# Patient Record
Sex: Male | Born: 1950 | Race: Black or African American | Hispanic: No | Smoking: Former smoker
Health system: Southern US, Community
[De-identification: ages and names within clinical notes are randomized; demographics above are authoritative.]

## PROBLEM LIST (undated history)

## (undated) DIAGNOSIS — M1990 Unspecified osteoarthritis, unspecified site: Secondary | ICD-10-CM

## (undated) DIAGNOSIS — K219 Gastro-esophageal reflux disease without esophagitis: Secondary | ICD-10-CM

## (undated) DIAGNOSIS — J3489 Other specified disorders of nose and nasal sinuses: Secondary | ICD-10-CM

## (undated) DIAGNOSIS — I10 Essential (primary) hypertension: Secondary | ICD-10-CM

## (undated) DIAGNOSIS — H269 Unspecified cataract: Secondary | ICD-10-CM

## (undated) DIAGNOSIS — C801 Malignant (primary) neoplasm, unspecified: Secondary | ICD-10-CM

## (undated) DIAGNOSIS — E785 Hyperlipidemia, unspecified: Secondary | ICD-10-CM

## (undated) HISTORY — DX: Essential (primary) hypertension: I10

## (undated) HISTORY — DX: Hyperlipidemia, unspecified: E78.5

## (undated) HISTORY — PX: WISDOM TOOTH EXTRACTION: SHX21

## (undated) HISTORY — PX: TONSILLECTOMY AND ADENOIDECTOMY: SUR1326

---

## 2015-01-15 DIAGNOSIS — R7301 Impaired fasting glucose: Secondary | ICD-10-CM | POA: Diagnosis not present

## 2015-01-15 DIAGNOSIS — Z0000 Encounter for general adult medical examination without abnormal findings: Secondary | ICD-10-CM | POA: Diagnosis not present

## 2015-01-15 DIAGNOSIS — I10 Essential (primary) hypertension: Secondary | ICD-10-CM | POA: Diagnosis not present

## 2015-01-15 DIAGNOSIS — N400 Enlarged prostate without lower urinary tract symptoms: Secondary | ICD-10-CM | POA: Diagnosis not present

## 2015-01-15 LAB — PSA

## 2015-09-15 ENCOUNTER — Other Ambulatory Visit: Payer: Self-pay | Admitting: Unknown Physician Specialty

## 2015-09-15 MED ORDER — LISINOPRIL-HYDROCHLOROTHIAZIDE 20-12.5 MG PO TABS: 1.0000 | ORAL_TABLET | Freq: Every day | ORAL | Status: DC

## 2015-09-15 NOTE — Telephone Encounter (Signed)
Needs f/u.

## 2015-09-15 NOTE — Telephone Encounter (Signed)
Pt needs refill for lisinopril sent to [facility] court

## 2015-09-15 NOTE — Telephone Encounter (Signed)
Called and scheduled patient an appointment for 10/10/15.

## 2015-09-15 NOTE — Telephone Encounter (Signed)
Patient was last seen 01/15/15, practice partner number is [id number], and pharmacy is [facility].

## 2015-09-25 DIAGNOSIS — I10 Essential (primary) hypertension: Secondary | ICD-10-CM | POA: Insufficient documentation

## 2015-09-25 DIAGNOSIS — R338 Other retention of urine: Secondary | ICD-10-CM

## 2015-09-25 DIAGNOSIS — R7301 Impaired fasting glucose: Secondary | ICD-10-CM | POA: Insufficient documentation

## 2015-09-25 DIAGNOSIS — N401 Enlarged prostate with lower urinary tract symptoms: Secondary | ICD-10-CM | POA: Insufficient documentation

## 2015-09-26 ENCOUNTER — Other Ambulatory Visit: Payer: Self-pay | Admitting: Unknown Physician Specialty

## 2015-09-27 NOTE — Telephone Encounter (Signed)
Jared Herrera just approved this Rx earlier this month Please resolve her prescription with pharmacy Thank you

## 2015-09-29 NOTE — Telephone Encounter (Signed)
Called pharmacy and they stated that they did get the prescription on 09/15/15 for #30 with 1 refill.

## 2015-10-10 ENCOUNTER — Ambulatory Visit (INDEPENDENT_AMBULATORY_CARE_PROVIDER_SITE_OTHER): Payer: Medicare Other | Admitting: Unknown Physician Specialty

## 2015-10-10 ENCOUNTER — Encounter: Payer: Self-pay | Admitting: Unknown Physician Specialty

## 2015-10-10 VITALS — BP 136/87 | HR 96 | Temp 98.3°F | Ht 68.0 in | Wt 211.4 lb

## 2015-10-10 DIAGNOSIS — N401 Enlarged prostate with lower urinary tract symptoms: Secondary | ICD-10-CM

## 2015-10-10 DIAGNOSIS — I10 Essential (primary) hypertension: Secondary | ICD-10-CM | POA: Diagnosis not present

## 2015-10-10 DIAGNOSIS — Z23 Encounter for immunization: Secondary | ICD-10-CM | POA: Diagnosis not present

## 2015-10-10 DIAGNOSIS — N400 Enlarged prostate without lower urinary tract symptoms: Secondary | ICD-10-CM

## 2015-10-10 DIAGNOSIS — R972 Elevated prostate specific antigen [PSA]: Secondary | ICD-10-CM

## 2015-10-10 DIAGNOSIS — R338 Other retention of urine: Secondary | ICD-10-CM

## 2015-10-10 MED ORDER — LISINOPRIL-HYDROCHLOROTHIAZIDE 20-12.5 MG PO TABS: 1.0000 | ORAL_TABLET | Freq: Every day | ORAL | Status: DC

## 2015-10-10 NOTE — Progress Notes (Addendum)
   BP 136/87 mmHg  Pulse 96  Temp(Src) 98.3 F (36.8 C)  Ht 5\' 8"  (1.727 m)  Wt 211 lb 6.4 oz (95.89 kg)  BMI 32.15 kg/m2  SpO2 97%   Subjective:    Patient ID: Jared Herrera, male    DOB: Jan 16, 1951, 64 y.o.   MRN: [id number]  HPI: Jared Herrera is a 64 y.o. male  Chief Complaint  Patient presents with  . Hypertension   Hypertension Using medications without difficulty Average home BP: Does not check  No problems or lightheadedness No chest pain with exertion or shortness of breath No Edema  Elevated PSA Noted last visit.  Lost to 6 month f/u   Relevant past medical, surgical, family and social history reviewed and updated as indicated. Interim medical history since our last visit reviewed. Allergies and medications reviewed and updated.  Review of Systems  Per HPI unless specifically indicated above     Objective:    BP 136/87 mmHg  Pulse 96  Temp(Src) 98.3 F (36.8 C)  Ht 5\' 8"  (1.727 m)  Wt 211 lb 6.4 oz (95.89 kg)  BMI 32.15 kg/m2  SpO2 97%  Wt Readings from Last 3 Encounters:  10/10/15 211 lb 6.4 oz (95.89 kg)  01/15/15 219 lb (99.338 kg)    Physical Exam  Constitutional: He is oriented to person, place, and time. He appears well-developed and well-nourished. No distress.  HENT:  Head: Normocephalic and atraumatic.  Eyes: Conjunctivae and lids are normal. Right eye exhibits no discharge. Left eye exhibits no discharge. No scleral icterus.  Neck: Normal range of motion. Neck supple. No JVD present. Carotid bruit is not present.  Cardiovascular: Normal rate, regular rhythm and normal heart sounds.   Pulmonary/Chest: Effort normal and breath sounds normal. No respiratory distress.  Abdominal: Normal appearance. There is no splenomegaly or hepatomegaly.  Musculoskeletal: Normal range of motion.  Neurological: He is alert and oriented to person, place, and time.  Skin: Skin is warm, dry and intact. No rash noted. No pallor.  Psychiatric: He has  a normal mood and affect. His behavior is normal. Judgment and thought content normal.      Assessment & Plan:   Problem List Items Addressed This Visit      Unprioritized   Hypertension    Stable, continue present medications.        Relevant Medications   lisinopril-hydrochlorothiazide (PRINZIDE,ZESTORETIC) 20-12.5 MG tablet   BPH (benign prostatic hypertrophy) with urinary retention    Recheck PSA       Other Visit Diagnoses    Immunization due    -  Primary    Relevant Orders    Flu Vaccine QUAD 36+ mos IM (Completed)    Elevated PSA        Relevant Orders    PSA        Follow up plan: Return in about 4 months (around 02/08/2016) for physical.

## 2015-10-10 NOTE — Assessment & Plan Note (Signed)
Stable, continue present medications.   

## 2015-10-10 NOTE — Addendum Note (Signed)
Addended by: Kathrine Haddock on: 10/10/2015 10:48 AM   Modules accepted: Orders, SmartSet

## 2015-10-10 NOTE — Assessment & Plan Note (Signed)
Recheck PSA 

## 2015-10-11 LAB — PSA: PROSTATE SPECIFIC AG, SERUM: 6.5 ng/mL — AB (ref 0.0–4.0)

## 2015-10-14 ENCOUNTER — Other Ambulatory Visit: Payer: Self-pay | Admitting: Unknown Physician Specialty

## 2015-10-14 DIAGNOSIS — R972 Elevated prostate specific antigen [PSA]: Secondary | ICD-10-CM

## 2015-10-14 NOTE — Progress Notes (Signed)
Refer to urology fr elevated PSA

## 2015-10-14 NOTE — Addendum Note (Signed)
Addended by: Kathrine Haddock on: 10/14/2015 10:33 AM   Modules accepted: Miquel Dunn

## 2015-10-15 ENCOUNTER — Encounter: Payer: Self-pay | Admitting: Urology

## 2015-10-15 ENCOUNTER — Ambulatory Visit (INDEPENDENT_AMBULATORY_CARE_PROVIDER_SITE_OTHER): Payer: Medicare Other | Admitting: Urology

## 2015-10-15 VITALS — BP 126/88 | HR 80 | Temp 97.8°F | Resp 16 | Ht 68.0 in | Wt 213.0 lb

## 2015-10-15 DIAGNOSIS — R972 Elevated prostate specific antigen [PSA]: Secondary | ICD-10-CM | POA: Diagnosis not present

## 2015-10-15 LAB — URINALYSIS, COMPLETE
Bilirubin, UA: NEGATIVE
Glucose, UA: NEGATIVE
Ketones, UA: NEGATIVE
LEUKOCYTES UA: NEGATIVE
Nitrite, UA: NEGATIVE
PH UA: 5.5 (ref 5.0–7.5)
PROTEIN UA: NEGATIVE
Specific Gravity, UA: 1.025 (ref 1.005–1.030)
Urobilinogen, Ur: 0.2 mg/dL (ref 0.2–1.0)

## 2015-10-15 LAB — MICROSCOPIC EXAMINATION

## 2015-10-15 NOTE — Progress Notes (Signed)
10/15/2015 11:44 AM   Jared Herrera [birth year] [id number]  Referring provider: Kathrine Haddock, NP [place], [place] [address]  Chief Complaint  Patient presents with  . Elevated PSA    new Patient    HPI: [name] with elevated PSA to 6.5 from 4.6 only 9 months ago.  He denies a history of previous prostate biopsies. He denies a personal history of known elevated PSA or workup of this.  No personal history of abnormal rectal exams per patient.  No family history of prostate cancer.    Denies any urinary complaints including no hematuria, dysuria, frequency, urgency, sensation of complete bladder emtpying or nocturia.  Good urinary flow.     No weight loss or bone pain.  He does take baby aspirin for prophylaxis.   PMH: Past Medical History  Diagnosis Date  . Hypertension     Surgical History: Past Surgical History  Procedure Laterality Date  . Tonsillectomy and adenoidectomy      Home Medications:    Medication List       This list is accurate as of: 10/15/15 11:44 AM.  Always use your most recent med list.               aspirin 81 MG tablet  Take 81 mg by mouth daily.     lisinopril-hydrochlorothiazide 20-12.5 MG tablet  Commonly known as:  PRINZIDE,ZESTORETIC  Take 1 tablet by mouth daily.     loratadine 10 MG tablet  Commonly known as:  CLARITIN  Take 10 mg by mouth daily.     multivitamin tablet  Take 1 tablet by mouth daily.     ranitidine 150 MG tablet  Commonly known as:  ZANTAC  Take 150 mg by mouth daily.        Allergies: No Known Allergies  Family History: Family History  Problem Relation Age of Onset  . Heart disease Brother   . Hypertension Brother   . Hypertension Mother   . Kidney disease Mother   . Cancer Sister     thyroid  . Lupus Daughter   . Kidney disease Daughter     Social History:  reports that he has quit smoking. He has never used smokeless tobacco. He reports that he drinks alcohol. He reports that  he does not use illicit drugs.  ROS: UROLOGY Frequent Urination?: No Hard to postpone urination?: No Burning/pain with urination?: No Get up at night to urinate?: No Leakage of urine?: No Urine stream starts and stops?: No Trouble starting stream?: No Do you have to strain to urinate?: No Blood in urine?: No Urinary tract infection?: No Sexually transmitted disease?: No Injury to kidneys or bladder?: No Painful intercourse?: No Weak stream?: No Erection problems?: No Penile pain?: No  Gastrointestinal Nausea?: No Vomiting?: No Indigestion/heartburn?: No Diarrhea?: No Constipation?: No  Constitutional Fever: No Night sweats?: No Weight loss?: No Fatigue?: No  Skin Skin rash/lesions?: No Itching?: No  Eyes Blurred vision?: No Double vision?: No  Ears/Nose/Throat Sore throat?: No Sinus problems?: Yes  Hematologic/Lymphatic Swollen glands?: No Easy bruising?: No  Cardiovascular Leg swelling?: No Chest pain?: No  Respiratory Cough?: No Shortness of breath?: No  Endocrine Excessive thirst?: No  Musculoskeletal Back pain?: No Joint pain?: Yes  Neurological Headaches?: No Dizziness?: No  Psychologic Depression?: No Anxiety?: No  Physical Exam: BP 126/88 mmHg  Pulse 80  Temp(Src) 97.8 F (36.6 C)  Resp 16  Ht 5\' 8"  (1.727 m)  Wt 213 lb (96.616 kg)  BMI 32.39 kg/m2  Constitutional:  Alert and oriented, No acute distress. HEENT: [place] AT, moist mucus membranes.  Trachea midline, no masses. Cardiovascular: No clubbing, cyanosis, or edema. Respiratory: Normal respiratory effort, no increased work of breathing. GI: Abdomen is soft, nontender, nondistended, no abdominal masses GU: No CVA tenderness.  Recta: Normal sphincter tone, no hemorrhoids.  Enlarged 40+ cc gland, rubbery, no nodules.    Skin: No rashes, bruises or suspicious lesions. Lymph: No cervical or inguinal adenopathy. Neurologic: Grossly intact, no focal deficits, moving all 4  extremities. Psychiatric: Normal mood and affect.  Laboratory Data:  Lab Results  Component Value Date   PSA 6.5* 10/10/2015   PS[facility] from [facility] 01/15/2015    Urinalysis UA negative  Pertinent Imaging: n/a  Assessment & Plan:  A 73-year-old African-American male with elevated PSA to 6.5 up from previous 4.6 9 months ago.  DRE consistent with BPH, no nodules.  1. Elevated PSA  We reviewed the implications of an elevated PSA and the uncertainty surrounding it. In general, a man's PSA increases with age and is produced by both normal and cancerous prostate tissue. Differential for elevated PSA is BPH, prostate cancer, infection, recent intercourse/ejaculation, prostate infarction, recent urethroscopic manipulation (foley placement/cystoscopy) and prostatitis. Management of an elevated PSA can include observation or prostate biopsy and wediscussed this in detail. We discussed that indications for prostate biopsy are defined by age and race specific PSA cutoffs as well as a PSA velocity of 0.75/year.  We discussed prostate biopsy in detail including the procedure itself, the risks of blood in the urine, stool, and ejaculate, serious infection, and discomfort. He is willing to proceed with this as discussed.  Plan for repeat PSA today, will call with results.  If remains elevated, recommend f/u prostate biopsy.    - Urinalysis, Complete - PSA   Return in about 2 weeks (around 10/29/2015) for  prostate biopsy.  Hollice Espy, MD  [place] [address] [place], [place] [address] [phone]  I spent 30 min with this patient of which greater than 50% was spent in counseling and coordination of care with the patient.   All of his questions were answered in detail.

## 2015-10-15 NOTE — Patient Instructions (Signed)

## 2015-10-16 ENCOUNTER — Telehealth: Payer: Self-pay

## 2015-10-16 LAB — PSA: Prostate Specific Ag, Serum: 6.2 ng/mL — ABNORMAL HIGH (ref 0.0–4.0)

## 2015-10-16 NOTE — Telephone Encounter (Signed)
-----   Message from Hollice Espy, MD sent at 10/16/2015  8:01 AM EST ----- Please let this gentleman know we are going to proceed with biopsy as planned.  Repeat PSA 6.2.    Hollice Espy, MD

## 2015-10-16 NOTE — Telephone Encounter (Signed)
Spoke with pt in reference to PSA and prostate bx. Pt voiced understanding.  

## 2015-11-11 ENCOUNTER — Ambulatory Visit (INDEPENDENT_AMBULATORY_CARE_PROVIDER_SITE_OTHER): Payer: Medicare Other | Admitting: Urology

## 2015-11-11 ENCOUNTER — Other Ambulatory Visit: Payer: Self-pay | Admitting: Urology

## 2015-11-11 ENCOUNTER — Encounter: Payer: Self-pay | Admitting: Urology

## 2015-11-11 VITALS — BP 171/88 | HR 103 | Temp 97.9°F | Ht 69.0 in | Wt 212.6 lb

## 2015-11-11 DIAGNOSIS — R972 Elevated prostate specific antigen [PSA]: Secondary | ICD-10-CM | POA: Diagnosis not present

## 2015-11-11 DIAGNOSIS — C61 Malignant neoplasm of prostate: Secondary | ICD-10-CM | POA: Diagnosis not present

## 2015-11-11 MED ORDER — LEVOFLOXACIN 500 MG PO TABS: 500.0000 mg | ORAL_TABLET | Freq: Once | ORAL | Status: DC

## 2015-11-11 MED ORDER — GENTAMICIN SULFATE 40 MG/ML IJ SOLN: 80.0000 mg | Freq: Once | INTRAMUSCULAR | Status: DC

## 2015-11-11 NOTE — Progress Notes (Signed)
12:00 PM  11/11/2015   Jared Herrera 31-May-1951 [id number]  Referring provider: Kathrine Haddock, NP [place], [place] [address]  Chief Complaint  Patient presents with  . Biopsy    Prostate Biopsy    HPI: [name] with elevated PSA who presents to the office today for a prostate biopsy.   His PSA was noted to rise to 6.5 from 4.6 only 9 months ago. Repeat PSA 6.2.   He denies a history of previous prostate biopsies. He denies a personal history of known elevated PSA or workup of this.  No personal history of abnormal rectal exams per patient.  No family history of prostate cancer.    Denies any urinary complaints including no hematuria, dysuria, frequency, urgency, sensation of complete bladder emtpying or nocturia.  Good urinary flow.     No weight loss or bone pain.  He does take baby aspirin for prophylaxis.   PMH: Past Medical History  Diagnosis Date  . Hypertension     Surgical History: Past Surgical History  Procedure Laterality Date  . Tonsillectomy and adenoidectomy      Home Medications:    Medication List       This list is accurate as of: 11/11/15 12:00 PM.  Always use your most recent med list.               aspirin 81 MG tablet  Take 81 mg by mouth daily. Reported on 11/11/2015     lisinopril-hydrochlorothiazide 20-12.5 MG tablet  Commonly known as:  PRINZIDE,ZESTORETIC  Take 1 tablet by mouth daily.     loratadine 10 MG tablet  Commonly known as:  CLARITIN  Take 10 mg by mouth daily.     multivitamin tablet  Take 1 tablet by mouth daily. Reported on 11/11/2015     ranitidine 150 MG tablet  Commonly known as:  ZANTAC  Take 150 mg by mouth daily.        Allergies: No Known Allergies  Family History: Family History  Problem Relation Age of Onset  . Heart disease Brother   . Hypertension Brother   . Hypertension Mother   . Kidney disease Mother   . Cancer Sister     thyroid  . Lupus Daughter   . Kidney disease  Daughter     Social History:  reports that he has quit smoking. He has never used smokeless tobacco. He reports that he drinks alcohol. He reports that he does not use illicit drugs.  ROS: UROLOGY Frequent Urination?: No Hard to postpone urination?: No Burning/pain with urination?: No Get up at night to urinate?: No Leakage of urine?: No Urine stream starts and stops?: No Trouble starting stream?: No Do you have to strain to urinate?: No Blood in urine?: No Urinary tract infection?: No Sexually transmitted disease?: No Injury to kidneys or bladder?: No Painful intercourse?: No Weak stream?: No Erection problems?: No Penile pain?: No  Gastrointestinal Nausea?: No Vomiting?: No Indigestion/heartburn?: No Diarrhea?: Yes (x 5 days ) Constipation?: No  Constitutional Fever: No Night sweats?: No Weight loss?: No Fatigue?: Yes  Skin Skin rash/lesions?: No Itching?: No  Eyes Blurred vision?: No Double vision?: No  Ears/Nose/Throat Sore throat?: No Sinus problems?: No  Hematologic/Lymphatic Swollen glands?: No Easy bruising?: No  Cardiovascular Leg swelling?: No Chest pain?: No  Respiratory Cough?: No Shortness of breath?: No  Endocrine Excessive thirst?: No  Musculoskeletal Back pain?: No Joint pain?: Yes  Neurological Headaches?: No Dizziness?: No  Psychologic Depression?:  No Anxiety?: No  Physical Exam: BP 171/88 mmHg  Pulse 103  Temp(Src) 97.9 F (36.6 C) (Oral)  Ht 5\' 9"  (1.753 m)  Wt 212 lb 9.6 oz (96.435 kg)  BMI 31.38 kg/m2  Constitutional:  Alert and oriented, No acute distress. HEENT: [place] AT, moist mucus membranes.  Trachea midline, no masses. Cardiovascular: No clubbing, cyanosis, or edema. Respiratory: Normal respiratory effort, no increased work of breathing. GI: Abdomen is soft, nontender, nondistended, no abdominal masses GU: No CVA tenderness.  Recta: Normal sphincter tone, no hemorrhoids.  Enlarged 40+ cc gland, rubbery,  no nodules.    Skin: No rashes, bruises or suspicious lesions. Neurologic: Grossly intact, no focal deficits, moving all 4 extremities. Psychiatric: Normal mood and affect.  Laboratory Data:  Lab Results  Component Value Date   PSA from [facility] 01/15/2015    Prostate Biopsy Procedure   Informed consent was obtained after discussing risks/benefits of the procedure.  A time out was performed to ensure correct patient identity.  Pre-Procedure: - Last PSA Level:  Lab Results  Component Value Date   [facility] 01/15/2015   - Gentamicin given prophylactically - Levaquin 500 mg administered PO -Transrectal Ultrasound performed revealing a 43 gm prostate -No median lobe noted -Innumerable tiny hypodensities throughout the prostate bilaterally  Procedure: - Prostate block performed using 10 cc 1% lidocaine and biopsies taken from sextant areas, a total of 12 under ultrasound guidance.  Post-Procedure: - Patient tolerated the procedure well - He was counseled to seek immediate medical attention if experiences any severe pain, significant bleeding, or fevers - Return in one week to discuss biopsy results   Pertinent Imaging: n/a  Assessment & Plan:  65 year-old African-American male with elevated PSA to 6.5 up from previous 4.6 9 months ago.  DRE consistent with BPH, no nodules.  Status post uncomplicated prostate biopsy today.  1. Elevated PSA Warning symptoms discussed Return to care in 1 week for biopsy results  Hollice Espy, MD  [place] [address], [place], [place] [address] [phone]

## 2015-11-13 ENCOUNTER — Telehealth: Payer: Self-pay | Admitting: Urology

## 2015-11-13 NOTE — Telephone Encounter (Signed)
[facility] called to inform us that urine specimen leaked a little in transit.  They were able to run tests, but wanted to make [place] aware.

## 2015-11-18 LAB — PATHOLOGY REPORT

## 2015-11-25 ENCOUNTER — Ambulatory Visit (INDEPENDENT_AMBULATORY_CARE_PROVIDER_SITE_OTHER): Payer: Medicare Other | Admitting: Urology

## 2015-11-25 ENCOUNTER — Encounter: Payer: Self-pay | Admitting: Urology

## 2015-11-25 ENCOUNTER — Other Ambulatory Visit: Payer: Medicare Other

## 2015-11-25 VITALS — BP 163/93 | HR 130

## 2015-11-25 DIAGNOSIS — C61 Malignant neoplasm of prostate: Secondary | ICD-10-CM | POA: Diagnosis not present

## 2015-11-25 NOTE — Progress Notes (Signed)
4:30 PM  11/25/2015   Jared Herrera Aug 03, 1951 [id number]  Referring provider: Kathrine Haddock, NP [place], [place] [address]  Chief Complaint  Patient presents with  . Prostate Cancer  . Results    HPI: [name] who returns today for follow up following prostate biopsy to discuss results.  His PSA was noted to rise to 6.5 from 4.6 only 9 months ago. Repeat PSA 6.2.   Prostate biopsy showed Gleason 3+3 in 2/12 cores up to 7% at right latera base and left mid prostate.  TRUS volume 43 g.  Rectal exam unremarkable.    No issues following biopsy.     No family history of prostate cancer.    Minimal voiding symptoms, IPSS 1/1.  No ED, SHIM 19/25.  PMH: Past Medical History  Diagnosis Date  . Hypertension     Surgical History: Past Surgical History  Procedure Laterality Date  . Tonsillectomy and adenoidectomy      Home Medications:    Medication List       This list is accurate as of: 11/25/15  4:30 PM.  Always use your most recent med list.               aspirin 81 MG tablet  Take 81 mg by mouth daily. Reported on 11/11/2015     lisinopril-hydrochlorothiazide 20-12.5 MG tablet  Commonly known as:  PRINZIDE,ZESTORETIC  Take 1 tablet by mouth daily.     loratadine 10 MG tablet  Commonly known as:  CLARITIN  Take 10 mg by mouth daily.     multivitamin tablet  Take 1 tablet by mouth daily. Reported on 11/11/2015     ranitidine 150 MG tablet  Commonly known as:  ZANTAC  Take 150 mg by mouth daily.        Allergies: No Known Allergies  Family History: Family History  Problem Relation Age of Onset  . Heart disease Brother   . Hypertension Brother   . Hypertension Mother   . Kidney disease Mother   . Cancer Sister     thyroid  . Lupus Daughter   . Kidney disease Daughter     Social History:  reports that he has quit smoking. He has never used smokeless tobacco. He reports that he drinks alcohol. He reports that he does not use illicit  drugs.   Physical Exam: BP 163/93 mmHg  Pulse 130  Constitutional:  Alert and oriented, No acute distress.  Presents today with wife, daughter, and granddaughter.   HEENT: [place] AT, moist mucus membranes.  Trachea midline, no masses. Cardiovascular: No clubbing, cyanosis, or edema. Respiratory: Normal respiratory effort, no increased work of breathing..  Skin: No rashes, bruises or suspicious lesions. Neurologic: Grossly intact, no focal deficits, moving all 4 extremities. Psychiatric: Normal mood and affect.   Pertinent Imaging: n/a  Assessment & Plan:    1. Prostate cancer [facility]) The patient was counseled about the natural history of prostate cancer and the standard treatment options that are available for prostate cancer. It was explained to him how his age and life expectancy, clinical stage, Gleason score, and PSA affect his prognosis, the decision to proceed with additional staging studies, as well as how that information influences recommended treatment strategies. We discussed the roles for active surveillance, radiation therapy, surgical therapy, androgen deprivation, as well as ablative therapy options for the treatment of prostate cancer as appropriate to his individual cancer situation. We discussed the risks and benefits of these options with  regard to their impact on cancer control and also in terms of potential adverse events, complications, and impact on quality of life particularly related to urinary, bowel, and sexual function. The patient was encouraged to ask questions throughout the discussion today and all questions were answered to his stated satisfaction. In addition, the patient was provided with and/or directed to appropriate resources and literature for further education about prostate cancer treatment options.  The book "100 questions" was given to the patient along with Dr. Priscille Loveless prostate cancer healthy living book.  Given his low risk, low volume disease, and very  high likelihood of organ confined prostate cancer, his overall prognosis is excellent. No further staging is recommended per NCCN guidelines.    I have recommended active surveillance today. We will check PSA/DRE every 3-4 months and consider repeat biopsy 1 year. The patient and his family are agreeable with this plan.  - PSA; Future  Return in about 4 months (around 03/24/2016) for PSA/ DRE.   Hollice Espy, MD  [place] [address], [place], [place] [address] [phone]  I spent 25 min with this patient of which greater than 50% was spent in counseling and coordination of care with the patient.

## 2015-11-26 ENCOUNTER — Encounter: Payer: Self-pay | Admitting: Urology

## 2015-12-10 HISTORY — PX: PROSTATE BIOPSY: SHX241

## 2016-01-16 ENCOUNTER — Encounter: Payer: Self-pay | Admitting: Unknown Physician Specialty

## 2016-01-16 ENCOUNTER — Ambulatory Visit (INDEPENDENT_AMBULATORY_CARE_PROVIDER_SITE_OTHER): Payer: Medicare Other | Admitting: Unknown Physician Specialty

## 2016-01-16 VITALS — BP 153/92 | HR 121 | Temp 97.8°F | Ht 68.0 in | Wt 212.0 lb

## 2016-01-16 DIAGNOSIS — I10 Essential (primary) hypertension: Secondary | ICD-10-CM

## 2016-01-16 DIAGNOSIS — Z0000 Encounter for general adult medical examination without abnormal findings: Secondary | ICD-10-CM | POA: Diagnosis not present

## 2016-01-16 DIAGNOSIS — Z8546 Personal history of malignant neoplasm of prostate: Secondary | ICD-10-CM | POA: Insufficient documentation

## 2016-01-16 DIAGNOSIS — C61 Malignant neoplasm of prostate: Secondary | ICD-10-CM

## 2016-01-16 DIAGNOSIS — Z23 Encounter for immunization: Secondary | ICD-10-CM

## 2016-01-16 MED ORDER — LISINOPRIL-HYDROCHLOROTHIAZIDE 20-25 MG PO TABS: 1.0000 | ORAL_TABLET | Freq: Every day | ORAL | Status: DC

## 2016-01-16 NOTE — Assessment & Plan Note (Signed)
Not to goal 

## 2016-01-16 NOTE — Progress Notes (Signed)
BP 153/92 mmHg  Pulse 121  Temp(Src) 97.8 F (36.6 C)  Ht 5\' 8"  (1.727 m)  Wt 212 lb (96.163 kg)  BMI 32.24 kg/m2  SpO2 97%   Subjective:    Patient ID: Jared Herrera, male    DOB: 01-15-1951, 65 y.o.   MRN: [id number]  HPI: Jared Herrera is a 65 y.o. male  Chief Complaint  Patient presents with  . Annual Exam    Medicare Wellness; he is going to check and see if his insurance covers the colonoscopy   Fall Risk  01/16/2016 10/10/2015  Falls in the past year? No No   Depression screen [facility] 2/9 01/16/2016 10/10/2015  Decreased Interest 0 0  Down, Depressed, Hopeless 0 0  PHQ - 2 Score 0 0    Functional Status Survey: Is the patient deaf or have difficulty hearing?: No Does the patient have difficulty seeing, even when wearing glasses/contacts?: No Does the patient have difficulty concentrating, remembering, or making decisions?: No Does the patient have difficulty walking or climbing stairs?: No Does the patient have difficulty dressing or bathing?: No Does the patient have difficulty doing errands alone such as visiting a doctor's office or shopping?: No  Past Medical History  Diagnosis Date  . Hypertension    Social History   Social History  . Marital Status: Legally Separated    Spouse Name: N/A  . Number of Children: N/A  . Years of Education: N/A   Occupational History  . Not on file.   Social History Main Topics  . Smoking status: Former Smoker -- 0.50 packs/day for 8 years    Types: Cigarettes    Quit date: 10/11/1978  . Smokeless tobacco: Never Used  . Alcohol Use: 0.0 oz/week    0 Standard drinks or equivalent per week     Comment: occasional glass of wine  . Drug Use: No  . Sexual Activity: No   Other Topics Concern  . Not on file   Social History Narrative   Family History  Problem Relation Age of Onset  . Heart disease Brother   . Hypertension Brother   . Hypertension Mother   . Kidney disease Mother   . Cancer Sister    thyroid  . Lupus Daughter   . Kidney disease Daughter   . COPD Neg Hx   . Diabetes Neg Hx   . Stroke Neg Hx     Hypertension Using medications without difficulty Average home BP: Not checking.  It was a little high when he got his prostate checked.    No problems or lightheadedness No chest pain with exertion or shortness of breath No Edema  Mini cog was negative  Relevant past medical, surgical, family and social history reviewed and updated as indicated. Interim medical history since our last visit reviewed. Allergies and medications reviewed and updated.  Review of Systems  Per HPI unless specifically indicated above     Objective:    BP 153/92 mmHg  Pulse 121  Temp(Src) 97.8 F (36.6 C)  Ht 5\' 8"  (1.727 m)  Wt 212 lb (96.163 kg)  BMI 32.24 kg/m2  SpO2 97%  Wt Readings from Last 3 Encounters:  01/16/16 212 lb (96.163 kg)  11/11/15 212 lb 9.6 oz (96.435 kg)  10/15/15 213 lb (96.616 kg)    Physical Exam  Constitutional: He is oriented to person, place, and time. He appears well-developed and well-nourished.  HENT:  Head: Normocephalic.  Right Ear: Tympanic membrane, external ear and ear canal  normal.  Left Ear: Tympanic membrane, external ear and ear canal normal.  Mouth/Throat: Uvula is midline, oropharynx is clear and moist and mucous membranes are normal.  Eyes: Pupils are equal, round, and reactive to light.  Cardiovascular: Normal rate, regular rhythm and normal heart sounds.  Exam reveals no gallop and no friction rub.   No murmur heard. Pulmonary/Chest: Effort normal and breath sounds normal. No respiratory distress.  Abdominal: Soft. Bowel sounds are normal. He exhibits no distension. There is no tenderness.  Musculoskeletal: Normal range of motion.  Neurological: He is alert and oriented to person, place, and time. He has normal reflexes.  Skin: Skin is warm and dry.  Psychiatric: He has a normal mood and affect. His behavior is normal. Judgment and  thought content normal.      Assessment & Plan:   Problem List Items Addressed This Visit      Unprioritized   Hypertension    Not to goal      Relevant Medications   lisinopril-hydrochlorothiazide (PRINZIDE,ZESTORETIC) 20-25 MG tablet   Other Relevant Orders   Comprehensive metabolic panel   Lipid Panel w/o Chol/HDL Ratio   Prostate cancer ([place]) - Primary    Other Visit Diagnoses    Annual physical exam        Relevant Orders    Ambulatory referral to Gastroenterology    Immunization due        Relevant Orders    Pneumococcal polysaccharide vaccine 23-valent greater than or equal to 2yo subcutaneous/IM        Follow up plan: Return in about 4 weeks (around 02/13/2016).

## 2016-01-17 LAB — COMPREHENSIVE METABOLIC PANEL WITH GFR
ALT: 38 IU/L (ref 0–44)
AST: 29 IU/L (ref 0–40)
Albumin/Globulin Ratio: 1.2 (ref 1.2–2.2)
Albumin: 3.8 g/dL (ref 3.6–4.8)
Alkaline Phosphatase: 82 IU/L (ref 39–117)
BUN/Creatinine Ratio: 19 (ref 10–24)
BUN: 14 mg/dL (ref 8–27)
Bilirubin Total: 0.6 mg/dL (ref 0.0–1.2)
CO2: 23 mmol/L (ref 18–29)
Calcium: 9.4 mg/dL (ref 8.6–10.2)
Chloride: 99 mmol/L (ref 96–106)
Creatinine, Ser: 0.72 mg/dL — ABNORMAL LOW (ref 0.76–1.27)
GFR calc Af Amer: 112 mL/min/1.73
GFR calc non Af Amer: 97 mL/min/1.73
Globulin, Total: 3.3 g/dL (ref 1.5–4.5)
Glucose: 143 mg/dL — ABNORMAL HIGH (ref 65–99)
Potassium: 3.7 mmol/L (ref 3.5–5.2)
Sodium: 140 mmol/L (ref 134–144)
Total Protein: 7.1 g/dL (ref 6.0–8.5)

## 2016-01-17 LAB — LIPID PANEL W/O CHOL/HDL RATIO
Cholesterol, Total: 170 mg/dL (ref 100–199)
HDL: 43 mg/dL (ref >39)
LDL CALC: 112 mg/dL — AB (ref 0–99)
Triglycerides: 75 mg/dL (ref 0–149)
VLDL CHOLESTEROL CAL: 15 mg/dL (ref 5–40)

## 2016-01-19 ENCOUNTER — Encounter: Payer: Self-pay | Admitting: Unknown Physician Specialty

## 2016-01-19 NOTE — Progress Notes (Signed)
Quick Note:  Normal labs if non-fasting. Pt notified through mychart ______

## 2016-02-04 ENCOUNTER — Other Ambulatory Visit: Payer: Self-pay

## 2016-02-04 ENCOUNTER — Telehealth: Payer: Self-pay

## 2016-02-04 NOTE — Telephone Encounter (Signed)
Gastroenterology Pre-Procedure Review  Request Date: 03/23/16 Requesting Physician: Kathrine Haddock, NP  PATIENT REVIEW QUESTIONS: The patient responded to the following health history questions as indicated:    1. Are you having any GI issues? no 2. Do you have a personal history of Polyps? no 3. Do you have a family history of Colon Cancer or Polyps? no 4. Diabetes Mellitus? no 5. Joint replacements in the past 12 months?no 6. Major health problems in the past 3 months?no 7. Any artificial heart valves, MVP, or defibrillator?no    MEDICATIONS & ALLERGIES:    Patient reports the following regarding taking any anticoagulation/antiplatelet therapy:   Plavix, Coumadin, Eliquis, Xarelto, Lovenox, Pradaxa, Brilinta, or Effient? no Aspirin? ASA 81mg   Patient confirms/reports the following medications:  Current Outpatient Prescriptions  Medication Sig Dispense Refill  . aspirin 81 MG tablet Take 81 mg by mouth daily. Reported on 11/11/2015    . lisinopril-hydrochlorothiazide (PRINZIDE,ZESTORETIC) 20-25 MG tablet Take 1 tablet by mouth daily. Increase from 20/12.5 90 tablet 3  . loratadine (CLARITIN) 10 MG tablet Take 10 mg by mouth daily.    . Multiple Vitamin (MULTIVITAMIN) tablet Take 1 tablet by mouth daily. Reported on 11/11/2015    . ranitidine (ZANTAC) 150 MG tablet Take 150 mg by mouth daily.     Current Facility-Administered Medications  Medication Dose Route Frequency Provider Last Rate Last Dose  . gentamicin (GARAMYCIN) injection 80 mg  80 mg Intramuscular Once Hollice Espy, MD      . levofloxacin [facility]) tablet 500 mg  500 mg Oral Once Hollice Espy, MD        Patient confirms/reports the following allergies:  No Known Allergies  No orders of the defined types were placed in this encounter.    AUTHORIZATION INFORMATION Primary Insurance: 1D#: Group #:  Secondary Insurance: 1D#: Group #:  SCHEDULE INFORMATION: Date: 03/23/16 Location: [facility]

## 2016-02-18 ENCOUNTER — Encounter: Payer: Self-pay | Admitting: Unknown Physician Specialty

## 2016-02-18 ENCOUNTER — Ambulatory Visit (INDEPENDENT_AMBULATORY_CARE_PROVIDER_SITE_OTHER): Payer: Medicare Other | Admitting: Unknown Physician Specialty

## 2016-02-18 VITALS — BP 120/70 | HR 111 | Temp 97.9°F | Ht 67.8 in | Wt 207.6 lb

## 2016-02-18 DIAGNOSIS — I10 Essential (primary) hypertension: Secondary | ICD-10-CM

## 2016-02-18 NOTE — Progress Notes (Signed)
BP 120/70 mmHg  Pulse 111  Temp(Src) 97.9 F (36.6 C)  Ht 5' 7.8" (1.722 m)  Wt 207 lb 9.6 oz (94.167 kg)  BMI 31.76 kg/m2  SpO2 97%   Subjective:    Patient ID: Jared Herrera, male    DOB: [birth year], 64 y.o.   MRN: [id number]  HPI: Jared Herrera is a 65 y.o. male  Chief Complaint  Patient presents with  . Hypertension   Hypertension F/u BP after elevated BP Using medications without difficulty Average home BPs: Not checking but planning to start.    No problems or lightheadedness No chest pain with exertion or shortness of breath No Edema  Relevant past medical, surgical, family and social history reviewed and updated as indicated. Interim medical history since our last visit reviewed. Allergies and medications reviewed and updated.  Review of Systems  Per HPI unless specifically indicated above     Objective:    BP 120/70 mmHg  Pulse 111  Temp(Src) 97.9 F (36.6 C)  Ht 5' 7.8" (1.722 m)  Wt 207 lb 9.6 oz (94.167 kg)  BMI 31.76 kg/m2  SpO2 97%  Wt Readings from Last 3 Encounters:  02/18/16 207 lb 9.6 oz (94.167 kg)  01/16/16 212 lb (96.163 kg)  11/11/15 212 lb 9.6 oz (96.435 kg)    Physical Exam  Constitutional: He is oriented to person, place, and time. He appears well-developed and well-nourished. No distress.  HENT:  Head: Normocephalic and atraumatic.  Eyes: Conjunctivae and lids are normal. Right eye exhibits no discharge. Left eye exhibits no discharge. No scleral icterus.  Neck: Normal range of motion. Neck supple. No JVD present. Carotid bruit is not present.  Cardiovascular: Normal rate, regular rhythm and normal heart sounds.   Pulmonary/Chest: Effort normal and breath sounds normal. No respiratory distress.  Abdominal: Normal appearance. There is no splenomegaly or hepatomegaly.  Musculoskeletal: Normal range of motion.  Neurological: He is alert and oriented to person, place, and time.  Skin: Skin is warm, dry and intact. No  rash noted. No pallor.  Psychiatric: He has a normal mood and affect. His behavior is normal. Judgment and thought content normal.    Results for orders placed or performed in visit on 01/16/16  Comprehensive metabolic panel  Result Value Ref Range   Glucose 143 (H) 65 - 99 mg/dL   BUN 14 8 - 27 mg/dL   Creatinine, Ser 0.72 (L) 0.76 - 1.27 mg/dL   GFR calc non Af Amer 97 >59 mL/min/1.73   GFR calc Af Amer 112 >59 mL/min/1.73   BUN/Creatinine Ratio 19 10 - 24   Sodium 140 134 - 144 mmol/L   Potassium 3.7 3.5 - 5.2 mmol/L   Chloride 99 96 - 106 mmol/L   CO2 23 18 - 29 mmol/L   Calcium 9.4 8.6 - 10.2 mg/dL   Total Protein 7.1 6.0 - 8.5 g/dL   Albumin 3.8 3.6 - 4.8 g/dL   Globulin, Total 3.3 1.5 - 4.5 g/dL   Albumin/Globulin Ratio 1.2 1.2 - 2.2   Bilirubin Total 0.6 0.0 - 1.2 mg/dL   Alkaline Phosphatase 82 39 - 117 IU/L   AST 29 0 - 40 IU/L   ALT 38 0 - 44 IU/L  Lipid Panel w/o Chol/HDL Ratio  Result Value Ref Range   Cholesterol, Total 170 100 - 199 mg/dL   Triglycerides 75 0 - 149 mg/dL   HDL 43 >39 mg/dL   VLDL Cholesterol Cal 15 5 - 40 mg/dL  LDL Calculated 112 (H) 0 - 99 mg/dL      Assessment & Plan:   Problem List Items Addressed This Visit      Unprioritized   Hypertension - Primary    Improved.  Continue present medications          Follow up plan: Return in about 6 months (around 08/20/2016).

## 2016-02-18 NOTE — Assessment & Plan Note (Signed)
Improved.  Continue present medications

## 2016-03-22 ENCOUNTER — Telehealth: Payer: Self-pay

## 2016-03-22 ENCOUNTER — Other Ambulatory Visit: Payer: Self-pay

## 2016-03-22 NOTE — Telephone Encounter (Signed)
Patient had called the Endoscopy Unit and told them that he had eaten today. Therefore, Trish from the Endo unit called [place] to speak with the patient. Patient stated that he had misunderstood the instructions. He forgot not to eat solid foods today. I told him that we needed to reschedule his colonoscopy to 04/05/2016 at the [facility]. Patient agreed. I will mail him the instructions from the [facility] so he could call them the day before of his procedure to find out at what time he is needing to arrive for his procedure.

## 2016-03-23 ENCOUNTER — Encounter: Admission: RE | Payer: Self-pay | Source: Ambulatory Visit

## 2016-03-23 ENCOUNTER — Ambulatory Visit: Admission: RE | Admit: 2016-03-23 | Payer: Medicare Other | Source: Ambulatory Visit | Admitting: Gastroenterology

## 2016-03-23 SURGERY — COLONOSCOPY WITH PROPOFOL
Anesthesia: General

## 2016-03-26 ENCOUNTER — Encounter: Payer: Self-pay | Admitting: Urology

## 2016-03-26 ENCOUNTER — Ambulatory Visit (INDEPENDENT_AMBULATORY_CARE_PROVIDER_SITE_OTHER): Payer: Medicare Other | Admitting: Urology

## 2016-03-26 VITALS — BP 125/79 | HR 109 | Ht 69.0 in | Wt 208.0 lb

## 2016-03-26 DIAGNOSIS — C61 Malignant neoplasm of prostate: Secondary | ICD-10-CM

## 2016-03-26 NOTE — Progress Notes (Signed)
10:57 AM  03/26/2016  Glory Buff Schlottman [birth year] [id number]  Referring provider: Kathrine Haddock, NP [place], [place] [address]  Chief Complaint  Patient presents with  . Prostate Cancer    23month follow up    HPI: [name] on active surveillance of prostate cancer dx 10/2015.  His PSA was noted to rise to 6.5 from 4.6 only 9 months ago. Repeat PSA 6.2.   Prostate biopsy showed Gleason 3+3 in 2/12 cores up to 7% at right lateral base and left mid prostate on 1/31/7.  TRUS volume 43 g.  Rectal exam unremarkable.     He returns to the office today for DRE/ PSA.  No family history of prostate cancer.    Minimal voiding symptoms, IPSS 1/1.  No ED, SHIM 19/25.  PMH: Past Medical History  Diagnosis Date  . Hypertension     Surgical History: Past Surgical History  Procedure Laterality Date  . Tonsillectomy and adenoidectomy    . Prostate biopsy  March 2017    showed some cells, but they are slow growing    Home Medications:    Medication List       This list is accurate as of: 03/26/16 10:57 AM.  Always use your most recent med list.               aspirin 81 MG tablet  Take 81 mg by mouth daily. Reported on 11/11/2015     lisinopril-hydrochlorothiazide 20-25 MG tablet  Commonly known as:  PRINZIDE,ZESTORETIC  Take 1 tablet by mouth daily. Increase from 20/12.5     loratadine 10 MG tablet  Commonly known as:  CLARITIN  Take 10 mg by mouth daily.     ranitidine 150 MG tablet  Commonly known as:  ZANTAC  Take 150 mg by mouth daily.     ULTRA MAN PO  Take by mouth daily. 2 tablets daily        Allergies: No Known Allergies  Family History: Family History  Problem Relation Age of Onset  . Heart disease Brother   . Hypertension Brother   . Hypertension Mother   . Kidney disease Mother   . Cancer Sister     thyroid  . Lupus Daughter   . Kidney disease Daughter   . COPD Neg Hx   . Diabetes Neg Hx   . Stroke Neg Hx     Social History:   reports that he quit smoking about 37 years ago. His smoking use included Cigarettes. He has a 4 pack-year smoking history. He has never used smokeless tobacco. He reports that he drinks alcohol. He reports that he does not use illicit drugs.  Psychologic Depression?: No Anxiety?: NoPhysical Exam: BP 125/79 mmHg  Pulse 109  Ht 5\' 9"  (1.753 m)  Wt 208 lb (94.348 kg)  BMI 30.70 kg/m2  Constitutional:  Alert and oriented, No acute distress.  Presents today with wife, daughter, and granddaughter.   HEENT: [place] AT, moist mucus membranes.  Trachea midline, no masses. Cardiovascular: No clubbing, cyanosis, or edema. Respiratory: Normal respiratory effort, no increased work of breathing..  Rectal exam: Normal sphincter tone, 40 cc prostate, possible small nodule at apex but very subtile  Skin: No rashes, bruises or suspicious lesions. Neurologic: Grossly intact, no focal deficits, moving all 4 extremities. Psychiatric: Normal mood and affect.   Pertinent Imaging: n/a  Assessment & Plan:    1. Prostate cancer [facility]) Continue active surveillance for very low risk cT1c prostate cancer  q4 months PSA/ DRE Will call with this PSA result  Encouraged to have PSA prior to next f/u  Reviewed plan for additional surveillance, f/u imaging vs. Repeat biopsy at 1 year or sooner as needed   Return in about 4 months (around 07/26/2016) for f/u PSA/ DRE.   Hollice Espy, MD  [facility] [address], [place], [place] [address] [phone]

## 2016-03-27 LAB — PSA: PROSTATE SPECIFIC AG, SERUM: 6.2 ng/mL — AB (ref 0.0–4.0)

## 2016-03-29 ENCOUNTER — Telehealth: Payer: Self-pay

## 2016-03-29 NOTE — Telephone Encounter (Signed)
-----   Message from Hollice Espy, MD sent at 03/27/2016  1:36 PM EDT ----- PSA stable at 6.2. Great news.  Hollice Espy, MD

## 2016-03-29 NOTE — Telephone Encounter (Signed)
Voicemail not set up.

## 2016-03-31 NOTE — Telephone Encounter (Signed)
Spoke with pt in reference to PSA results. Pt voiced understanding.  

## 2016-04-01 NOTE — Discharge Instructions (Signed)

## 2016-04-05 ENCOUNTER — Ambulatory Visit
Admission: RE | Admit: 2016-04-05 | Discharge: 2016-04-05 | Disposition: A | Discharge status: Home / Self Care | Payer: Medicare Other | Source: Ambulatory Visit | Attending: Gastroenterology | Admitting: Gastroenterology

## 2016-04-05 ENCOUNTER — Ambulatory Visit: Payer: Medicare Other | Admitting: Anesthesiology

## 2016-04-05 ENCOUNTER — Encounter
Admission: RE | Disposition: A | Discharge status: Home / Self Care | Payer: Self-pay | Source: Ambulatory Visit | Attending: Gastroenterology

## 2016-04-05 DIAGNOSIS — Z79899 Other long term (current) drug therapy: Secondary | ICD-10-CM | POA: Insufficient documentation

## 2016-04-05 DIAGNOSIS — D120 Benign neoplasm of cecum: Secondary | ICD-10-CM | POA: Insufficient documentation

## 2016-04-05 DIAGNOSIS — I10 Essential (primary) hypertension: Secondary | ICD-10-CM | POA: Diagnosis not present

## 2016-04-05 DIAGNOSIS — K640 First degree hemorrhoids: Secondary | ICD-10-CM | POA: Diagnosis not present

## 2016-04-05 DIAGNOSIS — Z8546 Personal history of malignant neoplasm of prostate: Secondary | ICD-10-CM | POA: Diagnosis not present

## 2016-04-05 DIAGNOSIS — Z7982 Long term (current) use of aspirin: Secondary | ICD-10-CM | POA: Diagnosis not present

## 2016-04-05 DIAGNOSIS — Z87891 Personal history of nicotine dependence: Secondary | ICD-10-CM | POA: Diagnosis not present

## 2016-04-05 DIAGNOSIS — D124 Benign neoplasm of descending colon: Secondary | ICD-10-CM | POA: Diagnosis not present

## 2016-04-05 DIAGNOSIS — Z1211 Encounter for screening for malignant neoplasm of colon: Secondary | ICD-10-CM | POA: Insufficient documentation

## 2016-04-05 DIAGNOSIS — K5730 Diverticulosis of large intestine without perforation or abscess without bleeding: Secondary | ICD-10-CM | POA: Insufficient documentation

## 2016-04-05 DIAGNOSIS — K219 Gastro-esophageal reflux disease without esophagitis: Secondary | ICD-10-CM | POA: Insufficient documentation

## 2016-04-05 HISTORY — DX: Unspecified cataract: H26.9

## 2016-04-05 HISTORY — PX: COLONOSCOPY WITH PROPOFOL: SHX5780

## 2016-04-05 HISTORY — DX: Other specified disorders of nose and nasal sinuses: J34.89

## 2016-04-05 HISTORY — DX: Gastro-esophageal reflux disease without esophagitis: K21.9

## 2016-04-05 SURGERY — COLONOSCOPY WITH PROPOFOL
Anesthesia: Monitor Anesthesia Care

## 2016-04-05 MED ORDER — LACTATED RINGERS IV SOLN
INTRAVENOUS | Status: DC
  Administered 2016-04-05: 09:00:00 via INTRAVENOUS

## 2016-04-05 MED ORDER — OXYCODONE HCL 5 MG/5ML PO SOLN: 5.0000 mg | Freq: Once | ORAL | Status: DC | PRN

## 2016-04-05 MED ORDER — PROPOFOL 10 MG/ML IV BOLUS
INTRAVENOUS | Status: DC | PRN
  Administered 2016-04-05: 20 mg via INTRAVENOUS
  Administered 2016-04-05 (×2): 50 mg via INTRAVENOUS
  Administered 2016-04-05: 100 mg via INTRAVENOUS

## 2016-04-05 MED ORDER — LIDOCAINE HCL (CARDIAC) 20 MG/ML IV SOLN
INTRAVENOUS | Status: DC | PRN
  Administered 2016-04-05: 50 mg via INTRAVENOUS

## 2016-04-05 MED ORDER — STERILE WATER FOR IRRIGATION IR SOLN
Status: DC | PRN
  Administered 2016-04-05 (×2)

## 2016-04-05 MED ORDER — OXYCODONE HCL 5 MG PO TABS: 5.0000 mg | ORAL_TABLET | Freq: Once | ORAL | Status: DC | PRN

## 2016-04-05 SURGICAL SUPPLY — 23 items
CANISTER SUCT 1200ML W/VALVE (MISCELLANEOUS) ×3 IMPLANT
CLIP HMST 235XBRD CATH ROT (MISCELLANEOUS) IMPLANT
CLIP RESOLUTION 360 11X235 (MISCELLANEOUS)
FCP ESCP3.2XJMB 240X2.8X (MISCELLANEOUS)
FORCEPS BIOP RAD 4 LRG CAP 4 (CUTTING FORCEPS) IMPLANT
FORCEPS BIOP RJ4 240 W/NDL (MISCELLANEOUS)
FORCEPS ESCP3.2XJMB 240X2.8X (MISCELLANEOUS) IMPLANT
GOWN CVR UNV OPN BCK APRN NK (MISCELLANEOUS) ×2 IMPLANT
GOWN ISOL THUMB LOOP REG UNIV (MISCELLANEOUS) ×4
INJECTOR VARIJECT VIN23 (MISCELLANEOUS) IMPLANT
KIT DEFENDO VALVE AND CONN (KITS) IMPLANT
KIT ENDO PROCEDURE OLY (KITS) ×3 IMPLANT
MARKER SPOT ENDO TATTOO 5ML (MISCELLANEOUS) IMPLANT
PAD GROUND ADULT SPLIT (MISCELLANEOUS) IMPLANT
PROBE APC STR FIRE (PROBE) IMPLANT
RETRIEVER NET ROTH 2.5X230 LF (MISCELLANEOUS) ×3 IMPLANT
SNARE SHORT THROW 13M SML OVAL (MISCELLANEOUS) IMPLANT
SNARE SHORT THROW 30M LRG OVAL (MISCELLANEOUS) ×3 IMPLANT
SNARE SNG USE RND 15MM (INSTRUMENTS) IMPLANT
SPOT EX ENDOSCOPIC TATTOO (MISCELLANEOUS)
TRAP ETRAP POLY (MISCELLANEOUS) IMPLANT
VARIJECT INJECTOR VIN23 (MISCELLANEOUS)
WATER STERILE IRR 250ML POUR (IV SOLUTION) ×3 IMPLANT

## 2016-04-05 NOTE — H&P (Signed)
Jared Lame, MD [facility] [address], [place], [place] [address] Phone: [phone] Fax : [phone]  Primary Care Physician:  Kathrine Haddock, NP Primary Gastroenterologist:  Dr. Allen Norris  Pre-Procedure History & Physical: HPI:  Jared Herrera is a 65 y.o. male is here for a screening colonoscopy.   Past Medical History  Diagnosis Date  . Hypertension     controlled on meds  . Sinus drainage   . Cataract     bilateral  . GERD (gastroesophageal reflux disease)     Past Surgical History  Procedure Laterality Date  . Tonsillectomy and adenoidectomy    . Prostate biopsy  March 2017    showed some cells, but they are slow growing  . Wisdom tooth extraction      Prior to Admission medications   Medication Sig Start Date End Date Taking? Authorizing Provider  aspirin 81 MG tablet Take 81 mg by mouth daily. Am, Reported on 11/11/2015   Yes Historical Provider, MD  lisinopril-hydrochlorothiazide (PRINZIDE,ZESTORETIC) 20-25 MG tablet Take 1 tablet by mouth daily. Increase from 20/12.5 Patient taking differently: Take 1 tablet by mouth daily. Am,Increase from 20/12.5 01/16/16  Yes Kathrine Haddock, NP  loratadine (CLARITIN) 10 MG tablet Take 10 mg by mouth daily. am   Yes Historical Provider, MD  ranitidine (ZANTAC) 150 MG tablet Take 150 mg by mouth as needed.    Yes Historical Provider, MD  Specialty Vitamins Products (ULTRA MAN PO) Take by mouth daily. Am,2 tablets daily   Yes Historical Provider, MD    Allergies as of 03/22/2016  . (No Known Allergies)    Family History  Problem Relation Age of Onset  . Heart disease Brother   . Hypertension Brother   . Hypertension Mother   . Kidney disease Mother   . Cancer Sister     thyroid  . Lupus Daughter   . Kidney disease Daughter   . COPD Neg Hx   . Diabetes Neg Hx   . Stroke Neg Hx     Social History   Social History  . Marital Status: Legally Separated    Spouse Name: N/A  . Number of Children: N/A  . Years of  Education: N/A   Occupational History  . Not on file.   Social History Main Topics  . Smoking status: Former Smoker -- 0.50 packs/day for 8 years    Types: Cigarettes    Quit date: 10/11/1978  . Smokeless tobacco: Never Used  . Alcohol Use: 0.0 oz/week    0 Standard drinks or equivalent per week     Comment: occasional glass of wine  . Drug Use: No  . Sexual Activity: No   Other Topics Concern  . Not on file   Social History Narrative    Review of Systems: See HPI, otherwise negative ROS  Physical Exam: BP 122/80 mmHg  Pulse 103  Temp(Src) 97.3 F (36.3 C) (Temporal)  Resp 16  Ht 5\' 9"  (1.753 m)  Wt 202 lb (91.627 kg)  BMI 29.82 kg/m2  SpO2 98% General:   Alert,  pleasant and cooperative in NAD Head:  Normocephalic and atraumatic. Neck:  Supple; no masses or thyromegaly. Lungs:  Clear throughout to auscultation.    Heart:  Regular rate and rhythm. Abdomen:  Soft, nontender and nondistended. Normal bowel sounds, without guarding, and without rebound.   Neurologic:  Alert and  oriented x4;  grossly normal neurologically.  Impression/Plan: Jared Herrera is now here to undergo a screening colonoscopy.  Risks, benefits, and alternatives regarding colonoscopy have been reviewed with the patient.  Questions have been answered.  All parties agreeable.

## 2016-04-05 NOTE — Op Note (Signed)
[facility] Gastroenterology Patient Name: Jared Herrera Procedure Date: 04/05/2016 10:29 AM MRN: [id number] Account #: [id number] Date of Birth: 03/03/1951 Admit Type: Outpatient Age: 65 Room: [facility] OR ROOM 01 Gender: Male Note Status: Finalized Procedure:            Colonoscopy Indications:          Screening for colorectal malignant neoplasm Providers:            Lucilla Lame, MD Referring MD:         Kathrine Haddock, PA (Referring MD) Medicines:            Propofol per Anesthesia Complications:        No immediate complications. Procedure:            Pre-Anesthesia Assessment:                       - Prior to the procedure, a History and Physical was                        performed, and patient medications and allergies were                        reviewed. The patient's tolerance of previous                        anesthesia was also reviewed. The risks and benefits of                        the procedure and the sedation options and risks were                        discussed with the patient. All questions were                        answered, and informed consent was obtained. Prior                        Anticoagulants: The patient has taken no previous                        anticoagulant or antiplatelet agents. ASA Grade                        Assessment: II - A patient with mild systemic disease.                        After reviewing the risks and benefits, the patient was                        deemed in satisfactory condition to undergo the                        procedure.                       After obtaining informed consent, the colonoscope was                        passed under direct vision. Throughout the procedure,  the patient's blood pressure, pulse, and oxygen                        saturations were monitored continuously. The Olympus                        CF-HQ190L Colonoscope (S#. [id number]) was introduced                   through the anus and advanced to the the cecum,                        identified by appendiceal orifice and ileocecal valve.                        The colonoscopy was performed without difficulty. The                        patient tolerated the procedure well. The quality of                        the bowel preparation was excellent. Findings:      The perianal and digital rectal examinations were normal.      A 3 mm polyp was found in the cecum. The polyp was sessile. The polyp       was removed with a cold biopsy forceps. Resection and retrieval were       complete.      A 6 mm polyp was found in the descending colon. The polyp was       pedunculated. The polyp was removed with a cold snare. Resection and       retrieval were complete.      Multiple small-mouthed diverticula were found in the entire colon.      Non-bleeding internal hemorrhoids were found during retroflexion. The       hemorrhoids were Grade I (internal hemorrhoids that do not prolapse). Impression:           - One 3 mm polyp in the cecum, removed with a cold                        biopsy forceps. Resected and retrieved.                       - One 6 mm polyp in the descending colon, removed with                        a cold snare. Resected and retrieved.                       - Diverticulosis in the entire examined colon.                       - Non-bleeding internal hemorrhoids. Recommendation:       - Await pathology results.                       - Repeat colonoscopy in 5 years if polyp adenoma and 10                        years if hyperplastic Procedure Code(s):    ---  Professional ---                       [phone], Colonoscopy, flexible; with removal of tumor(s),                        polyp(s), or other lesion(s) by snare technique                       [id number], 59, Colonoscopy, flexible; with biopsy, single                        or multiple Diagnosis Code(s):    --- Professional ---                        Z12.11, Encounter for screening for malignant neoplasm                        of colon                       D12.0, Benign neoplasm of cecum                       D12.4, Benign neoplasm of descending colon CPT copyright 2016 American Medical Association. All rights reserved. The codes documented in this report are preliminary and upon coder review may  be revised to meet current compliance requirements. Lucilla Lame, MD 04/05/2016 10:47:03 AM This report has been signed electronically. Number of Addenda: 0 Note Initiated On: 04/05/2016 10:29 AM Scope Withdrawal Time: 0 hours 6 minutes 56 seconds  Total Procedure Duration: 0 hours 8 minutes 29 seconds       [facility]

## 2016-04-05 NOTE — Anesthesia Preprocedure Evaluation (Signed)
Anesthesia Evaluation  Patient identified by MRN, date of birth, ID band  Reviewed: NPO status   History of Anesthesia Complications Negative for: history of anesthetic complications  Airway Mallampati: II  TM Distance: >3 FB Neck ROM: full    Dental no notable dental hx.    Pulmonary neg pulmonary ROS, former smoker,    Pulmonary exam normal        Cardiovascular Exercise Tolerance: Good hypertension, Normal cardiovascular exam     Neuro/Psych negative neurological ROS  negative psych ROS   GI/Hepatic Neg liver ROS, GERD  Controlled,  Endo/Other  negative endocrine ROS  Renal/GU negative Renal ROS   Prostate cancer     Musculoskeletal   Abdominal   Peds  Hematology Prostate cancer    Anesthesia Other Findings   Reproductive/Obstetrics                             Anesthesia Physical Anesthesia Plan  ASA: II  Anesthesia Plan: MAC   Post-op Pain Management:    Induction:   Airway Management Planned:   Additional Equipment:   Intra-op Plan:   Post-operative Plan:   Informed Consent: I have reviewed the patients History and Physical, chart, labs and discussed the procedure including the risks, benefits and alternatives for the proposed anesthesia with the patient or authorized representative who has indicated his/her understanding and acceptance.     Plan Discussed with: CRNA  Anesthesia Plan Comments:         Anesthesia Quick Evaluation

## 2016-04-05 NOTE — Anesthesia Procedure Notes (Signed)
Procedure Name: MAC Date/Time: 04/05/2016 10:33 AM Performed by: Cameron Ali Pre-anesthesia Checklist: Patient identified, Emergency Drugs available, Suction available, Timeout performed and Patient being monitored Patient Re-evaluated:Patient Re-evaluated prior to inductionOxygen Delivery Method: Nasal cannula Placement Confirmation: positive ETCO2

## 2016-04-05 NOTE — Transfer of Care (Signed)
Immediate Anesthesia Transfer of Care Note  Patient: Jared Herrera  Procedure(s) Performed: Procedure(s): COLONOSCOPY WITH PROPOFOL (N/A)  Patient Location: PACU  Anesthesia Type: MAC  Level of Consciousness: awake, alert  and patient cooperative  Airway and Oxygen Therapy: Patient Spontanous Breathing and Patient connected to supplemental oxygen  Post-op Assessment: Post-op Vital signs reviewed, Patient's Cardiovascular Status Stable, Respiratory Function Stable, Patent Airway and No signs of Nausea or vomiting  Post-op Vital Signs: Reviewed and stable  Complications: No apparent anesthesia complications

## 2016-04-05 NOTE — Anesthesia Postprocedure Evaluation (Signed)
Anesthesia Post Note  Patient: Jared Herrera  Procedure(s) Performed: Procedure(s) (LRB): COLONOSCOPY WITH PROPOFOL (N/A)  Patient location during evaluation: PACU Anesthesia Type: MAC Level of consciousness: awake and alert Pain management: pain level controlled Vital Signs Assessment: post-procedure vital signs reviewed and stable Respiratory status: spontaneous breathing, nonlabored ventilation, respiratory function stable and patient connected to nasal cannula oxygen Cardiovascular status: stable and blood pressure returned to baseline Anesthetic complications: no    Adelbert Gaspard

## 2016-04-06 ENCOUNTER — Encounter: Payer: Self-pay | Admitting: Gastroenterology

## 2016-04-13 ENCOUNTER — Encounter: Payer: Self-pay | Admitting: Gastroenterology

## 2016-07-29 ENCOUNTER — Encounter: Payer: Self-pay | Admitting: Urology

## 2016-07-29 ENCOUNTER — Ambulatory Visit: Payer: Medicare Other | Admitting: Urology

## 2016-07-29 ENCOUNTER — Telehealth: Payer: Self-pay | Admitting: Urology

## 2016-07-29 NOTE — Progress Notes (Deleted)
8:11 AM  07/29/16  Jared Herrera 03/25/1951 [id number]  Referring provider: Kathrine Haddock, NP [place], [place] [address]  No chief complaint on file.   HPI: [name] on active surveillance of prostate cancer dx 10/2015.  His PSA was noted to rise to 6.5 from 4.6 only 9 months ago. Repeat PSA 6.2.   Prostate biopsy showed Gleason 3+3 in 2/12 cores up to 7% at right lateral base and left mid prostate on 1/31/7.  TRUS volume 43 g.  Rectal exam unremarkable.     Most recent PSA stable, 6.2 on 03/26/16.    He returns to the office today for DRE/ PSA.  No family history of prostate cancer.    Minimal voiding symptoms, IPSS 1/1.  No ED, SHIM 19/25.  PMH: Past Medical History:  Diagnosis Date  . Cataract    bilateral  . GERD (gastroesophageal reflux disease)   . Hypertension    controlled on meds  . Sinus drainage     Surgical History: Past Surgical History:  Procedure Laterality Date  . COLONOSCOPY WITH PROPOFOL N/A 04/05/2016   Procedure: COLONOSCOPY WITH PROPOFOL;  Surgeon: Lucilla Lame, MD;  Location: [facility];  Service: Endoscopy;  Laterality: N/A;  . PROSTATE BIOPSY  March 2017   showed some cells, but they are slow growing  . TONSILLECTOMY AND ADENOIDECTOMY    . WISDOM TOOTH EXTRACTION      Home Medications:    Medication List       Accurate as of 07/29/16  8:11 AM. Always use your most recent med list.          aspirin 81 MG tablet Take 81 mg by mouth daily. Am, Reported on 11/11/2015   lisinopril-hydrochlorothiazide 20-25 MG tablet Commonly known as:  PRINZIDE,ZESTORETIC Take 1 tablet by mouth daily. Increase from 20/12.5   loratadine 10 MG tablet Commonly known as:  CLARITIN Take 10 mg by mouth daily. am   ranitidine 150 MG tablet Commonly known as:  ZANTAC Take 150 mg by mouth as needed.   ULTRA MAN PO Take by mouth daily. Am,2 tablets daily       Allergies: No Known Allergies  Family History: Family History    Problem Relation Age of Onset  . Heart disease Brother   . Hypertension Brother   . Hypertension Mother   . Kidney disease Mother   . Cancer Sister     thyroid  . Lupus Daughter   . Kidney disease Daughter   . COPD Neg Hx   . Diabetes Neg Hx   . Stroke Neg Hx     Social History:  reports that he quit smoking about 37 years ago. His smoking use included Cigarettes. He has a 4.00 pack-year smoking history. He has never used smokeless tobacco. He reports that he drinks alcohol. He reports that he does not use drugs.   Physical Exam: There were no vitals taken for this visit.  Constitutional:  Alert and oriented, No acute distress.  Presents today with wife, daughter, and granddaughter.   HEENT: [place] AT, moist mucus membranes.  Trachea midline, no masses. Cardiovascular: No clubbing, cyanosis, or edema. Respiratory: Normal respiratory effort, no increased work of breathing..  Rectal exam: Normal sphincter tone, 40 cc prostate, possible small nodule at apex but very subtile  Skin: No rashes, bruises or suspicious lesions. Neurologic: Grossly intact, no focal deficits, moving all 4 extremities. Psychiatric: Normal mood and affect.   Pertinent Imaging: n/a  Assessment &  Plan:    1. Prostate cancer [facility]) Continue active surveillance for very low risk cT1c prostate cancer DRE today stable Will call with this PSA result  Recommend additional surveillance given upcoming 1 year since dx (1/17) with f/u imaging vs. Repeat biopsy at 1 year or sooner as needed   No Follow-up on file.   Hollice Espy, MD  [facility] [address] [place], [place] [address] [phone]

## 2016-07-29 NOTE — Telephone Encounter (Signed)
No show today for prostate cancer follow up, called and left message on VM re: importance of follow up and advised to call and reschedule ASAP.    Hollice Espy, MD

## 2016-08-18 ENCOUNTER — Ambulatory Visit (INDEPENDENT_AMBULATORY_CARE_PROVIDER_SITE_OTHER): Payer: Medicare Other | Admitting: Urology

## 2016-08-18 ENCOUNTER — Encounter: Payer: Self-pay | Admitting: Urology

## 2016-08-18 VITALS — BP 134/78 | HR 116 | Ht 69.0 in | Wt 212.2 lb

## 2016-08-18 DIAGNOSIS — C61 Malignant neoplasm of prostate: Secondary | ICD-10-CM

## 2016-08-18 NOTE — Patient Instructions (Signed)

## 2016-08-18 NOTE — Progress Notes (Signed)
10:27 AM  08/18/16  Jared Herrera 1951-04-16 [id number]  Referring provider: Kathrine Haddock, NP [place], [place] [address]  Chief Complaint  Patient presents with  . Prostate Cancer    HPI: [name] on active surveillance of prostate cancer dx 10/2015.  His PSA was noted to rise to 6.5 from 4.6 only 9 months ago. Repeat PSA 6.2.   Prostate biopsy showed Gleason 3+3 in 2/12 cores up to 7% at right lateral base and left mid prostate on 1/31/7.  TRUS volume 43 g.  Rectal exam unremarkable.     Most recent PSA stable, 6.2 on 03/26/16.    He returns to the office today for DRE/ PSA.   No family history of prostate cancer.    Minimal voiding symptoms, IPSS 1/1.  No ED, SHIM 19/25.  PMH: Past Medical History:  Diagnosis Date  . Cataract    bilateral  . GERD (gastroesophageal reflux disease)   . Hypertension    controlled on meds  . Sinus drainage     Surgical History: Past Surgical History:  Procedure Laterality Date  . COLONOSCOPY WITH PROPOFOL N/A 04/05/2016   Procedure: COLONOSCOPY WITH PROPOFOL;  Surgeon: Lucilla Lame, MD;  Location: [facility];  Service: Endoscopy;  Laterality: N/A;  . PROSTATE BIOPSY  March 2017   showed some cells, but they are slow growing  . TONSILLECTOMY AND ADENOIDECTOMY    . WISDOM TOOTH EXTRACTION      Home Medications:    Medication List       Accurate as of 08/18/16 10:27 AM. Always use your most recent med list.          aspirin 81 MG tablet Take 81 mg by mouth daily. Am, Reported on 11/11/2015   lisinopril-hydrochlorothiazide 20-25 MG tablet Commonly known as:  PRINZIDE,ZESTORETIC Take 1 tablet by mouth daily. Increase from 20/12.5   loratadine 10 MG tablet Commonly known as:  CLARITIN Take 10 mg by mouth daily. am   ranitidine 150 MG tablet Commonly known as:  ZANTAC Take 150 mg by mouth as needed.   ULTRA MAN PO Take by mouth daily. Am,2 tablets daily       Allergies: No Known  Allergies  Family History: Family History  Problem Relation Age of Onset  . Heart disease Brother   . Hypertension Brother   . Hypertension Mother   . Kidney disease Mother   . Cancer Sister     thyroid  . Lupus Daughter   . Kidney disease Daughter   . COPD Neg Hx   . Diabetes Neg Hx   . Stroke Neg Hx     Social History:  reports that he quit smoking about 37 years ago. His smoking use included Cigarettes. He has a 4.00 pack-year smoking history. He has never used smokeless tobacco. He reports that he drinks alcohol. He reports that he does not use drugs.  Psychologic Depression?: No Anxiety?: NoPhysical Exam: BP 134/78 (BP Location: Left Arm, Patient Position: Sitting, Cuff Size: Large)   Pulse (!) 116   Ht 5\' 9"  (1.753 m)   Wt 212 lb 3.2 oz (96.3 kg)   BMI 31.34 kg/m   Constitutional:  Alert and oriented, No acute distress.  Presents today with wife, daughter, and granddaughter.   HEENT: [place] AT, moist mucus membranes.  Trachea midline, no masses. Cardiovascular: No clubbing, cyanosis, or edema. Respiratory: Normal respiratory effort, no increased work of breathing..  Rectal exam: Normal sphincter tone, 40 cc prostate,  possible small nodule at apex but very subtile, stable from previous exam Skin: No rashes, bruises or suspicious lesions. Neurologic: Grossly intact, no focal deficits, moving all 4 extremities. Psychiatric: Normal mood and affect.   Pertinent Imaging: n/a  Assessment & Plan:    1. Prostate cancer [facility]) Continue active surveillance for very low risk cT1c prostate cancer DRE today stable Will call with this PSA result  Recommend additional surveillance given upcoming 1 year since dx (1/17) with f/u imaging vs. Repeat biopsy at 1 year.   He is most interested in repeat biopsy, risks/ benefits reviewed.  Hold ASA 7 days prior to procedure.    Return in about 3 months (around 11/18/2016) for prostate biopsy.   Hollice Espy, MD  [facility] [address], [place], [place] [address] [phone]

## 2016-08-19 LAB — PSA: PROSTATE SPECIFIC AG, SERUM: 7.1 ng/mL — AB (ref 0.0–4.0)

## 2016-08-20 ENCOUNTER — Ambulatory Visit (INDEPENDENT_AMBULATORY_CARE_PROVIDER_SITE_OTHER): Payer: Medicare Other | Admitting: Unknown Physician Specialty

## 2016-08-20 ENCOUNTER — Encounter: Payer: Self-pay | Admitting: Unknown Physician Specialty

## 2016-08-20 VITALS — BP 126/79 | HR 96 | Temp 97.8°F | Ht 69.4 in | Wt 211.4 lb

## 2016-08-20 DIAGNOSIS — Z23 Encounter for immunization: Secondary | ICD-10-CM

## 2016-08-20 DIAGNOSIS — E782 Mixed hyperlipidemia: Secondary | ICD-10-CM

## 2016-08-20 DIAGNOSIS — E785 Hyperlipidemia, unspecified: Secondary | ICD-10-CM | POA: Insufficient documentation

## 2016-08-20 DIAGNOSIS — I10 Essential (primary) hypertension: Secondary | ICD-10-CM

## 2016-08-20 DIAGNOSIS — E663 Overweight: Secondary | ICD-10-CM | POA: Insufficient documentation

## 2016-08-20 DIAGNOSIS — R7301 Impaired fasting glucose: Secondary | ICD-10-CM

## 2016-08-20 LAB — BAYER DCA HB A1C WAIVED: HB A1C: 6.2 % (ref <7.0)

## 2016-08-20 NOTE — Patient Instructions (Signed)

## 2016-08-20 NOTE — Progress Notes (Signed)
   BP 126/79 (BP Location: Left Arm, Cuff Size: Large)   Pulse 96   Temp 97.8 F (36.6 C)   Ht 5' 9.4" (1.763 m) Comment: pt had shoes on  Wt 211 lb 6.4 oz (95.9 kg) Comment: pt had shoes on  SpO2 97%   BMI 30.86 kg/m    Subjective:    Patient ID: Jared Herrera, male    DOB: 05/16/1951, 65 y.o.   MRN: [id number]  HPI: [name] is a 65 y.o. male  Chief Complaint  Patient presents with  . Hypertension   Hypertension Using medications without difficulty Average home BPs Not checking   No problems or lightheadedness No chest pain with exertion or shortness of breath No Edema  Relevant past medical, surgical, family and social history reviewed and updated as indicated. Interim medical history since our last visit reviewed. Allergies and medications reviewed and updated.  Review of Systems  Per HPI unless specifically indicated above     Objective:    BP 126/79 (BP Location: Left Arm, Cuff Size: Large)   Pulse 96   Temp 97.8 F (36.6 C)   Ht 5' 9.4" (1.763 m) Comment: pt had shoes on  Wt 211 lb 6.4 oz (95.9 kg) Comment: pt had shoes on  SpO2 97%   BMI 30.86 kg/m   Wt Readings from Last 3 Encounters:  08/20/16 211 lb 6.4 oz (95.9 kg)  08/18/16 212 lb 3.2 oz (96.3 kg)  04/05/16 202 lb (91.6 kg)    Physical Exam  Constitutional: He is oriented to person, place, and time. He appears well-developed and well-nourished. No distress.  HENT:  Head: Normocephalic and atraumatic.  Eyes: Conjunctivae and lids are normal. Right eye exhibits no discharge. Left eye exhibits no discharge. No scleral icterus.  Neck: Normal range of motion. Neck supple. No JVD present. Carotid bruit is not present.  Cardiovascular: Normal rate, regular rhythm and normal heart sounds.   Pulmonary/Chest: Effort normal and breath sounds normal. No respiratory distress.  Abdominal: Normal appearance. There is no splenomegaly or hepatomegaly.  Musculoskeletal: Normal range of motion.    Neurological: He is alert and oriented to person, place, and time.  Skin: Skin is warm, dry and intact. No rash noted. No pallor.  Psychiatric: He has a normal mood and affect. His behavior is normal. Judgment and thought content normal.    Results for orders placed or performed in visit on 08/18/16  PSA  Result Value Ref Range   Prostate Specific Ag, Serum 7.1 (H) 0.0 - 4.0 ng/mL      Assessment & Plan:   Problem List Items Addressed This Visit      Unprioritized   Hyperlipemia    Discussed ASCVD risk calculator      Hypertension    Stable, continue present medications.  Recheck ABP is 127      Relevant Orders   Comprehensive metabolic panel   IFG (impaired fasting glucose)   Relevant Orders   Bayer DCA Hb A1c Waived   Overweight    Discussed weight loss       Other Visit Diagnoses    Need for influenza vaccination    -  Primary   Relevant Orders   Flu vaccine HIGH DOSE PF (Completed)       Follow up plan: Return in about 6 months (around 02/17/2017) for physical.

## 2016-08-20 NOTE — Assessment & Plan Note (Signed)
Stable, continue present medications.  Recheck ABP is 127

## 2016-08-20 NOTE — Assessment & Plan Note (Signed)
Discussed weight loss. 

## 2016-08-20 NOTE — Assessment & Plan Note (Signed)
Discussed ASCVD risk calculator

## 2016-08-21 LAB — COMPREHENSIVE METABOLIC PANEL
ALBUMIN: 4.2 g/dL (ref 3.6–4.8)
ALK PHOS: 85 IU/L (ref 39–117)
ALT: 29 IU/L (ref 0–44)
AST: 22 IU/L (ref 0–40)
Albumin/Globulin Ratio: 1.4 (ref 1.2–2.2)
BUN / CREAT RATIO: 17 (ref 10–24)
BUN: 13 mg/dL (ref 8–27)
Bilirubin Total: 0.6 mg/dL (ref 0.0–1.2)
CALCIUM: 9.8 mg/dL (ref 8.6–10.2)
CO2: 25 mmol/L (ref 18–29)
CREATININE: 0.78 mg/dL (ref 0.76–1.27)
Chloride: 98 mmol/L (ref 96–106)
GFR calc Af Amer: 109 mL/min/{1.73_m2} (ref >59)
GFR, EST NON AFRICAN AMERICAN: 94 mL/min/{1.73_m2} (ref >59)
GLOBULIN, TOTAL: 3.1 g/dL (ref 1.5–4.5)
GLUCOSE: 111 mg/dL — AB (ref 65–99)
Potassium: 4.1 mmol/L (ref 3.5–5.2)
Sodium: 140 mmol/L (ref 134–144)
Total Protein: 7.3 g/dL (ref 6.0–8.5)

## 2016-08-23 ENCOUNTER — Telehealth: Payer: Self-pay

## 2016-08-23 ENCOUNTER — Encounter: Payer: Self-pay | Admitting: Unknown Physician Specialty

## 2016-08-23 NOTE — Telephone Encounter (Signed)
LMOM

## 2016-08-23 NOTE — Telephone Encounter (Signed)
Spoke with pt in reference to PSA and prostate bx. Pt voiced understanding.

## 2016-08-23 NOTE — Telephone Encounter (Signed)
-----   Message from Hollice Espy, MD sent at 08/19/2016  8:11 AM EST ----- PSA is up slightly. We will reassess with biopsy as planned.  Hollice Espy, MD

## 2016-11-19 ENCOUNTER — Ambulatory Visit (INDEPENDENT_AMBULATORY_CARE_PROVIDER_SITE_OTHER): Payer: Medicare Other | Admitting: Urology

## 2016-11-19 ENCOUNTER — Encounter: Payer: Self-pay | Admitting: Urology

## 2016-11-19 VITALS — BP 120/79 | HR 101 | Ht 69.0 in | Wt 210.3 lb

## 2016-11-19 DIAGNOSIS — C61 Malignant neoplasm of prostate: Secondary | ICD-10-CM

## 2016-11-19 NOTE — Progress Notes (Signed)
Patient presented today for confirmatory prostate biopsy. He's been taking his 81 mg of aspirin forgot to stop this. Given that this is routine and nonurgent, will reschedule for next week and he will stop his aspirin starting today.  Jared Espy, MD

## 2016-11-26 ENCOUNTER — Encounter: Payer: Self-pay | Admitting: Urology

## 2016-11-26 ENCOUNTER — Ambulatory Visit (INDEPENDENT_AMBULATORY_CARE_PROVIDER_SITE_OTHER): Payer: Medicare Other | Admitting: Urology

## 2016-11-26 ENCOUNTER — Other Ambulatory Visit: Payer: Self-pay | Admitting: Urology

## 2016-11-26 VITALS — BP 124/75 | HR 116 | Ht 68.0 in | Wt 209.9 lb

## 2016-11-26 DIAGNOSIS — C61 Malignant neoplasm of prostate: Secondary | ICD-10-CM

## 2016-11-26 MED ORDER — GENTAMICIN SULFATE 40 MG/ML IJ SOLN
80.0000 mg | Freq: Once | INTRAMUSCULAR | Status: AC
  Administered 2016-11-26: 80 mg via INTRAMUSCULAR

## 2016-11-26 MED ORDER — LEVOFLOXACIN 500 MG PO TABS
500.0000 mg | ORAL_TABLET | Freq: Once | ORAL | Status: AC
  Administered 2016-11-26: 500 mg via ORAL

## 2016-11-26 NOTE — Progress Notes (Signed)
   11/26/16  CC:  Chief Complaint  Patient presents with  . Follow-up    biopsy    HPI: 66 yo AAM on active surveillance of prostate cancer dx 10/2015.  His PSA was noted to rise to 6.5 from 4.6 only 9 months ago. Repeat PSA 6.2.   Prostate biopsy showed Gleason 3+3 in 2/12 cores up to 7% at right lateral base and left mid prostate on 1/31/7.  TRUS volume 43 g.  Rectal exam unremarkable.     Most recent PSA up slightly to 7.1 in 08/2016.   Blood pressure 124/75, pulse (!) 116, height 5\' 8"  (1.727 m), weight 209 lb 14.4 oz (95.2 kg). NED. A&Ox3.   No respiratory distress   Abd soft, NT, ND Normal sphincter tone. Firm prostate diffusely with questionable nodule at the apex.   Prostate Biopsy Procedure   Informed consent was obtained after discussing risks/benefits of the procedure.  A time out was performed to ensure correct patient identity.  Pre-Procedure: - Last PSA Level:   - Gentamicin given prophylactically - Levaquin 500 mg administered PO -Transrectal Ultrasound performed revealing a 54 gm prostate -No significant median lobe noted -Nodule, large and well circumscribed in the right mid gland -Muliple small hypoechoic areas in the bilateral apical prostate  Procedure: - Prostate block performed using 10 cc 1% lidocaine and biopsies taken from sextant areas, a total of 12 under ultrasound guidance.  Post-Procedure: - Patient tolerated the procedure well - He was counseled to seek immediate medical attention if experiences any severe pain, significant bleeding, or fevers - Return in one week to discuss biopsy results   Assessment/ Plan:  1. Prostate cancer Status post uncomplicated confirmatory biopsy today Plan for follow-up in 1 week to review pathology results Warning symptoms reviewed today in detail  Hollice Espy, MD

## 2016-11-30 ENCOUNTER — Ambulatory Visit: Payer: Medicare Other | Admitting: Urology

## 2016-12-03 LAB — PATHOLOGY REPORT

## 2016-12-06 ENCOUNTER — Other Ambulatory Visit: Payer: Self-pay | Admitting: Urology

## 2016-12-07 ENCOUNTER — Encounter: Payer: Self-pay | Admitting: Urology

## 2016-12-07 ENCOUNTER — Ambulatory Visit (INDEPENDENT_AMBULATORY_CARE_PROVIDER_SITE_OTHER): Payer: Medicare Other | Admitting: Urology

## 2016-12-07 VITALS — BP 146/75 | HR 120 | Ht 69.0 in | Wt 210.0 lb

## 2016-12-07 DIAGNOSIS — C61 Malignant neoplasm of prostate: Secondary | ICD-10-CM

## 2016-12-07 NOTE — Progress Notes (Signed)
3:42 PM  12/07/16  Jared Herrera 04-29-1951 [id number]  Referring provider: Kathrine Haddock, NP [place], [place] [address]  Chief Complaint  Patient presents with  . Prostate Cancer    prostate biopsy results    HPI: 66 yo AAM on active surveillance of prostate cancer dx 10/2015 Who returns today to discuss his most recent prostate biopsy now showing intermediate risk prostate cancer.  His PSA was noted to rise to 6.5 from 4.6 only 9 months ago. Repeat PSA 6.2.   Prostate biopsy showed Gleason 3+3 in 2/12 cores up to 7% at right lateral base and left mid prostate on 1/31/7.  TRUS volume 43 g.  Rectal exam unremarkable.     He underwent confirmatory prostate biopsy on 11/29/2016 which now shows upgrading of his prostate cancer with 1 core of 3+4, 19% at the right lateral mid prostate (40% of the tumor comprised of Gleason grade 4). He also has 2 additional low volume cores of Gleason 3+3 in the left lateral mid and apex.  Note, he did have an ultrasound abnormality at this exact site.  TRUS volume 54 g.  Most recent PSA 7.1 on 08/2016.    No family history of prostate cancer.     Minimal voiding symptoms, IPSS 0/1.  No ED, SHIM 23/25.      IPSS    Row Name 12/07/16 1500         International Prostate Symptom Score   How often have you had the sensation of not emptying your bladder? Not at All     How often have you had to urinate less than every two hours? Not at All     How often have you found you stopped and started again several times when you urinated? Not at All     How often have you found it difficult to postpone urination? Not at All     How often have you had a weak urinary stream? Not at All     How often have you had to strain to start urination? Not at All     How many times did you typically get up at night to urinate? None     Total IPSS Score 0       Quality of Life due to urinary symptoms   If you were to spend the rest of your life with your  urinary condition just the way it is now how would you feel about that? Pleased        Score:  1-7 Mild 8-19 Moderate 20-35 Severe      SHIM    Row Name 12/07/16 1515         SHIM: Over the last 6 months:   How do you rate your confidence that you could get and keep an erection? High     When you had erections with sexual stimulation, how often were your erections hard enough for penetration (entering your partner)? Almost Always or Always     During sexual intercourse, how often were you able to maintain your erection after you had penetrated (entered) your partner? Not Difficult     During sexual intercourse, how difficult was it to maintain your erection to completion of intercourse? Not Difficult     When you attempted sexual intercourse, how often was it satisfactory for you? Slightly Difficult       SHIM Total Score   SHIM 23         PMH: Past  Medical History:  Diagnosis Date  . Cataract    bilateral  . GERD (gastroesophageal reflux disease)   . Hypertension    controlled on meds  . Sinus drainage     Surgical History: Past Surgical History:  Procedure Laterality Date  . COLONOSCOPY WITH PROPOFOL N/A 04/05/2016   Procedure: COLONOSCOPY WITH PROPOFOL;  Surgeon: Lucilla Lame, MD;  Location: [facility];  Service: Endoscopy;  Laterality: N/A;  . PROSTATE BIOPSY  March 2017   showed some cells, but they are slow growing  . TONSILLECTOMY AND ADENOIDECTOMY    . WISDOM TOOTH EXTRACTION      Home Medications:  Allergies as of 12/07/2016   No Known Allergies     Medication List       Accurate as of 12/07/16  3:42 PM. Always use your most recent med list.          aspirin 81 MG tablet Take 81 mg by mouth daily. Am, Reported on 11/11/2015   lisinopril-hydrochlorothiazide 20-25 MG tablet Commonly known as:  PRINZIDE,ZESTORETIC Take 1 tablet by mouth daily. Increase from 20/12.5   loratadine 10 MG tablet Commonly known as:  CLARITIN Take 10 mg by  mouth daily. am   ranitidine 150 MG tablet Commonly known as:  ZANTAC Take 150 mg by mouth as needed.   ULTRA MAN PO Take by mouth daily. Am,2 tablets daily       Allergies: No Known Allergies  Family History: Family History  Problem Relation Age of Onset  . Heart disease Brother   . Hypertension Brother   . Hypertension Mother   . Kidney disease Mother   . Cancer Sister     thyroid  . Lupus Daughter   . Kidney disease Daughter   . COPD Neg Hx   . Diabetes Neg Hx   . Stroke Neg Hx     Social History:  reports that he quit smoking about 38 years ago. His smoking use included Cigarettes. He has a 4.00 pack-year smoking history. He has never used smokeless tobacco. He reports that he drinks alcohol. He reports that he does not use drugs.  Psychologic Depression?: No Anxiety?: NoPhysical Exam: BP (!) 146/75   Pulse (!) 120   Ht 5\' 9"  (1.753 m)   Wt 210 lb (95.3 kg)   BMI 31.01 kg/m   Constitutional:  Alert and oriented, No acute distress.  Presents today with wife, daughter, and granddaughter.   HEENT: [place] AT, moist mucus membranes.  Trachea midline, no masses. Cardiovascular: No clubbing, cyanosis, or edema. Respiratory: Normal respiratory effort, no increased work of breathing..  Skin: No rashes, bruises or suspicious lesions. Neurologic: Grossly intact, no focal deficits, moving all 4 extremities. Psychiatric: Normal mood and affect.   Pertinent Imaging:  n/a  Assessment & Plan:    1. Prostate cancer [facility]) 66 year old male with a T2a intermediate risk prostate cancer, previously on surveillance with low risk cancer.   The patient was counseled about the natural history of prostate cancer and the standard treatment options that are available for prostate cancer. It was explained to him how his age and life expectancy, clinical stage, Gleason score, and PSA affect his prognosis, the decision to proceed with additional staging studies, as well as how that  information influences recommended treatment strategies. We discussed the roles for active surveillance, radiation therapy, surgical therapy, androgen deprivation, as well as ablative therapy options for the treatment of prostate cancer as appropriate to his individual cancer situation. We discussed the  risks and benefits of these options with regard to their impact on cancer control and also in terms of potential adverse events, complications, and impact on quality of life particularly related to urinary, bowel, and sexual function. The patient was encouraged to ask questions throughout the discussion today and all questions were answered to his stated satisfaction. In addition, the patient was providedwith and/or directed to appropriate resources and literature for further education about prostate cancer treatment options.  We discussed surgical therapy for prostate cancer including the different available surgical approaches. We discussed, in detail, the risks and expectations of surgery with regard to cancer control, urinary control, and erectile dysfunction as well as expected post operative cover he processed. Additional risks of surgery including but not limalited to bleeding, infection, hernia formation, nerve damage, steel formation, bowel/rect injury, potentially necessitating colostomy, damage to the urinary tract resulting in urinary leakage, urethral stricture, and cardiopulmonary risk such as myocardial infarction, stroke, death, thromboembolism etc. were explained. The risk of open surgical conversion for robotics/laparoscopic prostatectomy is also discussed.  He is not particularly interested in surgery although is an excellent candidate. He is more interested in brachial seed implant versus external beam radiation. We'll refer him to discuss this further with Dr. Baruch Gouty and return in 4 weeks.     Return in about 4 weeks (around 01/04/2017) for f/u prostate cancer.   Hollice Espy,  MD  [facility] [address], [place], [place] [address] [phone]   I spent 25 min with this patient of which greater than 50% was spent in counseling and coordination of care with the patient.

## 2016-12-21 ENCOUNTER — Encounter: Payer: Self-pay | Admitting: Radiation Oncology

## 2016-12-21 ENCOUNTER — Ambulatory Visit
Admission: RE | Admit: 2016-12-21 | Discharge: 2016-12-21 | Disposition: A | Discharge status: Home / Self Care | Payer: Medicare Other | Source: Ambulatory Visit | Attending: Radiation Oncology | Admitting: Radiation Oncology

## 2016-12-21 VITALS — BP 171/85 | HR 122 | Temp 97.8°F | Wt 208.8 lb

## 2016-12-21 DIAGNOSIS — C61 Malignant neoplasm of prostate: Secondary | ICD-10-CM | POA: Insufficient documentation

## 2016-12-21 DIAGNOSIS — Z923 Personal history of irradiation: Secondary | ICD-10-CM | POA: Insufficient documentation

## 2016-12-21 DIAGNOSIS — K219 Gastro-esophageal reflux disease without esophagitis: Secondary | ICD-10-CM | POA: Diagnosis not present

## 2016-12-21 DIAGNOSIS — I10 Essential (primary) hypertension: Secondary | ICD-10-CM | POA: Diagnosis not present

## 2016-12-21 DIAGNOSIS — Z803 Family history of malignant neoplasm of breast: Secondary | ICD-10-CM | POA: Insufficient documentation

## 2016-12-21 DIAGNOSIS — Z87891 Personal history of nicotine dependence: Secondary | ICD-10-CM | POA: Insufficient documentation

## 2016-12-21 DIAGNOSIS — Z79899 Other long term (current) drug therapy: Secondary | ICD-10-CM | POA: Insufficient documentation

## 2016-12-21 NOTE — Consult Note (Signed)
NEW PATIENT EVALUATION  Name: Jared Herrera  MRN: [id number]  Date:   12/21/2016     DOB: [birth year]   This 66 y.o. 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  REFERRING PHYSICIAN: Kathrine Haddock, NP  CHIEF COMPLAINT:  Chief Complaint  Patient presents with  . Prostate Cancer    DIAGNOSIS: The encounter diagnosis was Malignant neoplasm of prostate ([place]).   PREVIOUS INVESTIGATIONS:  Pathology reports reviewed Clinical notes reviewed Bone scan not performed based on low PSA  HPI: Patient is a 66 year old male diagnosed in January 2017 when he presented with a PSA of 4.6. Initially 2 cores were positive for Gleason 6 (3+3) and patient chose active surveillance. Recently had a repeat PSA were PSA had jumped to 6. 2 repeat biopsy showed 3 of 12 cores positive for adenocarcinoma with 1 showing a Gleason 7 (3+4). By transrectal ultrasound he has a volume of 43 cc. Most recent PSA was 7.1 on November 2017. He is asymptomatic specifically denies urinary frequency urgency or nocturia. He's been rather stressed about his diagnosis lately. He is seen urology for surgical opinion is seen today for opinion regarding radiation treatment.  PLANNED TREATMENT REGIMEN: I-125 interstitial implant  PAST MEDICAL HISTORY:  has a past medical history of Cataract; GERD (gastroesophageal reflux disease); Hypertension; and Sinus drainage.    PAST SURGICAL HISTORY:  Past Surgical History:  Procedure Laterality Date  . COLONOSCOPY WITH PROPOFOL N/A 04/05/2016   Procedure: COLONOSCOPY WITH PROPOFOL;  Surgeon: Lucilla Lame, MD;  Location: [place];  Service: Endoscopy;  Laterality: N/A;  . PROSTATE BIOPSY  March 2017   showed some cells, but they are slow growing  . TONSILLECTOMY AND ADENOIDECTOMY    . WISDOM TOOTH EXTRACTION      FAMILY HISTORY: family history includes Cancer in his sister; Heart  disease in his brother; Hypertension in his brother and mother; Kidney disease in his daughter and mother; Lupus in his daughter.  SOCIAL HISTORY:  reports that he quit smoking about 38 years ago. His smoking use included Cigarettes. He has a 4.00 pack-year smoking history. He has never used smokeless tobacco. He reports that he drinks alcohol. He reports that he does not use drugs.  ALLERGIES: Patient has no known allergies.  MEDICATIONS:  Current Outpatient Prescriptions  Medication Sig Dispense Refill  . aspirin 81 MG tablet Take 81 mg by mouth daily. Am, Reported on 11/11/2015    . lisinopril-hydrochlorothiazide (PRINZIDE,ZESTORETIC) 20-25 MG tablet Take 1 tablet by mouth daily. Increase from 20/12.5 (Patient taking differently: Take 1 tablet by mouth daily. Am,Increase from 20/12.5) 90 tablet 3  . loratadine (CLARITIN) 10 MG tablet Take 10 mg by mouth daily. am    . ranitidine (ZANTAC) 150 MG tablet Take 150 mg by mouth as needed.     Marland Kitchen Specialty Vitamins Products (ULTRA MAN PO) Take by mouth daily. Am,2 tablets daily     No current facility-administered medications for this encounter.     ECOG PERFORMANCE STATUS:  0 - Asymptomatic  REVIEW OF SYSTEMS:  Patient denies any weight loss, fatigue, weakness, fever, chills or night sweats. Patient denies any loss of vision, blurred vision. Patient denies any ringing  of the ears or hearing loss. No irregular heartbeat. Patient denies heart murmur or history of fainting. Patient denies any chest pain or pain radiating to her upper extremities. Patient denies any shortness of breath, difficulty  breathing at night, cough or hemoptysis. Patient denies any swelling in the lower legs. Patient denies any nausea vomiting, vomiting of blood, or coffee ground material in the vomitus. Patient denies any stomach pain. Patient states has had normal bowel movements no significant constipation or diarrhea. Patient denies any dysuria, hematuria or significant  nocturia. Patient denies any problems walking, swelling in the joints or loss of balance. Patient denies any skin changes, loss of hair or loss of weight. Patient denies any excessive worrying or anxiety or significant depression. Patient denies any problems with insomnia. Patient denies excessive thirst, polyuria, polydipsia. Patient denies any swollen glands, patient denies easy bruising or easy bleeding. Patient denies any recent infections, allergies or URI. Patient "s visual fields have not changed significantly in recent time.    PHYSICAL EXAM: BP (!) 171/85   Pulse (!) 122   Temp 97.8 F (36.6 C)   Wt 208 lb 12.4 oz (94.7 kg)   BMI 30.83 kg/m  On rectal exam rectal sphincter tone is good prostate is smooth and firm without evidence of nodularity or mass. Sulcus is preserved bilaterally. No other rectal abnormality is identified. Well-developed well-nourished patient in NAD. HEENT reveals PERLA, EOMI, discs not visualized.  Oral cavity is clear. No oral mucosal lesions are identified. Neck is clear without evidence of cervical or supraclavicular adenopathy. Lungs are clear to A&P. Cardiac examination is essentially unremarkable with regular rate and rhythm without murmur rub or thrill. Abdomen is benign with no organomegaly or masses noted. Motor sensory and DTR levels are equal and symmetric in the upper and lower extremities. Cranial nerves II through XII are grossly intact. Proprioception is intact. No peripheral adenopathy or edema is identified. No motor or sensory levels are noted. Crude visual fields are within normal range.  LABORATORY DATA: Pathology reports reviewed    RADIOLOGY RESULTS: No bone scan ordered based on low PSA value   IMPRESSION: Stage IIa (T1 CN 0 M0) Gleason 7 (3+4) adenocarcinoma prostate and healthy 66 year old male  PLAN: At this time I got over treatment options including surgery i.e. robotic prostatectomy, external beam radiation therapy and I-125  interstitial implant. Risks and benefits of all treatments were reviewed with the patient. He is leaning towards and has requested seed implantation. Risks and benefits of that procedure were again explained to the patient. Side effects including increased lower urinary tract symptoms possible diarrhea fatigue and risks of anesthesia all were discussed with the patient in detail. We will plan a volume study with urology followed by interstitial implant. Do not feel patient needs a loop Lupron therapy since this has not been shown to add significantly to I-125 interstitial implant outcomes. We'll coordinate with Dr. Cherrie Gauze office. Patient knows to call with any concerns.  I would like to take this opportunity to thank you for allowing me to participate in the care of your patient.Armstead Peaks., MD

## 2017-01-04 ENCOUNTER — Ambulatory Visit
Admission: RE | Admit: 2017-01-04 | Discharge: 2017-01-04 | Disposition: A | Discharge status: Home / Self Care | Payer: Medicare Other | Source: Ambulatory Visit | Attending: Radiation Oncology | Admitting: Radiation Oncology

## 2017-01-04 ENCOUNTER — Encounter: Payer: Self-pay | Admitting: Radiation Oncology

## 2017-01-04 VITALS — BP 142/84 | HR 99 | Temp 96.7°F | Resp 20 | Wt 209.1 lb

## 2017-01-04 DIAGNOSIS — C61 Malignant neoplasm of prostate: Secondary | ICD-10-CM | POA: Insufficient documentation

## 2017-01-04 DIAGNOSIS — Z510 Encounter for antineoplastic radiation therapy: Secondary | ICD-10-CM | POA: Diagnosis not present

## 2017-01-04 SURGERY — ULTRASOUND, PROSTATE, FOR VOLUME DETERMINATION
Anesthesia: Choice

## 2017-01-04 NOTE — Progress Notes (Signed)
Radiation Oncology Follow up Note  Name: Jared Herrera   Date:   12/21/2016 MRN:  [id number] DOB: [birth year]    This 65 y.o. male presents to the clinic today for volume study in anticipation of I-125 interstitial implant.  REFERRING PROVIDER: No ref. 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 He went under watchful waiting PSA jumped to 6. 2 repeat biopsy showed a Gleason 7 (3+4). Patient opted for I-125 interstitial implant is seen today for volume study he is doing well specifically denies any lower urinary tract symptoms he is seen today for volume study and doing well..  COMPLICATIONS OF TREATMENT: none  FOLLOW UP COMPLIANCE: keeps appointments   PHYSICAL EXAM:  There were no vitals taken for this visit. Well-developed well-nourished patient in NAD. HEENT reveals PERLA, EOMI, discs not visualized.  Oral cavity is clear. No oral mucosal lesions are identified. Neck is clear without evidence of cervical or supraclavicular adenopathy. Lungs are clear to A&P. Cardiac examination is essentially unremarkable with regular rate and rhythm without murmur rub or thrill. Abdomen is benign with no organomegaly or masses noted. Motor sensory and DTR levels are equal and symmetric in the upper and lower extremities. Cranial nerves II through XII are grossly intact. Proprioception is intact. No peripheral adenopathy or edema is identified. No motor or sensory levels are noted. Crude visual fields are within normal range.  RADIOLOGY RESULTS: Biopsy was done with ultrasound guidance  PLAN: Patient was taken to the cystoscopy suite in the OR. Patient was placed in the low lithotomy position. Foley catheter was placed. Trans-rectal ultrasound probe was inserted into the rectum and prostate seminal vesicles were visualized as well as bladder base. stepping images were performed on a 5 mm increments. Images will be  placed in [facility] treatment planning system to determine seed placement coordinates for eventual I-125 interstitial implant. Images will be reviewed with the physics and dosimetry staff for final quality approval. I personally was present for the volume study and assisted in delineation of contour volumes.  At the end of the procedure Foley catheter was removed, rectal ultrasound probe was removed. Patient tolerated his procedures extremely well with no side effects or complaints. Patient has given appointment for interstitial implant date. Consent was signed today as well as history and physical performed in preparation for his outpatient surgical implant.      Armstead Peaks., MD

## 2017-01-04 NOTE — H&P (Signed)
NEW PATIENT EVALUATION  Name: Jared Herrera  MRN: [id number]  Date:   01/04/2017     DOB: [birth year]   This 66 y.o. male patient presents to the clinic for history and physical prior to I-125 interstitial implant  REFERRING PHYSICIAN: Kathrine Haddock, NP  CHIEF COMPLAINT:  Chief Complaint  Patient presents with  . Prostate Cancer    Pt is here post volume study    DIAGNOSIS: There were no encounter diagnoses.   PREVIOUS INVESTIGATIONS: Pathology report reviewed Clinical notes reviewed Volume study performed  HPI: Patient is a 66 year old male diagnosed in 2017 with PSA 4.6 and 2 cores positive for Gleason 6 adenocarcinoma. He went under watchful waiting PSA jumped to 6. 2 repeat biopsy showed a Gleason 7 (3+4). Patient opted for I-125 interstitial implant is seen today for volume study he is doing well specifically denies any lower urinary tract symptoms. Volume study will be performed today.  PLANNED TREATMENT REGIMEN: I-125 interstitial implant  PAST MEDICAL HISTORY:  has a past medical history of Cataract; GERD (gastroesophageal reflux disease); Hypertension; and Sinus drainage.    PAST SURGICAL HISTORY:  Past Surgical History:  Procedure Laterality Date  . COLONOSCOPY WITH PROPOFOL N/A 04/05/2016   Procedure: COLONOSCOPY WITH PROPOFOL;  Surgeon: Lucilla Lame, MD;  Location: [place];  Service: Endoscopy;  Laterality: N/A;  . PROSTATE BIOPSY  March 2017   showed some cells, but they are slow growing  . TONSILLECTOMY AND ADENOIDECTOMY    . WISDOM TOOTH EXTRACTION      FAMILY HISTORY: family history includes Cancer in his sister; Heart disease in his brother; Hypertension in his brother and mother; Kidney disease in his daughter and mother; Lupus in his daughter.  SOCIAL HISTORY:  reports that he quit smoking about 38 years ago. His smoking use included Cigarettes. He has a 4.00 pack-year smoking history. He has never used smokeless tobacco. He reports that  he drinks alcohol. He reports that he does not use drugs.  ALLERGIES: Patient has no known allergies.  MEDICATIONS:  Current Outpatient Prescriptions  Medication Sig Dispense Refill  . aspirin 81 MG tablet Take 81 mg by mouth daily. Am, Reported on 11/11/2015    . lisinopril-hydrochlorothiazide (PRINZIDE,ZESTORETIC) 20-25 MG tablet Take 1 tablet by mouth daily. Increase from 20/12.5 (Patient taking differently: Take 1 tablet by mouth daily. Am,Increase from 20/12.5) 90 tablet 3  . loratadine (CLARITIN) 10 MG tablet Take 10 mg by mouth daily. am    . ranitidine (ZANTAC) 150 MG tablet Take 150 mg by mouth as needed.     Marland Kitchen Specialty Vitamins Products (ULTRA MAN PO) Take by mouth daily. Am,2 tablets daily     No current facility-administered medications for this encounter.     ECOG PERFORMANCE STATUS:  0 - Asymptomatic  REVIEW OF SYSTEMS:  Patient denies any weight loss, fatigue, weakness, fever, chills or night sweats. Patient denies any loss of vision, blurred vision. Patient denies any ringing  of the ears or hearing loss. No irregular heartbeat. Patient denies heart murmur or history of fainting. Patient denies any chest pain or pain radiating to her upper extremities. Patient denies any shortness of breath, difficulty breathing at night, cough or hemoptysis. Patient denies any swelling in the lower legs. Patient denies any nausea vomiting, vomiting of blood, or coffee ground material in the vomitus. Patient denies any stomach pain. Patient states has had normal bowel movements no significant constipation or diarrhea. Patient denies any dysuria, hematuria or significant nocturia. Patient denies  any problems walking, swelling in the joints or loss of balance. Patient denies any skin changes, loss of hair or loss of weight. Patient denies any excessive worrying or anxiety or significant depression. Patient denies any problems with insomnia. Patient denies excessive thirst, polyuria, polydipsia.  Patient denies any swollen glands, patient denies easy bruising or easy bleeding. Patient denies any recent infections, allergies or URI. Patient "s visual fields have not changed significantly in recent time.    PHYSICAL EXAM: BP (!) 142/84   Pulse 99   Temp (!) 96.7 F (35.9 C)   Resp 20   Wt 209 lb 1.7 oz (94.8 kg)   BMI 30.88 kg/m  Well-developed well-nourished patient in NAD. HEENT reveals PERLA, EOMI, discs not visualized.  Oral cavity is clear. No oral mucosal lesions are identified. Neck is clear without evidence of cervical or supraclavicular adenopathy. Lungs are clear to A&P. Cardiac examination is essentially unremarkable with regular rate and rhythm without murmur rub or thrill. Abdomen is benign with no organomegaly or masses noted. Motor sensory and DTR levels are equal and symmetric in the upper and lower extremities. Cranial nerves II through XII are grossly intact. Proprioception is intact. No peripheral adenopathy or edema is identified. No motor or sensory levels are noted. Crude visual fields are within normal range.  LABORATORY DATA: Pathology reports reviewed    RADIOLOGY RESULTS: Volume study performed   IMPRESSION: Stage IIa adenocarcinoma the prostate in 66 year old male for I-125 interstitial implant  PLAN: At this time patient is undergone volume study which he tolerated well. Please see volume study note. We are planning to deliver 145 gray to his prostatic volume. Treatment planning will be performed on the volume study to determine seed placement. Risks and benefits of procedure were reviewed with the patient and he seems to comprehend my treatment plan well. Radiation safety precautions also were reviewed.  I would like to take this opportunity to thank you for allowing me to participate in the care of your patient.Armstead Peaks., MD

## 2017-01-05 ENCOUNTER — Ambulatory Visit: Admission: RE | Admit: 2017-01-05 | Payer: Medicare Other | Source: Ambulatory Visit | Admitting: Urology

## 2017-01-11 DIAGNOSIS — C61 Malignant neoplasm of prostate: Secondary | ICD-10-CM | POA: Diagnosis not present

## 2017-01-11 DIAGNOSIS — Z510 Encounter for antineoplastic radiation therapy: Secondary | ICD-10-CM | POA: Diagnosis not present

## 2017-01-14 ENCOUNTER — Other Ambulatory Visit: Payer: Self-pay | Admitting: Radiology

## 2017-01-14 ENCOUNTER — Telehealth: Payer: Self-pay | Admitting: Radiology

## 2017-01-14 DIAGNOSIS — C61 Malignant neoplasm of prostate: Secondary | ICD-10-CM

## 2017-01-14 NOTE — Telephone Encounter (Signed)
Confirmed surgery & pre-admit testing appts with pt. Questions answered & pt voices understanding.

## 2017-01-18 ENCOUNTER — Ambulatory Visit
Admission: RE | Admit: 2017-01-18 | Discharge: 2017-01-18 | Disposition: A | Discharge status: Home / Self Care | Payer: Medicare Other | Source: Ambulatory Visit | Attending: Urology | Admitting: Urology

## 2017-01-18 ENCOUNTER — Ambulatory Visit (INDEPENDENT_AMBULATORY_CARE_PROVIDER_SITE_OTHER): Payer: Medicare Other | Admitting: Unknown Physician Specialty

## 2017-01-18 ENCOUNTER — Encounter: Payer: Self-pay | Admitting: Unknown Physician Specialty

## 2017-01-18 ENCOUNTER — Encounter
Admission: RE | Admit: 2017-01-18 | Discharge: 2017-01-18 | Disposition: A | Discharge status: Home / Self Care | Payer: Medicare Other | Source: Ambulatory Visit | Attending: Urology | Admitting: Urology

## 2017-01-18 VITALS — BP 135/80 | HR 96 | Temp 97.8°F | Ht 68.43 in | Wt 209.3 lb

## 2017-01-18 DIAGNOSIS — C61 Malignant neoplasm of prostate: Secondary | ICD-10-CM | POA: Diagnosis not present

## 2017-01-18 DIAGNOSIS — Z01812 Encounter for preprocedural laboratory examination: Secondary | ICD-10-CM | POA: Diagnosis not present

## 2017-01-18 DIAGNOSIS — Z01810 Encounter for preprocedural cardiovascular examination: Secondary | ICD-10-CM | POA: Insufficient documentation

## 2017-01-18 DIAGNOSIS — Z01818 Encounter for other preprocedural examination: Secondary | ICD-10-CM | POA: Insufficient documentation

## 2017-01-18 DIAGNOSIS — I10 Essential (primary) hypertension: Secondary | ICD-10-CM

## 2017-01-18 DIAGNOSIS — R7301 Impaired fasting glucose: Secondary | ICD-10-CM

## 2017-01-18 DIAGNOSIS — R251 Tremor, unspecified: Secondary | ICD-10-CM | POA: Diagnosis not present

## 2017-01-18 DIAGNOSIS — Z0000 Encounter for general adult medical examination without abnormal findings: Secondary | ICD-10-CM

## 2017-01-18 DIAGNOSIS — Z7189 Other specified counseling: Secondary | ICD-10-CM

## 2017-01-18 HISTORY — DX: Malignant (primary) neoplasm, unspecified: C80.1

## 2017-01-18 HISTORY — DX: Unspecified osteoarthritis, unspecified site: M19.90

## 2017-01-18 LAB — DIFFERENTIAL
BASOS ABS: 0 10*3/uL (ref 0–0.1)
Basophils Relative: 0 %
Eosinophils Absolute: 0 10*3/uL (ref 0–0.7)
Eosinophils Relative: 0 %
LYMPHS ABS: 1.9 10*3/uL (ref 1.0–3.6)
Lymphocytes Relative: 27 %
MONOS PCT: 11 %
Monocytes Absolute: 0.8 10*3/uL (ref 0.2–1.0)
NEUTROS ABS: 4.4 10*3/uL (ref 1.4–6.5)
Neutrophils Relative %: 62 %

## 2017-01-18 LAB — COMPREHENSIVE METABOLIC PANEL
ALT: 28 U/L (ref 17–63)
AST: 27 U/L (ref 15–41)
Albumin: 4 g/dL (ref 3.5–5.0)
Alkaline Phosphatase: 64 U/L (ref 38–126)
Anion gap: 9 (ref 5–15)
BUN: 16 mg/dL (ref 6–20)
CHLORIDE: 99 mmol/L — AB (ref 101–111)
CO2: 28 mmol/L (ref 22–32)
CREATININE: 0.74 mg/dL (ref 0.61–1.24)
Calcium: 9.2 mg/dL (ref 8.9–10.3)
GFR calc Af Amer: >60 mL/min (ref >60)
GFR calc non Af Amer: >60 mL/min (ref >60)
Glucose, Bld: 117 mg/dL — ABNORMAL HIGH (ref 65–99)
POTASSIUM: 3.4 mmol/L — AB (ref 3.5–5.1)
SODIUM: 136 mmol/L (ref 135–145)
Total Bilirubin: 1.4 mg/dL — ABNORMAL HIGH (ref 0.3–1.2)
Total Protein: 8.2 g/dL — ABNORMAL HIGH (ref 6.5–8.1)

## 2017-01-18 LAB — CBC
HCT: 42.8 % (ref 40.0–52.0)
HEMOGLOBIN: 14.1 g/dL (ref 13.0–18.0)
MCH: 30.3 pg (ref 26.0–34.0)
MCHC: 32.9 g/dL (ref 32.0–36.0)
MCV: 91.8 fL (ref 80.0–100.0)
PLATELETS: 303 10*3/uL (ref 150–440)
RBC: 4.66 MIL/uL (ref 4.40–5.90)
RDW: 13 % (ref 11.5–14.5)
WBC: 7.1 10*3/uL (ref 3.8–10.6)

## 2017-01-18 LAB — PROTIME-INR
INR: 1.13
Prothrombin Time: 14.6 seconds (ref 11.4–15.2)

## 2017-01-18 LAB — BAYER DCA HB A1C WAIVED: HB A1C (BAYER DCA - WAIVED): 6 % (ref <7.0)

## 2017-01-18 LAB — APTT: APTT: 33 s (ref 24–36)

## 2017-01-18 MED ORDER — LISINOPRIL-HYDROCHLOROTHIAZIDE 20-25 MG PO TABS: 1.0000 | ORAL_TABLET | Freq: Every day | ORAL | 1 refills | Status: DC

## 2017-01-18 NOTE — Assessment & Plan Note (Signed)
Will get radioactive seed implants.  Labs and chest x-ray done today

## 2017-01-18 NOTE — Addendum Note (Signed)
Addended by: Kathrine Haddock on: 01/18/2017 11:02 AM   Modules accepted: Orders

## 2017-01-18 NOTE — Progress Notes (Addendum)
BP 135/80 (BP Location: Left Arm, Cuff Size: Large)   Pulse 96   Temp 97.8 F (36.6 C)   Ht 5' 8.42" (1.738 m)   Wt 209 lb 4.8 oz (94.9 kg)   SpO2 98%   BMI 31.43 kg/m    Subjective:    Patient ID: Jared Herrera, male    DOB: 07-28-1951, 66 y.o.   MRN: [id number]  HPI: Jared Herrera is a 66 y.o. male  Chief Complaint  Patient presents with  . Medicare Wellness   Functional Status Survey: Is the patient deaf or have difficulty hearing?: No Does the patient have difficulty seeing, even when wearing glasses/contacts?: No Does the patient have difficulty concentrating, remembering, or making decisions?: No Does the patient have difficulty walking or climbing stairs?: No Does the patient have difficulty dressing or bathing?: No Does the patient have difficulty doing errands alone such as visiting a doctor's office or shopping?: No  Fall Risk  01/18/2017 01/16/2016 10/10/2015  Falls in the past year? No No No   Depression screen [facility] 2/9 01/18/2017 01/18/2017 01/16/2016 10/10/2015  Decreased Interest 0 0 0 0  Down, Depressed, Hopeless 0 0 0 0  PHQ - 2 Score 0 0 0 0  Altered sleeping 1 1 - -  Tired, decreased energy 0 0 - -  Change in appetite 0 0 - -  Feeling bad or failure about yourself  0 0 - -  Trouble concentrating 0 0 - -  Moving slowly or fidgety/restless 0 0 - -  Suicidal thoughts 0 0 - -  PHQ-9 Score 1 1 - -   Hypertension Using medications without difficulty Average home BPs   No problems or lightheadedness No chest pain with exertion or shortness of breath No Edema  IFG High normal blood sugar last visit.    Episode Pt had an episode when he woke up in the AM with tremors and palpitations.  States this resolved quickly after drinking.    GERD Noted at night.  Zantac seems to resolve this.  Takes most days of the week.    Depression screen [facility] 2/9 01/18/2017 01/18/2017 01/16/2016 10/10/2015  Decreased Interest 0 0 0 0  Down, Depressed, Hopeless 0 0  0 0  PHQ - 2 Score 0 0 0 0  Altered sleeping 1 1 - -  Tired, decreased energy 0 0 - -  Change in appetite 0 0 - -  Feeling bad or failure about yourself  0 0 - -  Trouble concentrating 0 0 - -  Moving slowly or fidgety/restless 0 0 - -  Suicidal thoughts 0 0 - -  PHQ-9 Score 1 1 - -   Prostate Cancer Treated with seed implants  Social History   Social History  . Marital status: Legally Separated    Spouse name: N/A  . Number of children: N/A  . Years of education: N/A   Occupational History  . Not on file.   Social History Main Topics  . Smoking status: Former Smoker    Packs/day: 0.50    Years: 8.00    Types: Cigarettes    Quit date: 10/11/1978  . Smokeless tobacco: Never Used  . Alcohol use 0.0 oz/week     Comment: occasional glass of wine  . Drug use: No  . Sexual activity: No   Other Topics Concern  . Not on file   Social History Narrative  . No narrative on file   Family History  Problem Relation  Age of Onset  . Heart disease Brother   . Hypertension Brother   . Hypertension Mother   . Kidney disease Mother   . Cancer Sister     thyroid  . Lupus Daughter   . Kidney disease Daughter   . COPD Neg Hx   . Diabetes Neg Hx   . Stroke Neg Hx    Past Medical History:  Diagnosis Date  . Arthritis   . Cancer [facility])    Prostate  . Cataract    bilateral  . GERD (gastroesophageal reflux disease)   . Hypertension    controlled on meds  . Sinus drainage    Past Surgical History:  Procedure Laterality Date  . COLONOSCOPY WITH PROPOFOL N/A 04/05/2016   Procedure: COLONOSCOPY WITH PROPOFOL;  Surgeon: Lucilla Lame, MD;  Location: [place];  Service: Endoscopy;  Laterality: N/A;  . PROSTATE BIOPSY  March 2017   showed some cells, but they are slow growing  . TONSILLECTOMY AND ADENOIDECTOMY    . WISDOM TOOTH EXTRACTION      Relevant past medical, surgical, family and social history reviewed and updated as indicated. Interim medical history since  our last visit reviewed. Allergies and medications reviewed and updated.  Review of Systems  Constitutional: Negative.   HENT: Negative.   Eyes: Negative.   Respiratory: Negative.   Cardiovascular: Negative.   Gastrointestinal: Negative.   Endocrine: Negative.   Genitourinary: Negative.   Skin: Negative.   Allergic/Immunologic: Negative.   Neurological: Negative.   Hematological: Negative.   Psychiatric/Behavioral: Negative.     Per HPI unless specifically indicated above     Objective:    BP 135/80 (BP Location: Left Arm, Cuff Size: Large)   Pulse 96   Temp 97.8 F (36.6 C)   Ht 5' 8.42" (1.738 m)   Wt 209 lb 4.8 oz (94.9 kg)   SpO2 98%   BMI 31.43 kg/m   Wt Readings from Last 3 Encounters:  01/18/17 209 lb 4.8 oz (94.9 kg)  01/18/17 215 lb (97.5 kg)  01/04/17 209 lb 1.7 oz (94.8 kg)    Physical Exam  Constitutional: He is oriented to person, place, and time. He appears well-developed and well-nourished. No distress.  HENT:  Head: Normocephalic and atraumatic.  Eyes: Conjunctivae and lids are normal. Right eye exhibits no discharge. Left eye exhibits no discharge. No scleral icterus.  Neck: Normal range of motion. Neck supple. No JVD present. Carotid bruit is not present.  Cardiovascular: Normal rate, regular rhythm and normal heart sounds.   Pulmonary/Chest: Effort normal and breath sounds normal. No respiratory distress.  Abdominal: Normal appearance. There is no splenomegaly or hepatomegaly.  Musculoskeletal: Normal range of motion.  Neurological: He is alert and oriented to person, place, and time.  Skin: Skin is warm, dry and intact. No rash noted. No pallor.  Psychiatric: He has a normal mood and affect. His behavior is normal. Judgment and thought content normal.       Assessment & Plan:   Problem List Items Addressed This Visit      Unprioritized   Advanced care planning/counseling discussion    A voluntary discussion about advance care planning  including the explanation and discussion of advance directives was extensively discussed  with the patient.  Explanation about the health care proxy and Living will was reviewed and packet with forms with explanation of how to fill them out was given.  During this discussion, the patient was able to identify a health care proxy  as Tiffany and plans to fill out the paperwork required.  Patient was offered a separate [place] visit for further assistance with forms.         Hypertension    Stable, continue present medications.        Relevant Medications   lisinopril-hydrochlorothiazide (PRINZIDE,ZESTORETIC) 20-25 MG tablet   Other Relevant Orders   Lipid Panel w/o Chol/HDL Ratio   IFG (impaired fasting glucose)    Check Hgb A1C      Relevant Orders   Bayer DCA Hb A1c Waived   Prostate cancer ([place])    Will get radioactive seed implants.  Labs and chest x-ray done today       Other Visit Diagnoses    Annual physical exam    -  Primary   Occasional tremors       New problem.  Recalls one episode that resolved.  Check TSH   Relevant Orders   Bayer DCA Hb A1c Waived   TSH       Follow up plan: Return in about 6 months (around 07/20/2017).

## 2017-01-18 NOTE — Assessment & Plan Note (Signed)
A voluntary discussion about advance care planning including the explanation and discussion of advance directives was extensively discussed  with the patient.  Explanation about the health care proxy and Living will was reviewed and packet with forms with explanation of how to fill them out was given.  During this discussion, the patient was able to identify a health care proxy as [name] and plans to fill out the paperwork required.  Patient was offered a separate [place] visit for further assistance with forms.

## 2017-01-18 NOTE — Assessment & Plan Note (Signed)
Stable, continue present medications.   

## 2017-01-18 NOTE — Assessment & Plan Note (Signed)
Check Hgb A1C 

## 2017-01-18 NOTE — Patient Instructions (Signed)
Your procedure is scheduled on: February 01, 2017 (Tuesday) Report to Same Day Surgery 2nd floor medical mall [facility] Entrance-take elevator on left to 2nd floor.  Check in with surgery information desk.) To find out your arrival time please call [phone] between 1PM - 3PM on January 31, 2017 (Monday)   Remember: Instructions that are not followed completely may result in serious medical risk, up to and including death, or upon the discretion of your surgeon and anesthesiologist your surgery may need to be rescheduled.    _x___ 1. Do not eat food or drink liquids after midnight. No gum chewing or  hard candies                              __x__ 2. No Alcohol for 24 hours before or after surgery.   __x__3. No Smoking for 24 prior to surgery.   ____  4. Bring all medications with you on the day of surgery if instructed.    __x__ 5. Notify your doctor if there is any change in your medical condition     (cold, fever, infections).     Do not wear jewelry, make-up, hairpins, clips or nail polish.  Do not wear lotions, powders, or perfumes. You may wear deodorant.  Do not shave 48 hours prior to surgery. Men may shave face and neck.  Do not bring valuables to the hospital.    [facility] is not responsible for any belongings or valuables.               Contacts, dentures or bridgework may not be worn into surgery.  Leave your suitcase in the car. After surgery it may be brought to your room.  For patients admitted to the hospital, discharge time is determined by your  treatment team                      Patients discharged the day of surgery will not be allowed to drive home.  You will need someone to drive you home and stay with you the night of your procedure.    Please read over the following fact sheets that you were given:   [facility] Preparing for Surgery and or MRSA Information   _x___ Take anti-hypertensive (unless it includes a diuretic), cardiac, seizure, asthma,      anti-reflux and psychiatric medicines. These include:  1. Zantac (Zantac the night before surgery )  2.  3.  4.  5.  6.  _x___Fleets enema or Magnesium Citrate as directed. (Fleet Enema early Tuesday morning prior to surgery )   ___ Use CHG Soap or sage wipes as directed on instruction sheet   ____ Use inhalers on the day of surgery and bring to hospital day of surgery  ____ Stop Metformin and Janumet 2 days prior to surgery.    ____ Take 1/2 of usual insulin dose the night before surgery and none on the morning     surgery.   _x___ Follow recommendations from Cardiologist, Pulmonologist or PCP regarding          stopping Aspirin, Coumadin, Pllavix ,Eliquis, Effient, or Pradaxa, and Pletal. (Patient stopped Aspirin today January 18, 2017)  X____Stop Anti-inflammatories such as Advil, Aleve, Ibuprofen, Motrin, Naproxen, Naprosyn, Goodies powders or aspirin products. OK to take Tylenol   _x___ Stop supplements until after surgery.  But may continue Vitamin D, Vitamin B,  and multivitamin.   ____ Bring C-Pap to the hospital.

## 2017-01-19 ENCOUNTER — Encounter: Payer: Self-pay | Admitting: Unknown Physician Specialty

## 2017-01-19 LAB — LIPID PANEL W/O CHOL/HDL RATIO
Cholesterol, Total: 169 mg/dL (ref 100–199)
HDL: 39 mg/dL — ABNORMAL LOW (ref >39)
LDL CALC: 115 mg/dL — AB (ref 0–99)
Triglycerides: 77 mg/dL (ref 0–149)
VLDL CHOLESTEROL CAL: 15 mg/dL (ref 5–40)

## 2017-01-19 LAB — TSH: TSH: 0.908 u[IU]/mL (ref 0.450–4.500)

## 2017-01-26 ENCOUNTER — Encounter: Payer: Self-pay | Admitting: Urology

## 2017-01-26 ENCOUNTER — Ambulatory Visit (INDEPENDENT_AMBULATORY_CARE_PROVIDER_SITE_OTHER): Payer: Medicare Other | Admitting: Urology

## 2017-02-01 ENCOUNTER — Encounter: Payer: Self-pay | Admitting: *Deleted

## 2017-02-01 ENCOUNTER — Ambulatory Visit
Admission: RE | Admit: 2017-02-01 | Discharge: 2017-02-01 | Disposition: A | Discharge status: Home / Self Care | Payer: Medicare Other | Source: Ambulatory Visit | Attending: Urology | Admitting: Urology

## 2017-02-01 ENCOUNTER — Encounter
Admission: RE | Disposition: A | Discharge status: Home / Self Care | Payer: Self-pay | Source: Ambulatory Visit | Attending: Urology

## 2017-02-01 ENCOUNTER — Ambulatory Visit: Payer: Medicare Other | Admitting: Anesthesiology

## 2017-02-01 ENCOUNTER — Ambulatory Visit
Admission: RE | Admit: 2017-02-01 | Discharge: 2017-02-01 | Disposition: A | Discharge status: Home / Self Care | Payer: Medicare Other | Source: Ambulatory Visit | Attending: Radiation Oncology | Admitting: Radiation Oncology

## 2017-02-01 DIAGNOSIS — Z7982 Long term (current) use of aspirin: Secondary | ICD-10-CM | POA: Insufficient documentation

## 2017-02-01 DIAGNOSIS — Z79899 Other long term (current) drug therapy: Secondary | ICD-10-CM | POA: Insufficient documentation

## 2017-02-01 DIAGNOSIS — K219 Gastro-esophageal reflux disease without esophagitis: Secondary | ICD-10-CM | POA: Insufficient documentation

## 2017-02-01 DIAGNOSIS — I10 Essential (primary) hypertension: Secondary | ICD-10-CM | POA: Insufficient documentation

## 2017-02-01 DIAGNOSIS — C61 Malignant neoplasm of prostate: Secondary | ICD-10-CM | POA: Insufficient documentation

## 2017-02-01 HISTORY — PX: RADIOACTIVE SEED IMPLANT: SHX5150

## 2017-02-01 SURGERY — INSERTION, RADIATION SOURCE, PROSTATE
Anesthesia: General | Wound class: Clean Contaminated

## 2017-02-01 MED ORDER — LIDOCAINE HCL (CARDIAC) 20 MG/ML IV SOLN
INTRAVENOUS | Status: DC | PRN
  Administered 2017-02-01: 100 mg via INTRAVENOUS

## 2017-02-01 MED ORDER — FENTANYL CITRATE (PF) 100 MCG/2ML IJ SOLN
INTRAMUSCULAR | Status: DC | PRN
  Administered 2017-02-01: 50 ug via INTRAVENOUS

## 2017-02-01 MED ORDER — FENTANYL CITRATE (PF) 100 MCG/2ML IJ SOLN
INTRAMUSCULAR | Status: AC
  Filled 2017-02-01: qty 2

## 2017-02-01 MED ORDER — SEVOFLURANE IN SOLN
RESPIRATORY_TRACT | Status: AC
  Filled 2017-02-01: qty 250

## 2017-02-01 MED ORDER — ONDANSETRON HCL 4 MG/2ML IJ SOLN
INTRAMUSCULAR | Status: DC | PRN
  Administered 2017-02-01: 4 mg via INTRAVENOUS

## 2017-02-01 MED ORDER — PROPOFOL 10 MG/ML IV BOLUS
INTRAVENOUS | Status: DC | PRN
  Administered 2017-02-01: 180 mg via INTRAVENOUS

## 2017-02-01 MED ORDER — HYDROCODONE-ACETAMINOPHEN 5-325 MG PO TABS: 1.0000 | ORAL_TABLET | Freq: Four times a day (QID) | ORAL | 0 refills | Status: DC | PRN

## 2017-02-01 MED ORDER — PROPOFOL 10 MG/ML IV BOLUS
INTRAVENOUS | Status: AC
  Filled 2017-02-01: qty 20

## 2017-02-01 MED ORDER — BACITRACIN 500 UNIT/GM EX OINT
TOPICAL_OINTMENT | CUTANEOUS | Status: DC | PRN
  Administered 2017-02-01: 1 via TOPICAL

## 2017-02-01 MED ORDER — FENTANYL CITRATE (PF) 100 MCG/2ML IJ SOLN: 25.0000 ug | INTRAMUSCULAR | Status: DC | PRN

## 2017-02-01 MED ORDER — DOCUSATE SODIUM 100 MG PO CAPS: 100.0000 mg | ORAL_CAPSULE | Freq: Two times a day (BID) | ORAL | 0 refills | Status: DC

## 2017-02-01 MED ORDER — CEFAZOLIN SODIUM-DEXTROSE 1-4 GM/50ML-% IV SOLN
1.0000 g | INTRAVENOUS | Status: AC
  Administered 2017-02-01: 1 g via INTRAVENOUS

## 2017-02-01 MED ORDER — FLEET ENEMA 7-19 GM/118ML RE ENEM: 1.0000 | ENEMA | Freq: Once | RECTAL | Status: DC

## 2017-02-01 MED ORDER — TAMSULOSIN HCL 0.4 MG PO CAPS: 0.4000 mg | ORAL_CAPSULE | Freq: Every day | ORAL | 0 refills | Status: DC

## 2017-02-01 MED ORDER — CEFAZOLIN SODIUM 1 G IJ SOLR
INTRAMUSCULAR | Status: DC | PRN
  Administered 2017-02-01: 1 g via INTRAMUSCULAR

## 2017-02-01 MED ORDER — ONDANSETRON HCL 4 MG/2ML IJ SOLN
INTRAMUSCULAR | Status: AC
  Filled 2017-02-01: qty 2

## 2017-02-01 MED ORDER — OXYCODONE HCL 5 MG/5ML PO SOLN: 5.0000 mg | Freq: Once | ORAL | Status: DC | PRN

## 2017-02-01 MED ORDER — LACTATED RINGERS IV SOLN
INTRAVENOUS | Status: DC
  Administered 2017-02-01 (×2): via INTRAVENOUS

## 2017-02-01 MED ORDER — OXYCODONE HCL 5 MG PO TABS: 5.0000 mg | ORAL_TABLET | Freq: Once | ORAL | Status: DC | PRN

## 2017-02-01 MED ORDER — BACITRACIN ZINC 500 UNIT/GM EX OINT
TOPICAL_OINTMENT | CUTANEOUS | Status: AC
  Filled 2017-02-01: qty 28.35

## 2017-02-01 MED ORDER — CIPROFLOXACIN HCL 500 MG PO TABS: 500.0000 mg | ORAL_TABLET | Freq: Two times a day (BID) | ORAL | 0 refills | Status: DC

## 2017-02-01 SURGICAL SUPPLY — 25 items
BAG URO DRAIN 2000ML W/SPOUT (MISCELLANEOUS) ×3 IMPLANT
BLADE CLIPPER SURG (BLADE) ×3 IMPLANT
CATH FOL 2WAY LX 16X5 (CATHETERS) ×3 IMPLANT
DRAPE INCISE 23X17 IOBAN STRL (DRAPES) ×4
DRAPE INCISE IOBAN 23X17 STRL (DRAPES) ×2 IMPLANT
DRAPE SHEET LG 3/4 BI-LAMINATE (DRAPES) ×3 IMPLANT
DRAPE TABLE BACK 80X90 (DRAPES) ×3 IMPLANT
DRAPE UNDER BUTTOCK W/FLU (DRAPES) ×3 IMPLANT
DRSG TELFA 3X8 NADH (GAUZE/BANDAGES/DRESSINGS) ×3 IMPLANT
GLOVE BIO SURGEON STRL SZ 6.5 (GLOVE) ×4 IMPLANT
GLOVE BIO SURGEON STRL SZ7.5 (GLOVE) ×6 IMPLANT
GLOVE BIO SURGEONS STRL SZ 6.5 (GLOVE) ×2
GOWN STRL REUS W/ TWL LRG LVL3 (GOWN DISPOSABLE) ×2 IMPLANT
GOWN STRL REUS W/ TWL XL LVL3 (GOWN DISPOSABLE) ×1 IMPLANT
GOWN STRL REUS W/TWL LRG LVL3 (GOWN DISPOSABLE) ×4
GOWN STRL REUS W/TWL XL LVL3 (GOWN DISPOSABLE) ×2
IV NS 1000ML (IV SOLUTION) ×2
IV NS 1000ML BAXH (IV SOLUTION) ×1 IMPLANT
KIT RM TURNOVER CYSTO AR (KITS) ×3 IMPLANT
PACK CYSTO AR (MISCELLANEOUS) ×3 IMPLANT
SET CYSTO W/LG BORE CLAMP LF (SET/KITS/TRAYS/PACK) ×3 IMPLANT
SURGILUBE 2OZ TUBE FLIPTOP (MISCELLANEOUS) ×3 IMPLANT
SYRINGE 10CC LL (SYRINGE) ×3 IMPLANT
SYRINGE IRR TOOMEY STRL 70CC (SYRINGE) IMPLANT
WATER STERILE IRR 1000ML POUR (IV SOLUTION) ×3 IMPLANT

## 2017-02-01 NOTE — Anesthesia Preprocedure Evaluation (Signed)
Anesthesia Evaluation  Patient identified by MRN, date of birth, ID band Patient awake    Reviewed: Allergy & Precautions, H&P , NPO status , Patient's Chart, lab work & pertinent test results  History of Anesthesia Complications Negative for: history of anesthetic complications  Airway Mallampati: III  TM Distance: >3 FB Neck ROM: limited    Dental  (+) Chipped, Caps   Pulmonary neg shortness of breath, former smoker,    Pulmonary exam normal breath sounds clear to auscultation       Cardiovascular Exercise Tolerance: Good hypertension, (-) angina(-) Past MI and (-) DOE Normal cardiovascular exam Rhythm:regular Rate:Normal     Neuro/Psych negative neurological ROS  negative psych ROS   GI/Hepatic Neg liver ROS, GERD  Medicated and Controlled,  Endo/Other  negative endocrine ROS  Renal/GU      Musculoskeletal  (+) Arthritis ,   Abdominal   Peds  Hematology negative hematology ROS (+)   Anesthesia Other Findings Past Medical History: No date: Arthritis No date: Cancer ([place])     Comment: Prostate No date: Cataract     Comment: bilateral No date: GERD (gastroesophageal reflux disease) No date: Hypertension     Comment: controlled on meds No date: Sinus drainage  Past Surgical History: 04/05/2016: COLONOSCOPY WITH PROPOFOL N/A     Comment: Procedure: COLONOSCOPY WITH PROPOFOL;                Surgeon: Lucilla Lame, MD;  Location: [facility];  Service: Endoscopy;  Laterality:              N/A; March 2017: PROSTATE BIOPSY     Comment: showed some cells, but they are slow growing No date: TONSILLECTOMY AND ADENOIDECTOMY No date: WISDOM TOOTH EXTRACTION     Reproductive/Obstetrics negative OB ROS                             Anesthesia Physical Anesthesia Plan  ASA: III  Anesthesia Plan: General LMA   Post-op Pain Management:    Induction:  Intravenous  Airway Management Planned: LMA  Additional Equipment:   Intra-op Plan:   Post-operative Plan: Extubation in OR  Informed Consent: I have reviewed the patients History and Physical, chart, labs and discussed the procedure including the risks, benefits and alternatives for the proposed anesthesia with the patient or authorized representative who has indicated his/her understanding and acceptance.   Dental Advisory Given  Plan Discussed with: Anesthesiologist, CRNA and Surgeon  Anesthesia Plan Comments: (Patient consented for risks of anesthesia including but not limited to:  - adverse reactions to medications - damage to teeth, lips or other oral mucosa - sore throat or hoarseness - Damage to heart, brain, lungs or loss of life  Patient voiced understanding.)        Anesthesia Quick Evaluation

## 2017-02-01 NOTE — H&P (View-Only) (Signed)
Radiation Oncology Follow up Note  Name: Jared Herrera   Date:   12/21/2016 MRN:  [id number] DOB: 01-03-1951    This 66 y.o. male presents to the clinic today for volume study in anticipation of I-125 interstitial implant.  REFERRING PROVIDER: No ref. 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 He went under watchful waiting PSA jumped to 6. 2 repeat biopsy showed a Gleason 7 (3+4). Patient opted for I-125 interstitial implant is seen today for volume study he is doing well specifically denies any lower urinary tract symptoms he is seen today for volume study and doing well..  COMPLICATIONS OF TREATMENT: none  FOLLOW UP COMPLIANCE: keeps appointments   PHYSICAL EXAM:  There were no vitals taken for this visit. Well-developed well-nourished patient in NAD. HEENT reveals PERLA, EOMI, discs not visualized.  Oral cavity is clear. No oral mucosal lesions are identified. Neck is clear without evidence of cervical or supraclavicular adenopathy. Lungs are clear to A&P. Cardiac examination is essentially unremarkable with regular rate and rhythm without murmur rub or thrill. Abdomen is benign with no organomegaly or masses noted. Motor sensory and DTR levels are equal and symmetric in the upper and lower extremities. Cranial nerves II through XII are grossly intact. Proprioception is intact. No peripheral adenopathy or edema is identified. No motor or sensory levels are noted. Crude visual fields are within normal range.  RADIOLOGY RESULTS: Biopsy was done with ultrasound guidance  PLAN: Patient was taken to the cystoscopy suite in the OR. Patient was placed in the low lithotomy position. Foley catheter was placed. Trans-rectal ultrasound probe was inserted into the rectum and prostate seminal vesicles were visualized as well as bladder base. stepping images were performed on a 5 mm increments. Images will be  placed in BrachyVision treatment planning system to determine seed placement coordinates for eventual I-125 interstitial implant. Images will be reviewed with the physics and dosimetry staff for final quality approval. I personally was present for the volume study and assisted in delineation of contour volumes.  At the end of the procedure Foley catheter was removed, rectal ultrasound probe was removed. Patient tolerated his procedures extremely well with no side effects or complaints. Patient has given appointment for interstitial implant date. Consent was signed today as well as history and physical performed in preparation for his outpatient surgical implant.      Armstead Peaks., MD

## 2017-02-01 NOTE — Discharge Instructions (Signed)
AMBULATORY SURGERY  DISCHARGE INSTRUCTIONS   1) The drugs that you were given will stay in your system until tomorrow so for the next 24 hours you should not:  A) Drive an automobile B) Make any legal decisions C) Drink any alcoholic beverage   2) You may resume regular meals tomorrow.  Today it is better to start with liquids and gradually work up to solid foods.  You may eat anything you prefer, but it is better to start with liquids, then soup and crackers, and gradually work up to solid foods.   3) Please notify your doctor immediately if you have any unusual bleeding, trouble breathing, redness and pain at the surgery site, drainage, fever, or pain not relieved by medication. 4)   5) Your post-operative visit with Dr.                                     is: Date:                        Time:    Please call to schedule your post-operative visit.  6) Additional Instructions:       Brachytherapy for Prostate Cancer, Care After Refer to this sheet in the next few weeks. These instructions provide you with information on caring for yourself after your procedure. Your health care provider may also give you more specific instructions. Your treatment has been planned according to current medical practices, but problems sometimes occur. Call your health care provider if you have any problems or questions after your procedure. What can I expect after the procedure? The area behind the scrotum will probably be tender and bruised. For a short period of time you may have:  Difficulty passing urine. You may need a catheter for a few days to a month.  Blood in the urine or semen.  A feeling of constipation because of prostate swelling.  Frequent feeling of an urgent need to urinate. For a long period of time you may have:  Inflammation of the rectum. This happens in about 2% of people who have the procedure.  Erection problems. These vary with age and occur in about 15-40% of  men.  Difficulty urinating. This is caused by scarring in the urethra.  Diarrhea. Follow these instructions at home:  Take medicines only as directed by your health care provider.  You will probably have a catheter in your bladder for several days. You will have blood in the urine bag and should drink a lot of fluids to keep it a light red color.  Keep all follow-up visits as directed by your health care provider. If you have a catheter, it will be removed during one of these visits.  Try not to sit directly on the area behind the scrotum. A soft cushion can decrease the discomfort. Ice packs may also be helpful for the discomfort. Do not put ice directly on the skin.  Shower and wash the area behind the scrotum gently. Do not sit in a tub.  If you have had the brachytherapy that uses the seeds, limit your close contact with children and pregnant women for 2 months because of the radiation still in the prostate. After that period of time, the levels drop off quickly. Get help right away if:  You have a fever.  You have chills.  You have shortness of breath.  You have  chest pain.  You have thick blood, like tomato juice, in the urine bag.  Your catheter is blocked so urine cannot get into the bag. Your bladder area or lower abdomen may be swollen.  There is excessive bleeding from your rectum. It is normal to have a little blood mixed with your stool.  There is severe discomfort in the treated area that does not go away with pain medicine.  You have abdominal discomfort.  You have severe nausea or vomiting.  You develop any new or unusual symptoms. This information is not intended to replace advice given to you by your health care provider. Make sure you discuss any questions you have with your health care provider. Document Released: 10/30/2010 Document Revised: 03/10/2016 Document Reviewed: 03/20/2013 Elsevier Interactive Patient Education  2017 [employer].

## 2017-02-01 NOTE — Op Note (Signed)
02/01/17  Preoperative diagnosis: Adenocarcinoma of the prostate   Postoperative diagnosis: Same   Procedure: I-125 prostate seed implantation, cystoscopy  Surgeon: Hollice Espy M.D.  Radiation Oncologist: Lavena Stanford, M.D.   Anesthesia: General  Drains: none  Complications: none  Indications:  66 yo M with intermediate risk prostate cancer scheduled for I-125 seed placement  Procedure: Patient was brought to operating suite and placement table in the supine position. At this time, a universal timeout protocol was performed, all team members were identified, Venodyne boots are placed, and he was administered IV Ancef in the preoperative period. He was placed in lithotomy position and prepped and draped in usual manner. Radiation oncology department placed a transrectal ultrasound probe anchoring stand/ grid and aligned with previous imaging from the volume study. Foley catheter was inserted without difficulty.  All needle passage was done with real-time transrectal ultrasound guidance in both the transverse and sagittal plains in order to achieve the desired preplanned position. A total of 17 needles were placed.  17 active seeds were implanted. The Foley catheter was removed and a rigid cystoscopy failed to show any seeds outside the prostate without evidence of trauma to the urethral, prostatic fossa, or bladder.  The bladder was drained.  A fluoroscopic image was then obtained showing excellent distrubution of the brachytherapy seeds.  Each seed was counted and counts were correct.    The patient was then repositioned in the supine position, reversed from anesthesia, and taken to the PACU in stable condition.   Hollice Espy, MD

## 2017-02-01 NOTE — Interval H&P Note (Signed)
History and Physical Interval Note:  02/01/2017 7:29 AM  Jared Herrera  has presented today for surgery, with the diagnosis of PROSTATE CANCER  The various methods of treatment have been discussed with the patient and family. After consideration of risks, benefits and other options for treatment, the patient has consented to  Procedure(s): RADIOACTIVE SEED IMPLANT/BRACHYTHERAPY IMPLANT (N/A) as a surgical intervention .  The patient's history has been reviewed, patient examined, no change in status, stable for surgery.  I have reviewed the patient's chart and labs.  Questions were answered to the patient's satisfaction.    RRR CTAB   Hollice Espy

## 2017-02-01 NOTE — Progress Notes (Signed)
Radiation Oncology Operative Note  Name: Jared Herrera   Date:   12/21/2016 MRN:  [id number] DOB: [birth year]    This 66 y.o. male presents to the hospital today for I-125 interstitial implant for prostate cancer  REFERRING PROVIDER: No ref. provider found  HPI: Patient is a 66 year old male presented with elevated PSA of 4.6 biopsy positive for Gleason 6 adenocarcinoma back in 2017. PSA rose to 6. 2 repeat biopsy was positive for Gleason 7 (3+4). Patient underwent volume study in anticipation of I-125 interstitial implant. He is seen today for that procedure.  COMPLICATIONS OF TREATMENT: none  FOLLOW UP COMPLIANCE: keeps appointments   PHYSICAL EXAM:  BP 113/85   Pulse 68   Temp (P) 97 F (36.1 C) (Temporal)   Resp 16   SpO2 96%  Well-developed well-nourished patient in NAD. HEENT reveals PERLA, EOMI, discs not visualized.  Oral cavity is clear. No oral mucosal lesions are identified. Neck is clear without evidence of cervical or supraclavicular adenopathy. Lungs are clear to A&P. Cardiac examination is essentially unremarkable with regular rate and rhythm without murmur rub or thrill. Abdomen is benign with no organomegaly or masses noted. Motor sensory and DTR levels are equal and symmetric in the upper and lower extremities. Cranial nerves II through XII are grossly intact. Proprioception is intact. No peripheral adenopathy or edema is identified. No motor or sensory levels are noted. Crude visual fields are within normal range.  RADIOLOGY RESULTS: Ultrasound use for procedure and final film had accurate count of 57 sources  PLAN: patient was taken to the operating room and general anesthesia was administered. Legs were immobilized in stirrups and patient was positioned in the exact same proportions as original volume study. Patient was prepped and Foley catheter was placed. Ultrasound guidance identified the prostate and recreated the original set up as per treatment planning  volume study. 19 needles were placed under ultrasound guidance with PVCs delivered to the prostate volume. After completion of procedure cystoscopy was performed by urology and no evidence of seeds in the bladder were noted. Patient tolerated the procedure extremely well. Initial plain film as doublecheck identified 57 seeds in the prostate. Patient has followup appointment in one month for CT scan for quality assurance will be performed.     Armstead Peaks., MD

## 2017-02-01 NOTE — Transfer of Care (Signed)
Immediate Anesthesia Transfer of Care Note  Patient: Jared Herrera  Procedure(s) Performed: Procedure(s): RADIOACTIVE SEED IMPLANT/BRACHYTHERAPY IMPLANT (N/A)  Patient Location: PACU  Anesthesia Type:General  Level of Consciousness: awake, alert  and oriented  Airway & Oxygen Therapy: Patient Spontanous Breathing  Post-op Assessment: Report given to RN and Post -op Vital signs reviewed and stable  Post vital signs: Reviewed and stable  Last Vitals:  Vitals:   02/01/17 0615  BP: 109/84  Pulse: 100  Resp: 16  Temp: 36.6 C    Last Pain:  Vitals:   02/01/17 0615  TempSrc: Tympanic         Complications: No apparent anesthesia complications

## 2017-02-01 NOTE — Anesthesia Post-op Follow-up Note (Cosign Needed)
Anesthesia QCDR form completed.        

## 2017-02-01 NOTE — OR Nursing (Signed)
Voided 125 cc of bloody urine. No seed noted. residual urine per bladder scanner 54 cc

## 2017-02-01 NOTE — Anesthesia Postprocedure Evaluation (Signed)
Anesthesia Post Note  Patient: Jared Herrera  Procedure(s) Performed: Procedure(s) (LRB): RADIOACTIVE SEED IMPLANT/BRACHYTHERAPY IMPLANT (N/A)  Patient location during evaluation: PACU Anesthesia Type: General Level of consciousness: awake and alert Pain management: pain level controlled Vital Signs Assessment: post-procedure vital signs reviewed and stable Respiratory status: spontaneous breathing, nonlabored ventilation, respiratory function stable and patient connected to nasal cannula oxygen Cardiovascular status: blood pressure returned to baseline and stable Postop Assessment: no signs of nausea or vomiting Anesthetic complications: no     Last Vitals:  Vitals:   02/01/17 0931 02/01/17 1032  BP: (P) 120/78 113/85  Pulse: (P) 72 68  Resp: (P) 16 16  Temp: (P) 36.1 C     Last Pain:  Vitals:   02/01/17 0931  TempSrc: (P) Temporal                 Jared Herrera

## 2017-02-01 NOTE — Anesthesia Procedure Notes (Signed)
Procedure Name: LMA Insertion Date/Time: 02/01/2017 7:48 AM Performed by: [facility] Pre-anesthesia Checklist: Patient identified, Emergency Drugs available, Suction available and Patient being monitored Patient Re-evaluated:Patient Re-evaluated prior to inductionOxygen Delivery Method: Circle system utilized Preoxygenation: Pre-oxygenation with 100% oxygen Intubation Type: IV induction Ventilation: Mask ventilation without difficulty LMA Size: 4.5 Number of attempts: 1 Placement Confirmation: ETT inserted through vocal cords under direct vision,  positive ETCO2,  CO2 detector and breath sounds checked- equal and bilateral Dental Injury: Teeth and Oropharynx as per pre-operative assessment

## 2017-02-02 NOTE — Progress Notes (Signed)
Note opened in error.

## 2017-03-01 ENCOUNTER — Ambulatory Visit
Admission: RE | Admit: 2017-03-01 | Discharge: 2017-03-01 | Disposition: A | Discharge status: Home / Self Care | Payer: Medicare Other | Source: Ambulatory Visit | Attending: Radiation Oncology | Admitting: Radiation Oncology

## 2017-03-01 ENCOUNTER — Other Ambulatory Visit: Payer: Self-pay | Admitting: *Deleted

## 2017-03-01 ENCOUNTER — Encounter: Payer: Self-pay | Admitting: Radiation Oncology

## 2017-03-01 VITALS — BP 139/83 | HR 93 | Temp 96.6°F | Resp 20 | Wt 208.9 lb

## 2017-03-01 DIAGNOSIS — C61 Malignant neoplasm of prostate: Secondary | ICD-10-CM | POA: Diagnosis not present

## 2017-03-01 DIAGNOSIS — R351 Nocturia: Secondary | ICD-10-CM | POA: Insufficient documentation

## 2017-03-01 DIAGNOSIS — Z510 Encounter for antineoplastic radiation therapy: Secondary | ICD-10-CM | POA: Diagnosis not present

## 2017-03-01 DIAGNOSIS — Z923 Personal history of irradiation: Secondary | ICD-10-CM | POA: Insufficient documentation

## 2017-03-01 NOTE — Progress Notes (Signed)
Radiation Oncology Follow up Note  Name: Jared Herrera   Date:   03/01/2017 MRN:  [id number] DOB: 03-May-1951    This 66 y.o. male presents to the clinic today for one-month follow-up status post I-125 interstitial implant for Gleason 6 adenocarcinoma the prostate.  REFERRING PROVIDER: Kathrine Haddock, NP  HPI: Patient is a 66 year old male 1 month out having completed I-125 interstitial implant for adenocarcinoma the prostate. He is doing well. He specifically denies diarrhea dysuria or any other GI/GU complaints. He has nocturia 1..  COMPLICATIONS OF TREATMENT: none  FOLLOW UP COMPLIANCE: keeps appointments   PHYSICAL EXAM:  BP 139/83   Pulse 93   Temp (!) 96.6 F (35.9 C)   Resp 20   Wt 208 lb 14.2 oz (94.7 kg)   BMI 30.85 kg/m  On rectal exam rectal sphincter tone is good. Prostate is smooth contracted without evidence of nodularity or mass. Sulcus is preserved bilaterally. No discrete nodularity is identified. No other rectal abnormalities are noted. Well-developed well-nourished patient in NAD. HEENT reveals PERLA, EOMI, discs not visualized.  Oral cavity is clear. No oral mucosal lesions are identified. Neck is clear without evidence of cervical or supraclavicular adenopathy. Lungs are clear to A&P. Cardiac examination is essentially unremarkable with regular rate and rhythm without murmur rub or thrill. Abdomen is benign with no organomegaly or masses noted. Motor sensory and DTR levels are equal and symmetric in the upper and lower extremities. Cranial nerves II through XII are grossly intact. Proprioception is intact. No peripheral adenopathy or edema is identified. No motor or sensory levels are noted. Crude visual fields are within normal range.  RADIOLOGY RESULTS: CT scan for quality assurance was performed today  PLAN: Present time patient is doing well with no smoking side effects status post I-125 interstitial implant. We performed a CT scan for quality assurance  and will do analysis on that which we'll repeat be reported separately. I'm otherwise please was overall progress. I've asked to see him back in 4 months for follow-up and will obtain a PSA prior to that visit. Patient knows to call sooner with any concerns.  I would like to take this opportunity to thank you for allowing me to participate in the care of your patient.Armstead Peaks., MD

## 2017-03-01 NOTE — Addendum Note (Signed)
Encounter addended by: Noreene Filbert, MD on: 03/01/2017 11:57 AM<BR>    Actions taken: LOS modified, Follow-up modified

## 2017-03-04 ENCOUNTER — Ambulatory Visit: Payer: Medicare Other | Admitting: Urology

## 2017-03-04 ENCOUNTER — Telehealth: Payer: Self-pay | Admitting: Urology

## 2017-03-04 NOTE — Telephone Encounter (Signed)
Could you please call him and reschedule for an about 6 months if he is doing well?  Hollice Espy, MD

## 2017-03-04 NOTE — Telephone Encounter (Signed)
Pt was a no show for appt this morning.  1 month post op voiding.  Just F.Y.I.

## 2017-03-08 DIAGNOSIS — Z510 Encounter for antineoplastic radiation therapy: Secondary | ICD-10-CM | POA: Diagnosis not present

## 2017-03-08 DIAGNOSIS — C61 Malignant neoplasm of prostate: Secondary | ICD-10-CM | POA: Diagnosis not present

## 2017-06-22 ENCOUNTER — Inpatient Hospital Stay: Payer: Medicare Other | Attending: Radiation Oncology

## 2017-06-22 DIAGNOSIS — C61 Malignant neoplasm of prostate: Secondary | ICD-10-CM | POA: Insufficient documentation

## 2017-06-22 LAB — PSA: Prostatic Specific Antigen: 3.24 ng/mL (ref 0.00–4.00)

## 2017-06-29 ENCOUNTER — Ambulatory Visit
Admission: RE | Admit: 2017-06-29 | Discharge: 2017-06-29 | Disposition: A | Discharge status: Home / Self Care | Payer: Medicare Other | Source: Ambulatory Visit | Attending: Radiation Oncology | Admitting: Radiation Oncology

## 2017-06-29 ENCOUNTER — Encounter: Payer: Self-pay | Admitting: Radiation Oncology

## 2017-06-29 ENCOUNTER — Other Ambulatory Visit: Payer: Self-pay | Admitting: *Deleted

## 2017-06-29 VITALS — BP 163/75 | HR 82 | Temp 96.2°F | Resp 18 | Wt 203.7 lb

## 2017-06-29 DIAGNOSIS — C61 Malignant neoplasm of prostate: Secondary | ICD-10-CM | POA: Diagnosis not present

## 2017-06-29 NOTE — Progress Notes (Signed)
Radiation Oncology Follow up Note  Name: Jared Herrera   Date:   06/29/2017 MRN:  [id number] DOB: 1951/05/11    This 66 y.o. male presents to the clinic today for 5 month follow-up status post I-125 interstitial implant for Gleason 6 adenocarcinoma the prostate.  REFERRING PROVIDER: Kathrine Haddock, NP  HPI: Patient is a 66 year old male now out 5 months having completed radiation therapy to his prostate for a Gleason 6 (3+3) adenocarcinoma the prostate.Marland Kitchen His initial PSA had climbed to 6.2. He is seen today in routine follow-up is doing well specifically denies diarrhea dysuria or any other GI/GU complaints. Most recent PSA performed this month was 3.2.  COMPLICATIONS OF TREATMENT: none  FOLLOW UP COMPLIANCE: keeps appointments   PHYSICAL EXAM:  BP (!) 163/75   Pulse 82   Temp (!) 96.2 F (35.7 C)   Resp 18   Wt 203 lb 11.3 oz (92.4 kg)   BMI 30.08 kg/m  On rectal exam rectal sphincter tone is good. Prostate is smooth contracted without evidence of nodularity or mass. Sulcus is preserved bilaterally. No discrete nodularity is identified. No other rectal abnormalities are noted. Well-developed well-nourished patient in NAD. HEENT reveals PERLA, EOMI, discs not visualized.  Oral cavity is clear. No oral mucosal lesions are identified. Neck is clear without evidence of cervical or supraclavicular adenopathy. Lungs are clear to A&P. Cardiac examination is essentially unremarkable with regular rate and rhythm without murmur rub or thrill. Abdomen is benign with no organomegaly or masses noted. Motor sensory and DTR levels are equal and symmetric in the upper and lower extremities. Cranial nerves II through XII are grossly intact. Proprioception is intact. No peripheral adenopathy or edema is identified. No motor or sensory levels are noted. Crude visual fields are within normal range.  RADIOLOGY RESULTS: No current films for review  PLAN: Present time patient is doing well I would like  to see his PSA fall somewhat lower although over the first year after I-125 interstitial implant her to be PSA bumps and we will continue to track this with a follow-up PSA in 6 months. Follow-up on was made at that time also. Patient knows to call sooner with any concerns.  I would like to take this opportunity to thank you for allowing me to participate in the care of your patient.Armstead Peaks., MD

## 2017-07-18 ENCOUNTER — Other Ambulatory Visit: Payer: Self-pay | Admitting: Unknown Physician Specialty

## 2017-07-18 MED ORDER — LISINOPRIL-HYDROCHLOROTHIAZIDE 20-25 MG PO TABS: 1.0000 | ORAL_TABLET | Freq: Every day | ORAL | 1 refills | Status: DC

## 2017-07-18 NOTE — Telephone Encounter (Signed)
Routing to provider. Patient last seen 01/18/17 and does not have a f/up scheduled.

## 2017-07-18 NOTE — Telephone Encounter (Signed)
Called and let patient know that his prescription has been sent in to the pharmacy for him. Also scheduled the patient's 6 month f/up while on the phone.

## 2017-07-18 NOTE — Telephone Encounter (Signed)
Patient would like a for Lisinopril.  Patient states that his bottle says that he has no refills left.  Patient uses [facility] as his pharmacy.

## 2017-07-22 ENCOUNTER — Ambulatory Visit: Payer: Medicare Other | Admitting: Unknown Physician Specialty

## 2017-09-14 ENCOUNTER — Encounter: Payer: Self-pay | Admitting: Urology

## 2017-09-14 ENCOUNTER — Ambulatory Visit (INDEPENDENT_AMBULATORY_CARE_PROVIDER_SITE_OTHER): Payer: Medicare Other | Admitting: Urology

## 2017-09-14 VITALS — BP 120/79 | HR 87 | Ht 70.0 in | Wt 204.2 lb

## 2017-09-14 DIAGNOSIS — C61 Malignant neoplasm of prostate: Secondary | ICD-10-CM | POA: Diagnosis not present

## 2017-09-14 NOTE — Progress Notes (Signed)
9:56 AM  09/14/17  Jared Herrera 1951/06/11 [id number]  Referring provider: Kathrine Haddock, NP [address], [place] [address]  Chief Complaint  Patient presents with  . Prostate Cancer    HPI: 66 yo AAM on active surveillance of prostate cancer dx 10/2015 initially dx with low risk prostate cancer upstaged to intermediate risk 2018 now s/p brachytherapy 01/2017.   His PSA was noted to rise to 6.5 from 4.6 only 9 months ago. Repeat PSA 6.2.   Prostate biopsy showed Gleason 3+3 in 2/12 cores up to 7% at right lateral base and left mid prostate on 1/31/7.  TRUS volume 43 g.  Rectal exam unremarkable.    He underwent confirmatory prostate biopsy on 11/29/2016 revealing shows upgrading of his prostate cancer with 1 core of 3+4, 19% at the right lateral mid prostate (40% of the tumor comprised of Gleason grade 4). He also has 2 additional low volume cores of Gleason 3+3 in the left lateral mid and apex.  Note, he did have an ultrasound abnormality at this exact site.  TRUS volume 54 g.  At that time, his PSA had risen to 7.1.  Most recent PSA 3.24 as of 06/2017.  He is also being followed by Dr. Noreene Filbert.  No urinary complaints today, voiding well.  No dysuria, gross hematuria, frequency or urgency.  Pleased.   No ED.    PMH: Past Medical History:  Diagnosis Date  . Arthritis   . Cancer [facility])    Prostate  . Cataract    bilateral  . GERD (gastroesophageal reflux disease)   . Hypertension    controlled on meds  . Sinus drainage     Surgical History: Past Surgical History:  Procedure Laterality Date  . COLONOSCOPY WITH PROPOFOL N/A 04/05/2016   Procedure: COLONOSCOPY WITH PROPOFOL;  Surgeon: Lucilla Lame, MD;  Location: [facility];  Service: Endoscopy;  Laterality: N/A;  . PROSTATE BIOPSY  March 2017   showed some cells, but they are slow growing  . RADIOACTIVE SEED IMPLANT N/A 02/01/2017   Procedure: RADIOACTIVE SEED IMPLANT/BRACHYTHERAPY IMPLANT;  Surgeon:  Hollice Espy, MD;  Location: [facility];  Service: Urology;  Laterality: N/A;  . TONSILLECTOMY AND ADENOIDECTOMY    . WISDOM TOOTH EXTRACTION      Home Medications:  Allergies as of 09/14/2017   No Known Allergies     Medication List        Accurate as of 09/14/17  9:56 AM. Always use your most recent med list.          aspirin 81 MG tablet Take 81 mg by mouth daily. Am, Reported on 11/11/2015   lisinopril-hydrochlorothiazide 20-25 MG tablet Commonly known as:  PRINZIDE,ZESTORETIC Take 1 tablet by mouth daily.   loratadine 10 MG tablet Commonly known as:  CLARITIN Take 10 mg by mouth daily. am   ranitidine 150 MG tablet Commonly known as:  ZANTAC Take 150 mg by mouth as needed.   tamsulosin 0.4 MG Caps capsule Commonly known as:  FLOMAX Take 1 capsule (0.4 mg total) by mouth daily.   ULTRA MAN PO Take 2 tablets by mouth daily. Am,2 tablets daily       Allergies: No Known Allergies  Family History: Family History  Problem Relation Age of Onset  . Heart disease Brother   . Hypertension Brother   . Hypertension Mother   . Kidney disease Mother   . Cancer Sister        thyroid  .  Lupus Daughter   . Kidney disease Daughter   . COPD Neg Hx   . Diabetes Neg Hx   . Stroke Neg Hx     Social History:  reports that he quit smoking about 38 years ago. His smoking use included cigarettes. He has a 4.00 pack-year smoking history. he has never used smokeless tobacco. He reports that he drinks alcohol. He reports that he does not use drugs.  Psychologic Depression?: No Anxiety?: No   Physical Exam: BP 120/79 (BP Location: Right Arm, Patient Position: Sitting, Cuff Size: Large)   Pulse 87   Ht 5\' 10"  (1.778 m)   Wt 204 lb 3.2 oz (92.6 kg)   BMI 29.30 kg/m   Constitutional:  Alert and oriented, No acute distress.  Presents today with wife, daughter, and granddaughter.   HEENT: [place] AT, moist mucus membranes.  Trachea midline, no masses. Cardiovascular: No  clubbing, cyanosis, or edema Abd: soft NT, ND. Respiratory: Normal respiratory effort, no increased work of breathing..  Skin: No rashes, bruises or suspicious lesions. Neurologic: Grossly intact, no focal deficits, moving all 4 extremities. Psychiatric: Normal mood and affect.   Pertinent Imaging:  n/a  Assessment & Plan:    1. Prostate cancer ([place]) T2a intermediate risk prostate cancer s/p brachytherapy 01/2017 PSA trending down, reasssuring No urinary or ED complaints  F/u with Chrystal in 4 months as scheduled then me in 10 months  Return in about 10 months (around 07/15/2018), or PSA.   Hollice Espy, MD  [facility] [address] [place], [place], [place] [address] [phone]

## 2017-10-18 DIAGNOSIS — Z23 Encounter for immunization: Secondary | ICD-10-CM | POA: Diagnosis not present

## 2017-12-16 ENCOUNTER — Other Ambulatory Visit: Payer: Self-pay | Admitting: *Deleted

## 2017-12-22 ENCOUNTER — Inpatient Hospital Stay: Payer: Medicare Other | Attending: Radiation Oncology

## 2017-12-22 DIAGNOSIS — C61 Malignant neoplasm of prostate: Secondary | ICD-10-CM | POA: Insufficient documentation

## 2017-12-22 LAB — PSA: Prostatic Specific Antigen: 1.76 ng/mL (ref 0.00–4.00)

## 2017-12-29 ENCOUNTER — Encounter: Payer: Self-pay | Admitting: Radiation Oncology

## 2017-12-29 ENCOUNTER — Other Ambulatory Visit: Payer: Self-pay

## 2017-12-29 ENCOUNTER — Other Ambulatory Visit: Payer: Self-pay | Admitting: *Deleted

## 2017-12-29 ENCOUNTER — Ambulatory Visit
Admission: RE | Admit: 2017-12-29 | Discharge: 2017-12-29 | Disposition: A | Discharge status: Home / Self Care | Payer: Medicare Other | Source: Ambulatory Visit | Attending: Radiation Oncology | Admitting: Radiation Oncology

## 2017-12-29 VITALS — BP 141/91 | HR 103 | Temp 96.7°F | Resp 18 | Wt 203.4 lb

## 2017-12-29 DIAGNOSIS — Z923 Personal history of irradiation: Secondary | ICD-10-CM | POA: Insufficient documentation

## 2017-12-29 DIAGNOSIS — C61 Malignant neoplasm of prostate: Secondary | ICD-10-CM

## 2017-12-29 NOTE — Progress Notes (Signed)
Radiation Oncology Follow up Note  Name: Jared Herrera   Date:   12/29/2017 MRN:  [id number] DOB: 03/31/1951    This 67 y.o. male presents to the clinic today for 1 year follow-up status post I-125 interstitial implant for Gleason 6 adenocarcinoma the prostate.  REFERRING PROVIDER: Kathrine Haddock, NP  HPI: patient is a 67 year old male now out over 1 year having completed I-125 interstitial implant for Gleason 6 (3+3) adenocarcinoma the prostate. Initial PSA was 6.2.Marland Kitchen6 months ago his PSA was 3.2 was dropped to 1.7 this week. He is doing well. He specifically denies any increased lower urinary tract symptoms diarrhea or fatigue.  COMPLICATIONS OF TREATMENT: none  FOLLOW UP COMPLIANCE: keeps appointments   PHYSICAL EXAM:  BP (!) 141/91   Pulse (!) 103   Temp (!) 96.7 F (35.9 C)   Resp 18   Wt 203 lb 6 oz (92.3 kg)   BMI 29.18 kg/m  On rectal exam rectal sphincter tone is good. Prostate is smooth contracted without evidence of nodularity or mass. Sulcus is preserved bilaterally. No discrete nodularity is identified. No other rectal abnormalities are noted. Well-developed well-nourished patient in NAD. HEENT reveals PERLA, EOMI, discs not visualized.  Oral cavity is clear. No oral mucosal lesions are identified. Neck is clear without evidence of cervical or supraclavicular adenopathy. Lungs are clear to A&P. Cardiac examination is essentially unremarkable with regular rate and rhythm without murmur rub or thrill. Abdomen is benign with no organomegaly or masses noted. Motor sensory and DTR levels are equal and symmetric in the upper and lower extremities. Cranial nerves II through XII are grossly intact. Proprioception is intact. No peripheral adenopathy or edema is identified. No motor or sensory levels are noted. Crude visual fields are within normal range.  RADIOLOGY RESULTS: no current films for review  PLAN: present time patient is doing well. PSA is coming down nicely although  slightly elevated not of concern. We'll see her back in 6 months and repeat his PSA. Patient is to call sooner with any concerns.  I would like to take this opportunity to thank you for allowing me to participate in the care of your patient.Noreene Filbert, MD

## 2018-01-20 ENCOUNTER — Telehealth: Payer: Self-pay | Admitting: Unknown Physician Specialty

## 2018-01-20 ENCOUNTER — Ambulatory Visit (INDEPENDENT_AMBULATORY_CARE_PROVIDER_SITE_OTHER): Payer: Medicare Other

## 2018-01-20 ENCOUNTER — Telehealth: Payer: Self-pay

## 2018-01-20 VITALS — BP 118/62 | HR 78 | Temp 98.9°F | Resp 16 | Ht 68.5 in | Wt 203.4 lb

## 2018-01-20 DIAGNOSIS — Z1159 Encounter for screening for other viral diseases: Secondary | ICD-10-CM

## 2018-01-20 DIAGNOSIS — Z0000 Encounter for general adult medical examination without abnormal findings: Secondary | ICD-10-CM

## 2018-01-20 MED ORDER — LISINOPRIL-HYDROCHLOROTHIAZIDE 20-25 MG PO TABS: 1.0000 | ORAL_TABLET | Freq: Every day | ORAL | 1 refills | Status: DC

## 2018-01-20 NOTE — Patient Instructions (Addendum)
[name] , Thank you for taking time to come for your Medicare Wellness Visit. I appreciate your ongoing commitment to your health goals. Please review the following plan we discussed and let me know if I can assist you in the future.   Screening recommendations/referrals: Colonoscopy: completed 04/05/2016 Recommended yearly ophthalmology/optometry visit for glaucoma screening and checkup Recommended yearly dental visit for hygiene and checkup  Vaccinations: Influenza vaccine: due 06/2018 Pneumococcal vaccine: up to date Tdap vaccine: due, check with your insurance company for coverage information  Shingles vaccine: shingrix eligible, check with your insurance company for coverage   Advanced directives: Advance directive discussed with you today. I have provided a copy for you to complete at home and have notarized. Once this is complete please bring a copy in to our office so we can scan it into your chart.  Conditions/risks identified: Recommend continue drinking at least 6-8 glasses of water a day   Next appointment: Follow up with Regino Schultze in the next 2-3 months. Follow up in one year for your annual wellness exam.   Preventive Care 65 Years and Older, Male Preventive care refers to lifestyle choices and visits with your health care provider that can promote health and wellness. What does preventive care include?  A yearly physical exam. This is also called an annual well check.  Dental exams once or twice a year.  Routine eye exams. Ask your health care provider how often you should have your eyes checked.  Personal lifestyle choices, including:  Daily care of your teeth and gums.  Regular physical activity.  Eating a healthy diet.  Avoiding tobacco and drug use.  Limiting alcohol use.  Practicing safe sex.  Taking low doses of aspirin every day.  Taking vitamin and mineral supplements as recommended by your health care provider. What happens during an  annual well check? The services and screenings done by your health care provider during your annual well check will depend on your age, overall health, lifestyle risk factors, and family history of disease. Counseling  Your health care provider may ask you questions about your:  Alcohol use.  Tobacco use.  Drug use.  Emotional well-being.  Home and relationship well-being.  Sexual activity.  Eating habits.  History of falls.  Memory and ability to understand (cognition).  Work and work Statistician. Screening  You may have the following tests or measurements:  Height, weight, and BMI.  Blood pressure.  Lipid and cholesterol levels. These may be checked every 5 years, or more frequently if you are over 76 years old.  Skin check.  Lung cancer screening. You may have this screening every year starting at age 48 if you have a 30-pack-year history of smoking and currently smoke or have quit within the past 15 years.  Fecal occult blood test (FOBT) of the stool. You may have this test every year starting at age 63.  Flexible sigmoidoscopy or colonoscopy. You may have a sigmoidoscopy every 5 years or a colonoscopy every 10 years starting at age 50.  Prostate cancer screening. Recommendations will vary depending on your family history and other risks.  Hepatitis C blood test.  Hepatitis B blood test.  Sexually transmitted disease (STD) testing.  Diabetes screening. This is done by checking your blood sugar (glucose) after you have not eaten for a while (fasting). You may have this done every 1-3 years.  Abdominal aortic aneurysm (AAA) screening. You may need this if you are a current or former smoker.  Osteoporosis.  You may be screened starting at age 54 if you are at high risk. Talk with your health care provider about your test results, treatment options, and if necessary, the need for more tests. Vaccines  Your health care provider may recommend certain vaccines,  such as:  Influenza vaccine. This is recommended every year.  Tetanus, diphtheria, and acellular pertussis (Tdap, Td) vaccine. You may need a Td booster every 10 years.  Zoster vaccine. You may need this after age 38.  Pneumococcal 13-valent conjugate (PCV13) vaccine. One dose is recommended after age 43.  Pneumococcal polysaccharide (PPSV23) vaccine. One dose is recommended after age 68. Talk to your health care provider about which screenings and vaccines you need and how often you need them. This information is not intended to replace advice given to you by your health care provider. Make sure you discuss any questions you have with your health care provider. Document Released: 10/24/2015 Document Revised: 06/16/2016 Document Reviewed: 07/29/2015 Elsevier Interactive Patient Education  2017 [place] Prevention in the Home Falls can cause injuries. They can happen to people of all ages. There are many things you can do to make your home safe and to help prevent falls. What can I do on the outside of my home?  Regularly fix the edges of walkways and driveways and fix any cracks.  Remove anything that might make you trip as you walk through a door, such as a raised step or threshold.  Trim any bushes or trees on the path to your home.  Use bright outdoor lighting.  Clear any walking paths of anything that might make someone trip, such as rocks or tools.  Regularly check to see if handrails are loose or broken. Make sure that both sides of any steps have handrails.  Any raised decks and porches should have guardrails on the edges.  Have any leaves, snow, or ice cleared regularly.  Use sand or salt on walking paths during winter.  Clean up any spills in your garage right away. This includes oil or grease spills. What can I do in the bathroom?  Use night lights.  Install grab bars by the toilet and in the tub and shower. Do not use towel bars as grab bars.  Use  non-skid mats or decals in the tub or shower.  If you need to sit down in the shower, use a plastic, non-slip stool.  Keep the floor dry. Clean up any water that spills on the floor as soon as it happens.  Remove soap buildup in the tub or shower regularly.  Attach bath mats securely with double-sided non-slip rug tape.  Do not have throw rugs and other things on the floor that can make you trip. What can I do in the bedroom?  Use night lights.  Make sure that you have a light by your bed that is easy to reach.  Do not use any sheets or blankets that are too big for your bed. They should not hang down onto the floor.  Have a firm chair that has side arms. You can use this for support while you get dressed.  Do not have throw rugs and other things on the floor that can make you trip. What can I do in the kitchen?  Clean up any spills right away.  Avoid walking on wet floors.  Keep items that you use a lot in easy-to-reach places.  If you need to reach something above you, use a strong step stool  that has a grab bar.  Keep electrical cords out of the way.  Do not use floor polish or wax that makes floors slippery. If you must use wax, use non-skid floor wax.  Do not have throw rugs and other things on the floor that can make you trip. What can I do with my stairs?  Do not leave any items on the stairs.  Make sure that there are handrails on both sides of the stairs and use them. Fix handrails that are broken or loose. Make sure that handrails are as long as the stairways.  Check any carpeting to make sure that it is firmly attached to the stairs. Fix any carpet that is loose or worn.  Avoid having throw rugs at the top or bottom of the stairs. If you do have throw rugs, attach them to the floor with carpet tape.  Make sure that you have a light switch at the top of the stairs and the bottom of the stairs. If you do not have them, ask someone to add them for you. What  else can I do to help prevent falls?  Wear shoes that:  Do not have high heels.  Have rubber bottoms.  Are comfortable and fit you well.  Are closed at the toe. Do not wear sandals.  If you use a stepladder:  Make sure that it is fully opened. Do not climb a closed stepladder.  Make sure that both sides of the stepladder are locked into place.  Ask someone to hold it for you, if possible.  Clearly mark and make sure that you can see:  Any grab bars or handrails.  First and last steps.  Where the edge of each step is.  Use tools that help you move around (mobility aids) if they are needed. These include:  Canes.  Walkers.  Scooters.  Crutches.  Turn on the lights when you go into a dark area. Replace any light bulbs as soon as they burn out.  Set up your furniture so you have a clear path. Avoid moving your furniture around.  If any of your floors are uneven, fix them.  If there are any pets around you, be aware of where they are.  Review your medicines with your doctor. Some medicines can make you feel dizzy. This can increase your chance of falling. Ask your doctor what other things that you can do to help prevent falls. This information is not intended to replace advice given to you by your health care provider. Make sure you discuss any questions you have with your health care provider. Document Released: 07/24/2009 Document Revised: 03/04/2016 Document Reviewed: 11/01/2014 Elsevier Interactive Patient Education  2017 [employer].

## 2018-01-20 NOTE — Progress Notes (Signed)
Subjective:   JD MCCASTER is a 67 y.o. male who presents for Medicare Annual/Subsequent preventive examination.  Review of Systems:   Cardiac Risk Factors include: hypertension;male gender;advanced age (>30men, >58 women);obesity (BMI >30kg/m2)     Objective:    Vitals: BP 118/62 (BP Location: Left Arm, Patient Position: Sitting)   Pulse 78   Temp 98.9 F (37.2 C) (Temporal)   Resp 16   Ht 5' 8.5" (1.74 m)   Wt 203 lb 6.4 oz (92.3 kg)   BMI 30.48 kg/m   Body mass index is 30.48 kg/m.  Advanced Directives 01/20/2018 12/29/2017 06/29/2017 03/01/2017 02/01/2017 01/18/2017 01/18/2017  Does Patient Have a Medical Advance Directive? No No No No No No No  Would patient like information on creating a medical advance directive? Yes (MAU/Ambulatory/Procedural Areas - Information given) No - Patient declined No - Patient declined No - Patient declined - Yes (ED - Information included in AVS) No - Patient declined    Tobacco Social History   Tobacco Use  Smoking Status Former Smoker  . Packs/day: 0.50  . Years: 8.00  . Pack years: 4.00  . Types: Cigarettes  . Last attempt to quit: 10/11/1978  . Years since quitting: 39.3  Smokeless Tobacco Never Used     Counseling given: Not Answered   Clinical Intake:  Pre-visit preparation completed: Yes  Pain : No/denies pain     Nutritional Status: BMI > 30  Obese Nutritional Risks: None Diabetes: No  How often do you need to have someone help you when you read instructions, pamphlets, or other written materials from your doctor or pharmacy?: 1 - Never What is the last grade level you completed in school?: 11th grade  Interpreter Needed?: No  Information entered by :: Kester Stimpson,LPN   Past Medical History:  Diagnosis Date  . Arthritis   . Cancer [facility])    Prostate  . Cataract    bilateral  . GERD (gastroesophageal reflux disease)   . Hypertension    controlled on meds  . Sinus drainage    Past Surgical History:    Procedure Laterality Date  . COLONOSCOPY WITH PROPOFOL N/A 04/05/2016   Procedure: COLONOSCOPY WITH PROPOFOL;  Surgeon: Lucilla Lame, MD;  Location: [place];  Service: Endoscopy;  Laterality: N/A;  . PROSTATE BIOPSY  March 2017   showed some cells, but they are slow growing  . RADIOACTIVE SEED IMPLANT N/A 02/01/2017   Procedure: RADIOACTIVE SEED IMPLANT/BRACHYTHERAPY IMPLANT;  Surgeon: Hollice Espy, MD;  Location: [facility];  Service: Urology;  Laterality: N/A;  . TONSILLECTOMY AND ADENOIDECTOMY    . WISDOM TOOTH EXTRACTION     Family History  Problem Relation Age of Onset  . Heart disease Brother   . Hypertension Brother   . Hypertension Mother   . Kidney disease Mother   . Cancer Sister        thyroid  . Lupus Daughter   . Kidney disease Daughter   . COPD Neg Hx   . Diabetes Neg Hx   . Stroke Neg Hx    Social History   Socioeconomic History  . Marital status: Legally Separated    Spouse name: Not on file  . Number of children: Not on file  . Years of education: Not on file  . Highest education level: Not on file  Occupational History  . Not on file  Social Needs  . Financial resource strain: Not hard at all  . Food insecurity:    Worry:  Never true    Inability: Never true  . Transportation needs:    Medical: No    Non-medical: No  Tobacco Use  . Smoking status: Former Smoker    Packs/day: 0.50    Years: 8.00    Pack years: 4.00    Types: Cigarettes    Last attempt to quit: 10/11/1978    Years since quitting: 39.3  . Smokeless tobacco: Never Used  Substance and Sexual Activity  . Alcohol use: Yes    Alcohol/week: 0.0 oz    Comment: occasional glass of wine  . Drug use: No  . Sexual activity: Never  Lifestyle  . Physical activity:    Days per week: 0 days    Minutes per session: 0 min  . Stress: Not at all  Relationships  . Social connections:    Talks on phone: More than three times a week    Gets together: More than three times a week     Attends religious service: More than 4 times per year    Active member of club or organization: No    Attends meetings of clubs or organizations: Never    Relationship status: Separated  Other Topics Concern  . Not on file  Social History Narrative  . Not on file    Outpatient Encounter Medications as of 01/20/2018  Medication Sig  . aspirin 81 MG tablet Take 81 mg by mouth daily. Am, Reported on 11/11/2015  . lisinopril-hydrochlorothiazide (PRINZIDE,ZESTORETIC) 20-25 MG tablet Take 1 tablet by mouth daily.  Marland Kitchen loratadine (CLARITIN) 10 MG tablet Take 10 mg by mouth daily. am  . ranitidine (ZANTAC) 150 MG tablet Take 150 mg by mouth as needed.   Marland Kitchen Specialty Vitamins Products (ULTRA MAN PO) Take 2 tablets by mouth daily. Am,2 tablets daily  . tamsulosin (FLOMAX) 0.4 MG CAPS capsule Take 1 capsule (0.4 mg total) by mouth daily.   No facility-administered encounter medications on file as of 01/20/2018.     Activities of Daily Living In your present state of health, do you have any difficulty performing the following activities: 01/20/2018  Hearing? N  Vision? N  Difficulty concentrating or making decisions? N  Walking or climbing stairs? N  Dressing or bathing? N  Doing errands, shopping? N  Preparing Food and eating ? N  Using the Toilet? N  In the past six months, have you accidently leaked urine? N  Do you have problems with loss of bowel control? N  Managing your Medications? N  Managing your Finances? N  Housekeeping or managing your Housekeeping? N  Some recent data might be hidden    Patient Care Team: Kathrine Haddock, NP as PCP - General (Nurse Practitioner) Hollice Espy, MD as Consulting Physician (Urology)   Assessment:   This is a routine wellness examination for [place].  Exercise Activities and Dietary recommendations Current Exercise Habits: The patient does not participate in regular exercise at present, Exercise limited by: None identified  Goals    . DIET -  INCREASE WATER INTAKE     Recommend continue drinking at least 6-8 glasses of water a day        Fall Risk Fall Risk  01/20/2018 06/29/2017 03/01/2017 01/18/2017 01/16/2016  Falls in the past year? No No No No No   Is the patient's home free of loose throw rugs in walkways, pet beds, electrical cords, etc?   yes      Grab bars in the bathroom? no      Handrails  on the stairs?   no stairs      Adequate lighting?   yes  Timed Get Up and Go Performed: Completed in 8 seconds with no use of assistive devices, steady gait. No intervention needed at this time.   Depression Screen PHQ 2/9 Scores 01/20/2018 06/29/2017 03/01/2017 01/18/2017  PHQ - 2 Score 1 0 0 0  PHQ- 9 Score 4 - - 1    Cognitive Function     6CIT Screen 01/20/2018  What Year? 0 points  What month? 0 points  What time? 0 points  Count back from 20 0 points  Months in reverse 0 points  Repeat phrase 2 points  Total Score 2    Immunization History  Administered Date(s) Administered  . Influenza, High Dose Seasonal PF 08/20/2016  . Influenza,inj,Quad PF,6+ Mos 10/10/2015  . Pneumococcal Conjugate-13 01/15/2015  . Pneumococcal Polysaccharide-23 01/16/2016  . Td 06/20/2007  . Zoster 01/11/2014    Qualifies for Shingles Vaccine? Yes, discussed shingrix vaccine.   Screening Tests Health Maintenance  Topic Date Due  . Hepatitis C Screening  1951-03-11  . TETANUS/TDAP  06/19/2017  . INFLUENZA VACCINE  05/11/2018  . COLONOSCOPY  04/05/2021  . PNA vac Low Risk Adult  Completed   Cancer Screenings: Lung: Low Dose CT Chest recommended if Age 30-80 years, 30 pack-year currently smoking OR have quit w/in 15years. Patient does not qualify. Colorectal: completed 04/05/2016  Additional Screenings:  Hepatitis C Screening:will order for future lab draw      Plan:    I have personally reviewed and addressed the Medicare Annual Wellness questionnaire and have noted the following in the patient's chart:  A. Medical and social  history B. Use of alcohol, tobacco or illicit drugs  C. Current medications and supplements D. Functional ability and status E.  Nutritional status F.  Physical activity G. Advance directives H. List of other physicians I.  Hospitalizations, surgeries, and ER visits in previous 12 months J.  [place] such as hearing and vision if needed, cognitive and depression L. Referrals and appointments   In addition, I have reviewed and discussed with patient certain preventive protocols, quality metrics, and best practice recommendations. A written personalized care plan for preventive services as well as general preventive health recommendations were provided to patient.   Signed,  Tyler Aas, LPN Nurse Health Advisor   Nurse Notes:none

## 2018-01-20 NOTE — Telephone Encounter (Signed)
error 

## 2018-01-20 NOTE — Telephone Encounter (Signed)
Rx sent to his pharmacy

## 2018-01-20 NOTE — Telephone Encounter (Signed)
Pt would like a refill for lisinopril sent to [facility]. He has an appt next week with Malachy Mood, he would just like a 30 day supply because he only has 1 pill left.

## 2018-01-25 ENCOUNTER — Ambulatory Visit: Payer: Medicare Other | Admitting: Unknown Physician Specialty

## 2018-02-21 ENCOUNTER — Other Ambulatory Visit: Payer: Self-pay | Admitting: Unknown Physician Specialty

## 2018-02-21 NOTE — Telephone Encounter (Signed)
Copied from [facility] #[id number]. Topic: Quick Communication - See Telephone Encounter >> Feb 21, 2018  9:08 AM Conception Chancy, NT wrote: CRM for notification. See Telephone encounter for: 02/21/18.  Patient is calling and states he needs a refill on lisinopril-hydrochlorothiazide (PRINZIDE,ZESTORETIC) 20-25 MG tablet. Patient has a follow up appt for medicine on 03/01/18.  [place], [place] - [address] [place] [address] Phone: [phone] Fax: [phone]

## 2018-02-21 NOTE — Telephone Encounter (Signed)
Spoke with Mickel Baas at [facility] has  A refill  Of  Lisinopril hydrochlorothiazide at the pharmacy VM left on patients answering machine

## 2018-03-01 ENCOUNTER — Encounter: Payer: Self-pay | Admitting: Unknown Physician Specialty

## 2018-03-01 ENCOUNTER — Ambulatory Visit (INDEPENDENT_AMBULATORY_CARE_PROVIDER_SITE_OTHER): Payer: Medicare Other | Admitting: Unknown Physician Specialty

## 2018-03-01 VITALS — BP 132/88 | HR 94 | Temp 97.7°F | Ht 70.0 in | Wt 202.8 lb

## 2018-03-01 DIAGNOSIS — E785 Hyperlipidemia, unspecified: Secondary | ICD-10-CM | POA: Diagnosis not present

## 2018-03-01 DIAGNOSIS — I10 Essential (primary) hypertension: Secondary | ICD-10-CM | POA: Diagnosis not present

## 2018-03-01 DIAGNOSIS — R7301 Impaired fasting glucose: Secondary | ICD-10-CM

## 2018-03-01 DIAGNOSIS — Z7189 Other specified counseling: Secondary | ICD-10-CM

## 2018-03-01 LAB — BAYER DCA HB A1C WAIVED: HB A1C (BAYER DCA - WAIVED): 5.5 % (ref <7.0)

## 2018-03-01 MED ORDER — LISINOPRIL-HYDROCHLOROTHIAZIDE 20-25 MG PO TABS: 1.0000 | ORAL_TABLET | Freq: Every day | ORAL | 1 refills | Status: DC

## 2018-03-01 NOTE — Assessment & Plan Note (Addendum)
A voluntary discussion about advance care planning including the explanation and discussion of advance directives was extensively discussed  with the patient.  Explanation about the health care proxy and Living will was reviewed and packet with forms with explanation of how to fill them out was given.  During this discussion, the patient was able to identify a health care proxy as his daughter and plansto fill out the paperwork required.  Patient was offered a separate [facility] visit for further assistance with forms.  Given a "Isn't it time we talked" booklet to assist with conversation with family.  Time spent: 15 minutes  Individuals present: patient

## 2018-03-01 NOTE — Assessment & Plan Note (Signed)
Stable, continue present medications.  Encouraged to check home numbers

## 2018-03-01 NOTE — Assessment & Plan Note (Signed)
Borderline blood sugars.  Check Hgb A1C today

## 2018-03-01 NOTE — Assessment & Plan Note (Addendum)
Check lipid panel today.  Discussed ASCVD risk.  Pt elects not to take statin at this time but willing to consider at next visit.

## 2018-03-01 NOTE — Progress Notes (Signed)
BP 132/88   Pulse 94   Temp 97.7 F (36.5 C) (Oral)   Ht 5\' 10"  (1.778 m)   Wt 202 lb 12.8 oz (92 kg)   SpO2 97%   BMI 29.10 kg/m    Subjective:    Patient ID: Jared Herrera, male    DOB: [birth year], 67 y.o.   MRN: [id number]  HPI: Jared Herrera is a 67 y.o. male  Chief Complaint  Patient presents with  . Hypertension   Hypertension Using medications without difficulty Average home BPs Has a monitor and hasn't used it.     No problems or lightheadedness No chest pain with exertion or shortness of breath No Edema  The 10-year ASCVD risk score Mikey Bussing DC Jr., et al., 2013) is: 19.2%   Values used to calculate the score:     Age: 46 years     Sex: Male     Is Non-Hispanic African American: Yes     Diabetic: No     Tobacco smoker: No     Systolic Blood Pressure: 671 mmHg     Is BP treated: Yes     HDL Cholesterol: 39 mg/dL     Total Cholesterol: 169 mg/dL   Social History   Socioeconomic History  . Marital status: Legally Separated    Spouse name: Not on file  . Number of children: Not on file  . Years of education: Not on file  . Highest education level: Not on file  Occupational History  . Not on file  Social Needs  . Financial resource strain: Not hard at all  . Food insecurity:    Worry: Never true    Inability: Never true  . Transportation needs:    Medical: No    Non-medical: No  Tobacco Use  . Smoking status: Former Smoker    Packs/day: 0.50    Years: 8.00    Pack years: 4.00    Types: Cigarettes    Last attempt to quit: 10/11/1978    Years since quitting: 39.4  . Smokeless tobacco: Never Used  Substance and Sexual Activity  . Alcohol use: Yes    Alcohol/week: 0.0 oz    Comment: occasional glass of wine  . Drug use: No  . Sexual activity: Never  Lifestyle  . Physical activity:    Days per week: 0 days    Minutes per session: 0 min  . Stress: Not at all  Relationships  . Social connections:    Talks on phone: More than three  times a week    Gets together: More than three times a week    Attends religious service: More than 4 times per year    Active member of club or organization: No    Attends meetings of clubs or organizations: Never    Relationship status: Separated  . Intimate partner violence:    Fear of current or ex partner: No    Emotionally abused: No    Physically abused: No    Forced sexual activity: No  Other Topics Concern  . Not on file  Social History Narrative  . Not on file   Past Medical History:  Diagnosis Date  . Arthritis   . Cancer [facility])    Prostate  . Cataract    bilateral  . GERD (gastroesophageal reflux disease)   . Hypertension    controlled on meds  . Sinus drainage    Past Surgical History:  Procedure Laterality Date  . COLONOSCOPY WITH  PROPOFOL N/A 04/05/2016   Procedure: COLONOSCOPY WITH PROPOFOL;  Surgeon: Lucilla Lame, MD;  Location: [place];  Service: Endoscopy;  Laterality: N/A;  . PROSTATE BIOPSY  March 2017   showed some cells, but they are slow growing  . RADIOACTIVE SEED IMPLANT N/A 02/01/2017   Procedure: RADIOACTIVE SEED IMPLANT/BRACHYTHERAPY IMPLANT;  Surgeon: Hollice Espy, MD;  Location: [facility];  Service: Urology;  Laterality: N/A;  . TONSILLECTOMY AND ADENOIDECTOMY    . WISDOM TOOTH EXTRACTION       Relevant past medical, surgical, family and social history reviewed and updated as indicated. Interim medical history since our last visit reviewed. Allergies and medications reviewed and updated.  Review of Systems  Constitutional: Negative.   HENT: Negative.   Eyes: Negative.   Respiratory: Negative.   Cardiovascular: Negative.   Gastrointestinal: Negative.   Endocrine: Negative.   Genitourinary: Negative.   Skin: Negative.   Allergic/Immunologic: Negative.   Neurological: Negative.   Hematological: Negative.   Psychiatric/Behavioral: Negative.     Per HPI unless specifically indicated above     Objective:    BP 132/88    Pulse 94   Temp 97.7 F (36.5 C) (Oral)   Ht 5\' 10"  (1.778 m)   Wt 202 lb 12.8 oz (92 kg)   SpO2 97%   BMI 29.10 kg/m   Wt Readings from Last 3 Encounters:  03/01/18 202 lb 12.8 oz (92 kg)  01/20/18 203 lb 6.4 oz (92.3 kg)  12/29/17 203 lb 6 oz (92.3 kg)    Physical Exam  Constitutional: He is oriented to person, place, and time. He appears well-developed and well-nourished. No distress.  HENT:  Head: Normocephalic and atraumatic.  Eyes: Conjunctivae and lids are normal. Right eye exhibits no discharge. Left eye exhibits no discharge. No scleral icterus.  Neck: Normal range of motion. Neck supple. No JVD present. Carotid bruit is not present.  Cardiovascular: Normal rate, regular rhythm and normal heart sounds.  Pulmonary/Chest: Effort normal and breath sounds normal. No respiratory distress.  Abdominal: Normal appearance. There is no splenomegaly or hepatomegaly.  Musculoskeletal: Normal range of motion.  Neurological: He is alert and oriented to person, place, and time.  Skin: Skin is warm, dry and intact. No rash noted. No pallor.  Psychiatric: He has a normal mood and affect. His behavior is normal. Judgment and thought content normal.       Assessment & Plan:   Problem List Items Addressed This Visit      Unprioritized   Advanced care planning/counseling discussion    A voluntary discussion about advance care planning including the explanation and discussion of advance directives was extensively discussed  with the patient.  Explanation about the health care proxy and Living will was reviewed and packet with forms with explanation of how to fill them out was given.  During this discussion, the patient was able to identify a health care proxy as his daughter and plansto fill out the paperwork required.  Patient was offered a separate [place] visit for further assistance with forms.  Given a "Isn't it time we talked" booklet to assist with conversation with  family.  Time spent: 15 minutes  Individuals present: patient       Hyperlipemia - Primary    Check lipid panel today.  Discussed ASCVD risk.  Pt elects not to take statin at this time but willing to consider at next visit.       Relevant Medications   lisinopril-hydrochlorothiazide (PRINZIDE,ZESTORETIC) 20-25  MG tablet   Other Relevant Orders   Lipid Panel w/o Chol/HDL Ratio   Hypertension    Stable, continue present medications.  Encouraged to check home numbers       Relevant Medications   lisinopril-hydrochlorothiazide (PRINZIDE,ZESTORETIC) 20-25 MG tablet   Other Relevant Orders   Comprehensive metabolic panel   IFG (impaired fasting glucose)    Borderline blood sugars.  Check Hgb A1C today      Relevant Orders   Bayer DCA Hb A1c Waived       Follow up plan: Return in about 6 months (around 09/01/2018).

## 2018-03-02 ENCOUNTER — Encounter: Payer: Self-pay | Admitting: Unknown Physician Specialty

## 2018-03-02 LAB — LIPID PANEL W/O CHOL/HDL RATIO
CHOLESTEROL TOTAL: 156 mg/dL (ref 100–199)
HDL: 43 mg/dL (ref >39)
LDL CALC: 101 mg/dL — AB (ref 0–99)
TRIGLYCERIDES: 58 mg/dL (ref 0–149)
VLDL CHOLESTEROL CAL: 12 mg/dL (ref 5–40)

## 2018-03-02 LAB — COMPREHENSIVE METABOLIC PANEL
A/G RATIO: 1.3 (ref 1.2–2.2)
ALT: 23 IU/L (ref 0–44)
AST: 19 IU/L (ref 0–40)
Albumin: 4.1 g/dL (ref 3.6–4.8)
Alkaline Phosphatase: 85 IU/L (ref 39–117)
BILIRUBIN TOTAL: 0.6 mg/dL (ref 0.0–1.2)
BUN/Creatinine Ratio: 18 (ref 10–24)
BUN: 13 mg/dL (ref 8–27)
CALCIUM: 9.5 mg/dL (ref 8.6–10.2)
CO2: 23 mmol/L (ref 20–29)
Chloride: 99 mmol/L (ref 96–106)
Creatinine, Ser: 0.73 mg/dL — ABNORMAL LOW (ref 0.76–1.27)
GFR calc Af Amer: 110 mL/min/{1.73_m2} (ref >59)
GFR calc non Af Amer: 95 mL/min/{1.73_m2} (ref >59)
GLUCOSE: 104 mg/dL — AB (ref 65–99)
Globulin, Total: 3.1 g/dL (ref 1.5–4.5)
Potassium: 4.1 mmol/L (ref 3.5–5.2)
Sodium: 138 mmol/L (ref 134–144)
Total Protein: 7.2 g/dL (ref 6.0–8.5)

## 2018-06-30 ENCOUNTER — Inpatient Hospital Stay: Payer: Medicare Other | Attending: Radiation Oncology

## 2018-06-30 DIAGNOSIS — C61 Malignant neoplasm of prostate: Secondary | ICD-10-CM | POA: Insufficient documentation

## 2018-06-30 LAB — PSA: PROSTATIC SPECIFIC ANTIGEN: 0.82 ng/mL (ref 0.00–4.00)

## 2018-07-04 ENCOUNTER — Emergency Department
Admission: EM | Admit: 2018-07-04 | Discharge: 2018-07-04 | Disposition: A | Discharge status: Home / Self Care | Payer: Medicare Other | Attending: Emergency Medicine | Admitting: Emergency Medicine

## 2018-07-04 ENCOUNTER — Encounter: Payer: Self-pay | Admitting: Medical Oncology

## 2018-07-04 DIAGNOSIS — Z87891 Personal history of nicotine dependence: Secondary | ICD-10-CM | POA: Insufficient documentation

## 2018-07-04 DIAGNOSIS — R1110 Vomiting, unspecified: Secondary | ICD-10-CM | POA: Diagnosis not present

## 2018-07-04 DIAGNOSIS — I10 Essential (primary) hypertension: Secondary | ICD-10-CM | POA: Insufficient documentation

## 2018-07-04 DIAGNOSIS — R21 Rash and other nonspecific skin eruption: Secondary | ICD-10-CM | POA: Diagnosis not present

## 2018-07-04 DIAGNOSIS — Z7982 Long term (current) use of aspirin: Secondary | ICD-10-CM | POA: Insufficient documentation

## 2018-07-04 DIAGNOSIS — D120 Benign neoplasm of cecum: Secondary | ICD-10-CM | POA: Insufficient documentation

## 2018-07-04 DIAGNOSIS — Z79899 Other long term (current) drug therapy: Secondary | ICD-10-CM | POA: Insufficient documentation

## 2018-07-04 DIAGNOSIS — Z711 Person with feared health complaint in whom no diagnosis is made: Secondary | ICD-10-CM | POA: Diagnosis not present

## 2018-07-04 DIAGNOSIS — D124 Benign neoplasm of descending colon: Secondary | ICD-10-CM | POA: Diagnosis not present

## 2018-07-04 DIAGNOSIS — R112 Nausea with vomiting, unspecified: Secondary | ICD-10-CM | POA: Diagnosis present

## 2018-07-04 DIAGNOSIS — Z8546 Personal history of malignant neoplasm of prostate: Secondary | ICD-10-CM | POA: Insufficient documentation

## 2018-07-04 LAB — GLUCOSE, CAPILLARY: Glucose-Capillary: 153 mg/dL — ABNORMAL HIGH (ref 70–99)

## 2018-07-04 NOTE — ED Notes (Signed)
See triage note  States he developed some nausea and not feeling well after eating breakfast this am  States he did vomit times 1   Also states he noticed a slight rash to arms  No rash noted at present

## 2018-07-04 NOTE — ED Provider Notes (Signed)
[facility] Emergency Department Provider Note  ____________________________________________  Time seen: Approximately 10:35 AM  I have reviewed the triage vital signs and the nursing notes.   HISTORY  Chief Complaint Emesis    HPI Jared Herrera is a 67 y.o. male that presents to the emergency department for evaluation of one episode of vomiting this morning.  Patient states that he ate breakfast at a restaurant this morning and had a [place] omelette, orange juice and felt nauseous afterwards.  He then started to feel nervous because this is not normal for him so he brought himself to the ER.  On the drive he noticed a rash to bilateral arms.  When he got to the parking lot, he vomited once.  He states that he watched on the news last night a list of foods that people were dying from and thinks that he overreacted and was thinking about this story.  He has never had an allergic reaction before.  He is not allergic to eggs or orange juice.  He does have GERD and has not had orange juice in several weeks.  No history of diabetes.  He feels like himself now.  No throat tingling, shortness of breath, abdominal pain, diarrhea.  Past Medical History:  Diagnosis Date  . Arthritis   . Cancer [facility])    Prostate  . Cataract    bilateral  . GERD (gastroesophageal reflux disease)   . Hypertension    controlled on meds  . Sinus drainage     Patient Active Problem List   Diagnosis Date Noted  . Advanced care planning/counseling discussion 01/18/2017  . Hyperlipemia 08/20/2016  . Overweight 08/20/2016  . Special screening for malignant neoplasms, colon   . Benign neoplasm of cecum   . Benign neoplasm of descending colon   . Prostate cancer ([place]) 01/16/2016  . Hypertension 09/25/2015  . BPH (benign prostatic hypertrophy) with urinary retention 09/25/2015  . IFG (impaired fasting glucose) 09/25/2015    Past Surgical History:  Procedure Laterality Date  .  COLONOSCOPY WITH PROPOFOL N/A 04/05/2016   Procedure: COLONOSCOPY WITH PROPOFOL;  Surgeon: Lucilla Lame, MD;  Location: [place];  Service: Endoscopy;  Laterality: N/A;  . PROSTATE BIOPSY  March 2017   showed some cells, but they are slow growing  . RADIOACTIVE SEED IMPLANT N/A 02/01/2017   Procedure: RADIOACTIVE SEED IMPLANT/BRACHYTHERAPY IMPLANT;  Surgeon: Hollice Espy, MD;  Location: [facility];  Service: Urology;  Laterality: N/A;  . TONSILLECTOMY AND ADENOIDECTOMY    . WISDOM TOOTH EXTRACTION      Prior to Admission medications   Medication Sig Start Date End Date Taking? Authorizing Provider  aspirin 81 MG tablet Take 81 mg by mouth daily. Am, Reported on 11/11/2015    [provider]  lisinopril-hydrochlorothiazide (PRINZIDE,ZESTORETIC) 20-25 MG tablet Take 1 tablet by mouth daily. 03/01/18   Kathrine Haddock, NP  loratadine (CLARITIN) 10 MG tablet Take 10 mg by mouth daily. am    [provider]  ranitidine (ZANTAC) 150 MG tablet Take 150 mg by mouth as needed.     [provider]  Specialty Vitamins Products (ULTRA MAN PO) Take 2 tablets by mouth daily. Am,2 tablets daily    [provider]    Allergies Patient has no known allergies.  Family History  Problem Relation Age of Onset  . Heart disease Brother   . Hypertension Brother   . Hypertension Mother   . Kidney disease Mother   . Cancer Sister  thyroid  . Lupus Daughter   . Kidney disease Daughter   . COPD Neg Hx   . Diabetes Neg Hx   . Stroke Neg Hx     Social History Social History   Tobacco Use  . Smoking status: Former Smoker    Packs/day: 0.50    Years: 8.00    Pack years: 4.00    Types: Cigarettes    Last attempt to quit: 10/11/1978    Years since quitting: 39.7  . Smokeless tobacco: Never Used  Substance Use Topics  . Alcohol use: Yes    Alcohol/week: 0.0 standard drinks    Comment: occasional glass of wine  . Drug use: No     Review of Systems   Constitutional: No fever/chills ENT: No upper respiratory complaints. Cardiovascular: No chest pain. Respiratory: No cough. No SOB. Gastrointestinal: No abdominal pain.   Musculoskeletal: Negative for musculoskeletal pain. Skin: Negative for rash, abrasions, lacerations, ecchymosis.   ____________________________________________   PHYSICAL EXAM:  VITAL SIGNS: ED Triage Vitals  Enc Vitals Group     BP 07/04/18 0937 (!) 145/76     Pulse Rate 07/04/18 0937 (!) 112     Resp 07/04/18 0937 18     Temp 07/04/18 0937 98 F (36.7 C)     Temp Source 07/04/18 0937 Oral     SpO2 07/04/18 0937 99 %     Weight 07/04/18 0939 200 lb (90.7 kg)     Height 07/04/18 0939 5\' 10"  (1.778 m)     Head Circumference --      Peak Flow --      Pain Score 07/04/18 0939 0     Pain Loc --      Pain Edu? --      Excl. in [place]? --      Constitutional: Alert and oriented. Well appearing and in no acute distress. Eyes: Conjunctivae are normal. PERRL. EOMI. Head: Atraumatic. ENT:      Ears:      Nose: No congestion/rhinnorhea.      Mouth/Throat: Mucous membranes are moist.  Neck: No stridor.   Cardiovascular: Normal rate, regular rhythm.  Good peripheral circulation. Respiratory: Normal respiratory effort without tachypnea or retractions. Lungs CTAB. Good air entry to the bases with no decreased or absent breath sounds. Gastrointestinal: Bowel sounds 4 quadrants. Soft and nontender to palpation. No guarding or rigidity. No palpable masses. No distention. Musculoskeletal: Full range of motion to all extremities. No gross deformities appreciated. Neurologic:  Normal speech and language. No gross focal neurologic deficits are appreciated.  Skin:  Skin is warm, dry and intact. No rash noted. Psychiatric: Mood and affect are normal. Speech and behavior are normal. Patient exhibits appropriate insight and judgement.   ____________________________________________   LABS (all labs ordered are listed, but  only abnormal results are displayed)  Labs Reviewed  GLUCOSE, CAPILLARY - Abnormal; Notable for the following components:      Result Value   Glucose-Capillary 153 (*)    All other components within normal limits  CBG MONITORING, ED   ____________________________________________  EKG   ____________________________________________  RADIOLOGY  No results found.  ____________________________________________    PROCEDURES  Procedure(s) performed:    Procedures    Medications - No data to display   ____________________________________________   INITIAL IMPRESSION / ASSESSMENT AND PLAN / ED COURSE  Pertinent labs & imaging results that were available during my care of the patient were reviewed by me and considered in my medical decision making (see chart  for details).  Review of the [place] CSRS was performed in accordance of the [place] prior to dispensing any controlled drugs.     Patient presented to the emergency department for evaluation of nausea and one episode of vomiting after breakfast this morning.  Vital signs and exam are reassuring.  Symptoms are likely caused by stress after seeing the news about eggs and people getting sick last night.  Patient feels completely normal now.  Patient is to follow up with primary care as directed. Patient is given ED precautions to return to the ED for any worsening or new symptoms.     ____________________________________________  FINAL CLINICAL IMPRESSION(S) / ED DIAGNOSES  Final diagnoses:  Feared complaint without diagnosis      NEW MEDICATIONS STARTED DURING THIS VISIT:  ED Discharge Orders    None          This chart was dictated using voice recognition software/Dragon. Despite best efforts to proofread, errors can occur which can change the meaning. Any change was purely unintentional.    Laban Emperor, PA-C 07/04/18 1551    Lavonia Drafts, MD 07/07/18 1000

## 2018-07-04 NOTE — ED Triage Notes (Addendum)
Pt reports that he had breakfast this am and afterward had 2 episodes of vomiting. Pt reports that he feels fine now but just wants to be checked out. Pt denies pain. Denies nausea. Pt reports that this was just "odd" for him. No distress noted.

## 2018-07-07 ENCOUNTER — Other Ambulatory Visit: Payer: Self-pay

## 2018-07-07 ENCOUNTER — Encounter: Payer: Self-pay | Admitting: Radiation Oncology

## 2018-07-07 ENCOUNTER — Ambulatory Visit
Admission: RE | Admit: 2018-07-07 | Discharge: 2018-07-07 | Disposition: A | Discharge status: Home / Self Care | Payer: Medicare Other | Source: Ambulatory Visit | Attending: Radiation Oncology | Admitting: Radiation Oncology

## 2018-07-07 VITALS — BP 141/80 | HR 107 | Temp 96.6°F | Resp 18 | Wt 204.6 lb

## 2018-07-07 DIAGNOSIS — Z923 Personal history of irradiation: Secondary | ICD-10-CM | POA: Diagnosis not present

## 2018-07-07 DIAGNOSIS — C61 Malignant neoplasm of prostate: Secondary | ICD-10-CM | POA: Diagnosis not present

## 2018-07-07 NOTE — Progress Notes (Signed)
Radiation Oncology Follow up Note  Name: Jared Herrera   Date:   07/07/2018 MRN:  [id number] DOB: Oct 21, 1950    This 67 y.o. male presents to the clinic today for 18 month follow-up status post I-125 interstitial implant for Gleason 6 adenocarcinoma the prostate.  REFERRING PROVIDER: Kathrine Haddock, NP  HPI: patient is a 67 year old male now out 18 months having completed I-125 interstitial implant for Gleason 6 (3+3) adenocarcinoma prostate presenting with initial PSA of 6.2. Seen today in routine follow-up he is doing well. Specifically denies diarrhea dysuria or any other GI/GU complaints. His most recent PSA was 0.82.Marland Kitchen  COMPLICATIONS OF TREATMENT: none  FOLLOW UP COMPLIANCE: keeps appointments   PHYSICAL EXAM:  BP (!) 141/80 (BP Location: Left Arm, Patient Position: Sitting)   Pulse (!) 107   Temp (!) 96.6 F (35.9 C) (Tympanic)   Resp 18   Wt 204 lb 9.4 oz (92.8 kg)   BMI 29.36 kg/m  On rectal exam rectal sphincter tone is good. Prostate is smooth contracted without evidence of nodularity or mass. Sulcus is preserved bilaterally. No discrete nodularity is identified. No other rectal abnormalities are noted.Well-developed well-nourished patient in NAD. HEENT reveals PERLA, EOMI, discs not visualized.  Oral cavity is clear. No oral mucosal lesions are identified. Neck is clear without evidence of cervical or supraclavicular adenopathy. Lungs are clear to A&P. Cardiac examination is essentially unremarkable with regular rate and rhythm without murmur rub or thrill. Abdomen is benign with no organomegaly or masses noted. Motor sensory and DTR levels are equal and symmetric in the upper and lower extremities. Cranial nerves II through XII are grossly intact. Proprioception is intact. No peripheral adenopathy or edema is identified. No motor or sensory levels are noted. Crude visual fields are within normal range.  RADIOLOGY RESULTS: no current films for review  PLAN: at the present  time he is doing well under good biochemical control of his prostate cancer with very little side effects. I'm please was overall progress. I've asked to see him back in 1 year for follow-up. Will obtain a PSA prior to that visit. Patient knows to call sooner with any concerns.  I would like to take this opportunity to thank you for allowing me to participate in the care of your patient.Noreene Filbert, MD

## 2018-07-18 ENCOUNTER — Ambulatory Visit: Payer: Medicare Other | Admitting: Urology

## 2018-07-18 ENCOUNTER — Encounter: Payer: Self-pay | Admitting: Urology

## 2018-07-28 DIAGNOSIS — Z23 Encounter for immunization: Secondary | ICD-10-CM | POA: Diagnosis not present

## 2018-09-26 ENCOUNTER — Other Ambulatory Visit: Payer: Self-pay | Admitting: Unknown Physician Specialty

## 2018-09-26 NOTE — Telephone Encounter (Signed)
Refill request for lisinopril-hctz; last office visit 03/01/18 with Kathrine Haddock; no upcoming visits noted; last refill 06/23/18 #90; contacted pt and he is agreeable to scheduling appointment; pt offered and accepted appointment with Marnee Guarneri, [place], 09/28/18 at 0930; will route to office for notification of this upcoming appointment; will also grant 30 day refill to cover until upcoming appointment.

## 2018-09-28 ENCOUNTER — Ambulatory Visit (INDEPENDENT_AMBULATORY_CARE_PROVIDER_SITE_OTHER): Payer: Medicare Other | Admitting: Nurse Practitioner

## 2018-09-28 ENCOUNTER — Encounter: Payer: Self-pay | Admitting: Nurse Practitioner

## 2018-09-28 VITALS — BP 105/73 | HR 90 | Temp 98.7°F | Ht 70.0 in | Wt 199.4 lb

## 2018-09-28 DIAGNOSIS — E782 Mixed hyperlipidemia: Secondary | ICD-10-CM

## 2018-09-28 DIAGNOSIS — I10 Essential (primary) hypertension: Secondary | ICD-10-CM

## 2018-09-28 MED ORDER — LISINOPRIL-HYDROCHLOROTHIAZIDE 20-25 MG PO TABS: 1.0000 | ORAL_TABLET | Freq: Every day | ORAL | 6 refills | Status: DC

## 2018-09-28 NOTE — Patient Instructions (Signed)
DASH Eating Plan  DASH stands for "Dietary Approaches to Stop Hypertension." The DASH eating plan is a healthy eating plan that has been shown to reduce high blood pressure (hypertension). It may also reduce your risk for type 2 diabetes, heart disease, and stroke. The DASH eating plan may also help with weight loss.  What are tips for following this plan?    General guidelines   Avoid eating more than 2,300 mg (milligrams) of salt (sodium) a day. If you have hypertension, you may need to reduce your sodium intake to 1,500 mg a day.   Limit alcohol intake to no more than 1 drink a day for nonpregnant women and 2 drinks a day for men. One drink equals 12 oz of beer, 5 oz of wine, or 1 oz of hard liquor.   Work with your health care provider to maintain a healthy body weight or to lose weight. Ask what an ideal weight is for you.   Get at least 30 minutes of exercise that causes your heart to beat faster (aerobic exercise) most days of the week. Activities may include walking, swimming, or biking.   Work with your health care provider or diet and nutrition specialist (dietitian) to adjust your eating plan to your individual calorie needs.  Reading food labels     Check food labels for the amount of sodium per serving. Choose foods with less than 5 percent of the Daily Value of sodium. Generally, foods with less than 300 mg of sodium per serving fit into this eating plan.   To find whole grains, look for the word "whole" as the first word in the ingredient list.  Shopping   Buy products labeled as "low-sodium" or "no salt added."   Buy fresh foods. Avoid canned foods and premade or frozen meals.  Cooking   Avoid adding salt when cooking. Use salt-free seasonings or herbs instead of table salt or sea salt. Check with your health care provider or pharmacist before using salt substitutes.   Do not fry foods. Cook foods using healthy methods such as baking, boiling, grilling, and broiling instead.   Cook with  heart-healthy oils, such as olive, canola, soybean, or sunflower oil.  Meal planning   Eat a balanced diet that includes:  ? 5 or more servings of fruits and vegetables each day. At each meal, try to fill half of your plate with fruits and vegetables.  ? Up to 6-8 servings of whole grains each day.  ? Less than 6 oz of lean meat, poultry, or fish each day. A 3-oz serving of meat is about the same size as a deck of cards. One egg equals 1 oz.  ? 2 servings of low-fat dairy each day.  ? A serving of nuts, seeds, or beans 5 times each week.  ? Heart-healthy fats. Healthy fats called Omega-3 fatty acids are found in foods such as flaxseeds and coldwater fish, like sardines, salmon, and mackerel.   Limit how much you eat of the following:  ? Canned or prepackaged foods.  ? Food that is high in trans fat, such as fried foods.  ? Food that is high in saturated fat, such as fatty meat.  ? Sweets, desserts, sugary drinks, and other foods with added sugar.  ? Full-fat dairy products.   Do not salt foods before eating.   Try to eat at least 2 vegetarian meals each week.   Eat more home-cooked food and less restaurant, buffet, and fast food.     When eating at a restaurant, ask that your food be prepared with less salt or no salt, if possible.  What foods are recommended?  The items listed may not be a complete list. Talk with your dietitian about what dietary choices are best for you.  Grains  Whole-grain or whole-wheat bread. Whole-grain or whole-wheat pasta. Brown rice. Oatmeal. Quinoa. Bulgur. Whole-grain and low-sodium cereals. Pita bread. Low-fat, low-sodium crackers. Whole-wheat flour tortillas.  Vegetables  Fresh or frozen vegetables (raw, steamed, roasted, or grilled). Low-sodium or reduced-sodium tomato and vegetable juice. Low-sodium or reduced-sodium tomato sauce and tomato paste. Low-sodium or reduced-sodium canned vegetables.  Fruits  All fresh, dried, or frozen fruit. Canned fruit in natural juice (without  added sugar).  Meat and other protein foods  Skinless chicken or turkey. Ground chicken or turkey. Pork with fat trimmed off. Fish and seafood. Egg whites. Dried beans, peas, or lentils. Unsalted nuts, nut butters, and seeds. Unsalted canned beans. Lean cuts of beef with fat trimmed off. Low-sodium, lean deli meat.  Dairy  Low-fat (1%) or fat-free (skim) milk. Fat-free, low-fat, or reduced-fat cheeses. Nonfat, low-sodium ricotta or cottage cheese. Low-fat or nonfat yogurt. Low-fat, low-sodium cheese.  Fats and oils  Soft margarine without trans fats. Vegetable oil. Low-fat, reduced-fat, or light mayonnaise and salad dressings (reduced-sodium). Canola, safflower, olive, soybean, and sunflower oils. Avocado.  Seasoning and other foods  Herbs. Spices. Seasoning mixes without salt. Unsalted popcorn and pretzels. Fat-free sweets.  What foods are not recommended?  The items listed may not be a complete list. Talk with your dietitian about what dietary choices are best for you.  Grains  Baked goods made with fat, such as croissants, muffins, or some breads. Dry pasta or rice meal packs.  Vegetables  Creamed or fried vegetables. Vegetables in a cheese sauce. Regular canned vegetables (not low-sodium or reduced-sodium). Regular canned tomato sauce and paste (not low-sodium or reduced-sodium). Regular tomato and vegetable juice (not low-sodium or reduced-sodium). Pickles. Olives.  Fruits  Canned fruit in a light or heavy syrup. Fried fruit. Fruit in cream or butter sauce.  Meat and other protein foods  Fatty cuts of meat. Ribs. Fried meat. Bacon. Sausage. Bologna and other processed lunch meats. Salami. Fatback. Hotdogs. Bratwurst. Salted nuts and seeds. Canned beans with added salt. Canned or smoked fish. Whole eggs or egg yolks. Chicken or turkey with skin.  Dairy  Whole or 2% milk, cream, and half-and-half. Whole or full-fat cream cheese. Whole-fat or sweetened yogurt. Full-fat cheese. Nondairy creamers. Whipped toppings.  Processed cheese and cheese spreads.  Fats and oils  Butter. Stick margarine. Lard. Shortening. Ghee. Bacon fat. Tropical oils, such as coconut, palm kernel, or palm oil.  Seasoning and other foods  Salted popcorn and pretzels. Onion salt, garlic salt, seasoned salt, table salt, and sea salt. Worcestershire sauce. Tartar sauce. Barbecue sauce. Teriyaki sauce. Soy sauce, including reduced-sodium. Steak sauce. Canned and packaged gravies. Fish sauce. Oyster sauce. Cocktail sauce. Horseradish that you find on the shelf. Ketchup. Mustard. Meat flavorings and tenderizers. Bouillon cubes. Hot sauce and Tabasco sauce. Premade or packaged marinades. Premade or packaged taco seasonings. Relishes. Regular salad dressings.  Where to find more information:   National Heart, Lung, and Blood Institute: www.nhlbi.nih.gov   American Heart Association: www.heart.org  Summary   The DASH eating plan is a healthy eating plan that has been shown to reduce high blood pressure (hypertension). It may also reduce your risk for type 2 diabetes, heart disease, and stroke.   With the   DASH eating plan, you should limit salt (sodium) intake to 2,300 mg a day. If you have hypertension, you may need to reduce your sodium intake to 1,500 mg a day.   When on the DASH eating plan, aim to eat more fresh fruits and vegetables, whole grains, lean proteins, low-fat dairy, and heart-healthy fats.   Work with your health care provider or diet and nutrition specialist (dietitian) to adjust your eating plan to your individual calorie needs.  This information is not intended to replace advice given to you by your health care provider. Make sure you discuss any questions you have with your health care provider.  Document Released: 09/16/2011 Document Revised: 09/20/2016 Document Reviewed: 09/20/2016  Elsevier Interactive Patient Education  2019 Elsevier Inc.

## 2018-09-28 NOTE — Assessment & Plan Note (Signed)
Chronic, previous LDL 101.  No current meds.  Lipid panel and CMP today.

## 2018-09-28 NOTE — Assessment & Plan Note (Signed)
Chronic, stable.  BP below goal.  Recommend checking BP at home three times a week.  Continue current regimen and refills placed.

## 2018-09-28 NOTE — Progress Notes (Signed)
BP 105/73   Pulse 90 Comment: apical  Temp 98.7 F (37.1 C) (Oral)   Ht 5\' 10"  (1.778 m)   Wt 199 lb 6.4 oz (90.4 kg)   SpO2 98%   BMI 28.61 kg/m    Subjective:    Patient ID: Jared Herrera, male    DOB: 09-Nov-1950, 67 y.o.   MRN: [id number]  HPI: Jared Herrera is a 67 y.o. male presents for HTN/HLD follow-up  Chief Complaint  Patient presents with  . Hyperlipidemia  . Hypertension   HYPERTENSION / [place] Satisfied with current treatment? yes Duration of hypertension: chronic BP monitoring frequency: rarely BP range: has not checked in a little while BP medication side effects: no Duration of hyperlipidemia: chronic, no current medications Cholesterol medication side effects: no current medications Cholesterol supplements: none Aspirin: yes Recent stressors: no Recurrent headaches: no Visual changes: no Palpitations: no Dyspnea: no Chest pain: no Lower extremity edema: no Dizzy/lightheaded: no  Relevant past medical, surgical, family and social history reviewed and updated as indicated. Interim medical history since our last visit reviewed. Allergies and medications reviewed and updated.  Review of Systems  Constitutional: Negative for activity change, diaphoresis, fatigue and fever.  Respiratory: Negative for cough, chest tightness, shortness of breath and wheezing.   Cardiovascular: Negative for chest pain, palpitations and leg swelling.  Gastrointestinal: Negative for abdominal distention, abdominal pain, constipation, diarrhea, nausea and vomiting.  Endocrine: Negative for cold intolerance, heat intolerance, polydipsia, polyphagia and polyuria.  Musculoskeletal: Negative.   Skin: Negative.   Neurological: Negative for dizziness, syncope, weakness, light-headedness, numbness and headaches.  Psychiatric/Behavioral: Negative.     Per HPI unless specifically indicated above     Objective:    BP 105/73   Pulse 90 Comment: apical   Temp 98.7 F (37.1 C) (Oral)   Ht 5\' 10"  (1.778 m)   Wt 199 lb 6.4 oz (90.4 kg)   SpO2 98%   BMI 28.61 kg/m   Wt Readings from Last 3 Encounters:  09/28/18 199 lb 6.4 oz (90.4 kg)  07/07/18 204 lb 9.4 oz (92.8 kg)  07/04/18 200 lb (90.7 kg)    Physical Exam Vitals signs and nursing note reviewed.  Constitutional:      Appearance: He is well-developed.  HENT:     Head: Normocephalic and atraumatic.     Right Ear: Hearing normal. No drainage.     Left Ear: Hearing normal. No drainage.     Mouth/Throat:     Pharynx: Uvula midline.  Eyes:     General: Lids are normal.        Right eye: No discharge.        Left eye: No discharge.     Conjunctiva/sclera: Conjunctivae normal.     Pupils: Pupils are equal, round, and reactive to light.  Neck:     Musculoskeletal: Normal range of motion and neck supple.     Thyroid: No thyromegaly.     Vascular: No carotid bruit or JVD.     Trachea: Trachea normal.  Cardiovascular:     Rate and Rhythm: Normal rate and regular rhythm.     Heart sounds: Normal heart sounds, S1 normal and S2 normal. No murmur. No gallop.   Pulmonary:     Effort: Pulmonary effort is normal.     Breath sounds: Normal breath sounds.  Abdominal:     General: Bowel sounds are normal.     Palpations: Abdomen is soft. There is no hepatomegaly or splenomegaly.  Musculoskeletal:  Normal range of motion.  Skin:    General: Skin is warm and dry.     Capillary Refill: Capillary refill takes less than 2 seconds.     Findings: No rash.  Neurological:     Mental Status: He is alert and oriented to person, place, and time.     Deep Tendon Reflexes: Reflexes are normal and symmetric.  Psychiatric:        Mood and Affect: Mood normal.        Behavior: Behavior normal.        Thought Content: Thought content normal.        Judgment: Judgment normal.     Results for orders placed or performed during the hospital encounter of 07/04/18  Glucose, capillary  Result Value Ref  Range   Glucose-Capillary 153 (H) 70 - 99 mg/dL   Comment 1 Notify RN       Assessment & Plan:   Problem List Items Addressed This Visit      Cardiovascular and Mediastinum   Hypertension    Chronic, stable.  BP below goal.  Recommend checking BP at home three times a week.  Continue current regimen and refills placed.      Relevant Medications   lisinopril-hydrochlorothiazide (PRINZIDE,ZESTORETIC) 20-25 MG tablet     Other   Hyperlipemia - Primary    Chronic, previous LDL 101.  No current meds.  Lipid panel and CMP today.      Relevant Medications   lisinopril-hydrochlorothiazide (PRINZIDE,ZESTORETIC) 20-25 MG tablet   Other Relevant Orders   Lipid Panel w/o Chol/HDL Ratio   Comprehensive metabolic panel       Follow up plan: Return in about 6 months (around 03/30/2019) for HLD/HTN.

## 2018-09-29 ENCOUNTER — Telehealth: Payer: Self-pay | Admitting: Nurse Practitioner

## 2018-09-29 LAB — LIPID PANEL W/O CHOL/HDL RATIO
Cholesterol, Total: 171 mg/dL (ref 100–199)
HDL: 42 mg/dL (ref >39)
LDL Calculated: 115 mg/dL — ABNORMAL HIGH (ref 0–99)
TRIGLYCERIDES: 71 mg/dL (ref 0–149)
VLDL Cholesterol Cal: 14 mg/dL (ref 5–40)

## 2018-09-29 LAB — COMPREHENSIVE METABOLIC PANEL
ALBUMIN: 4.3 g/dL (ref 3.6–4.8)
ALK PHOS: 79 IU/L (ref 39–117)
ALT: 31 IU/L (ref 0–44)
AST: 23 IU/L (ref 0–40)
Albumin/Globulin Ratio: 1.5 (ref 1.2–2.2)
BILIRUBIN TOTAL: 0.6 mg/dL (ref 0.0–1.2)
BUN / CREAT RATIO: 15 (ref 10–24)
BUN: 13 mg/dL (ref 8–27)
CHLORIDE: 98 mmol/L (ref 96–106)
CO2: 24 mmol/L (ref 20–29)
Calcium: 9.8 mg/dL (ref 8.6–10.2)
Creatinine, Ser: 0.84 mg/dL (ref 0.76–1.27)
GFR calc Af Amer: 104 mL/min/{1.73_m2} (ref >59)
GFR calc non Af Amer: 90 mL/min/{1.73_m2} (ref >59)
GLUCOSE: 107 mg/dL — AB (ref 65–99)
Globulin, Total: 2.9 g/dL (ref 1.5–4.5)
POTASSIUM: 4.4 mmol/L (ref 3.5–5.2)
Sodium: 137 mmol/L (ref 134–144)
Total Protein: 7.2 g/dL (ref 6.0–8.5)

## 2018-09-29 NOTE — Telephone Encounter (Signed)
Patient notified of labs.   

## 2018-09-29 NOTE — Telephone Encounter (Signed)
Copied from [place] [phone]. Topic: Quick Communication - See Telephone Encounter >> Sep 29, 2018  8:53 AM Burchel, Abbi R wrote: CRM for notification. See Telephone encounter for: 09/29/18.  Pt returning call to [facility].  Please call pt.   Pt: [phone]

## 2018-12-27 IMAGING — CR DG CHEST 2V
2 series · 2 of 2 positions shown · non-contrast
Comparison: None in PACs

CLINICAL DATA: Preoperative examination prior prostate surgery.
History of hypertension on medication, former smoker.

EXAM:
CHEST  2 VIEW

[chest pa]
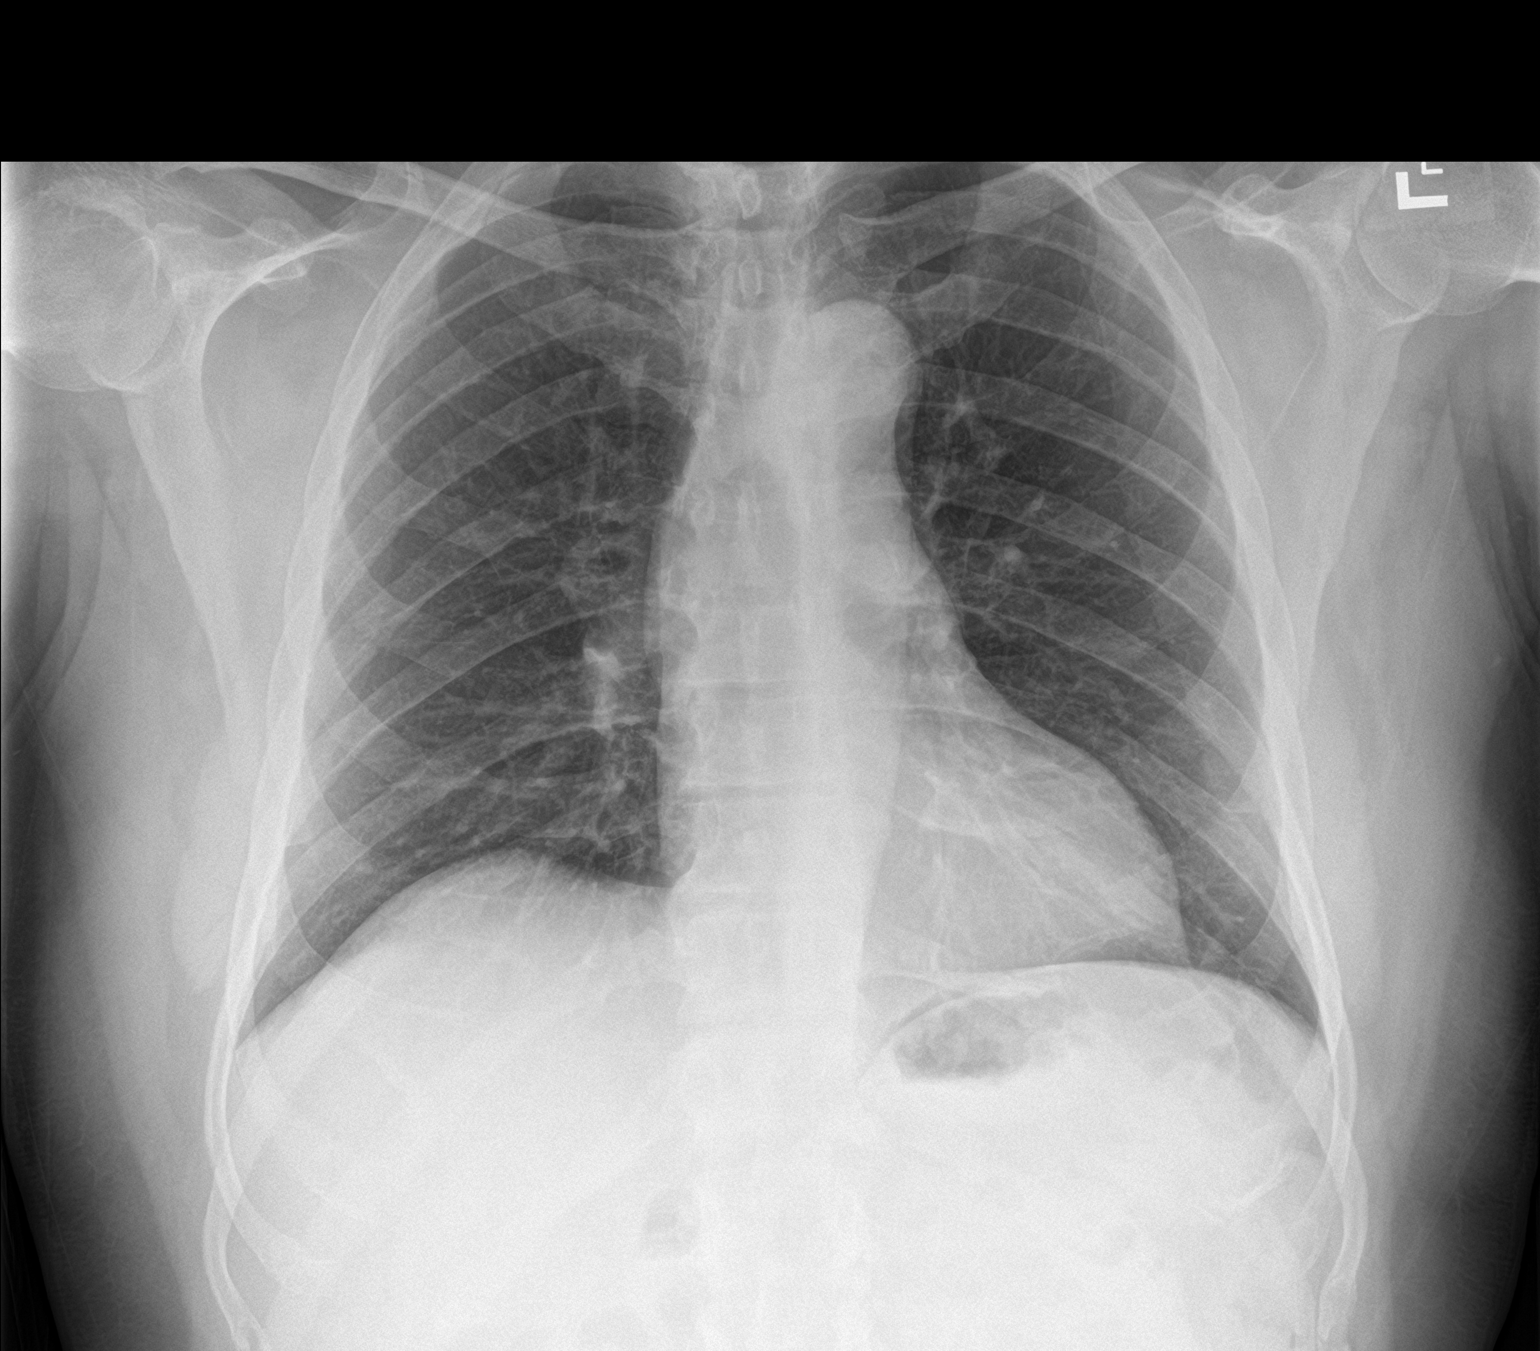

[chest lat]
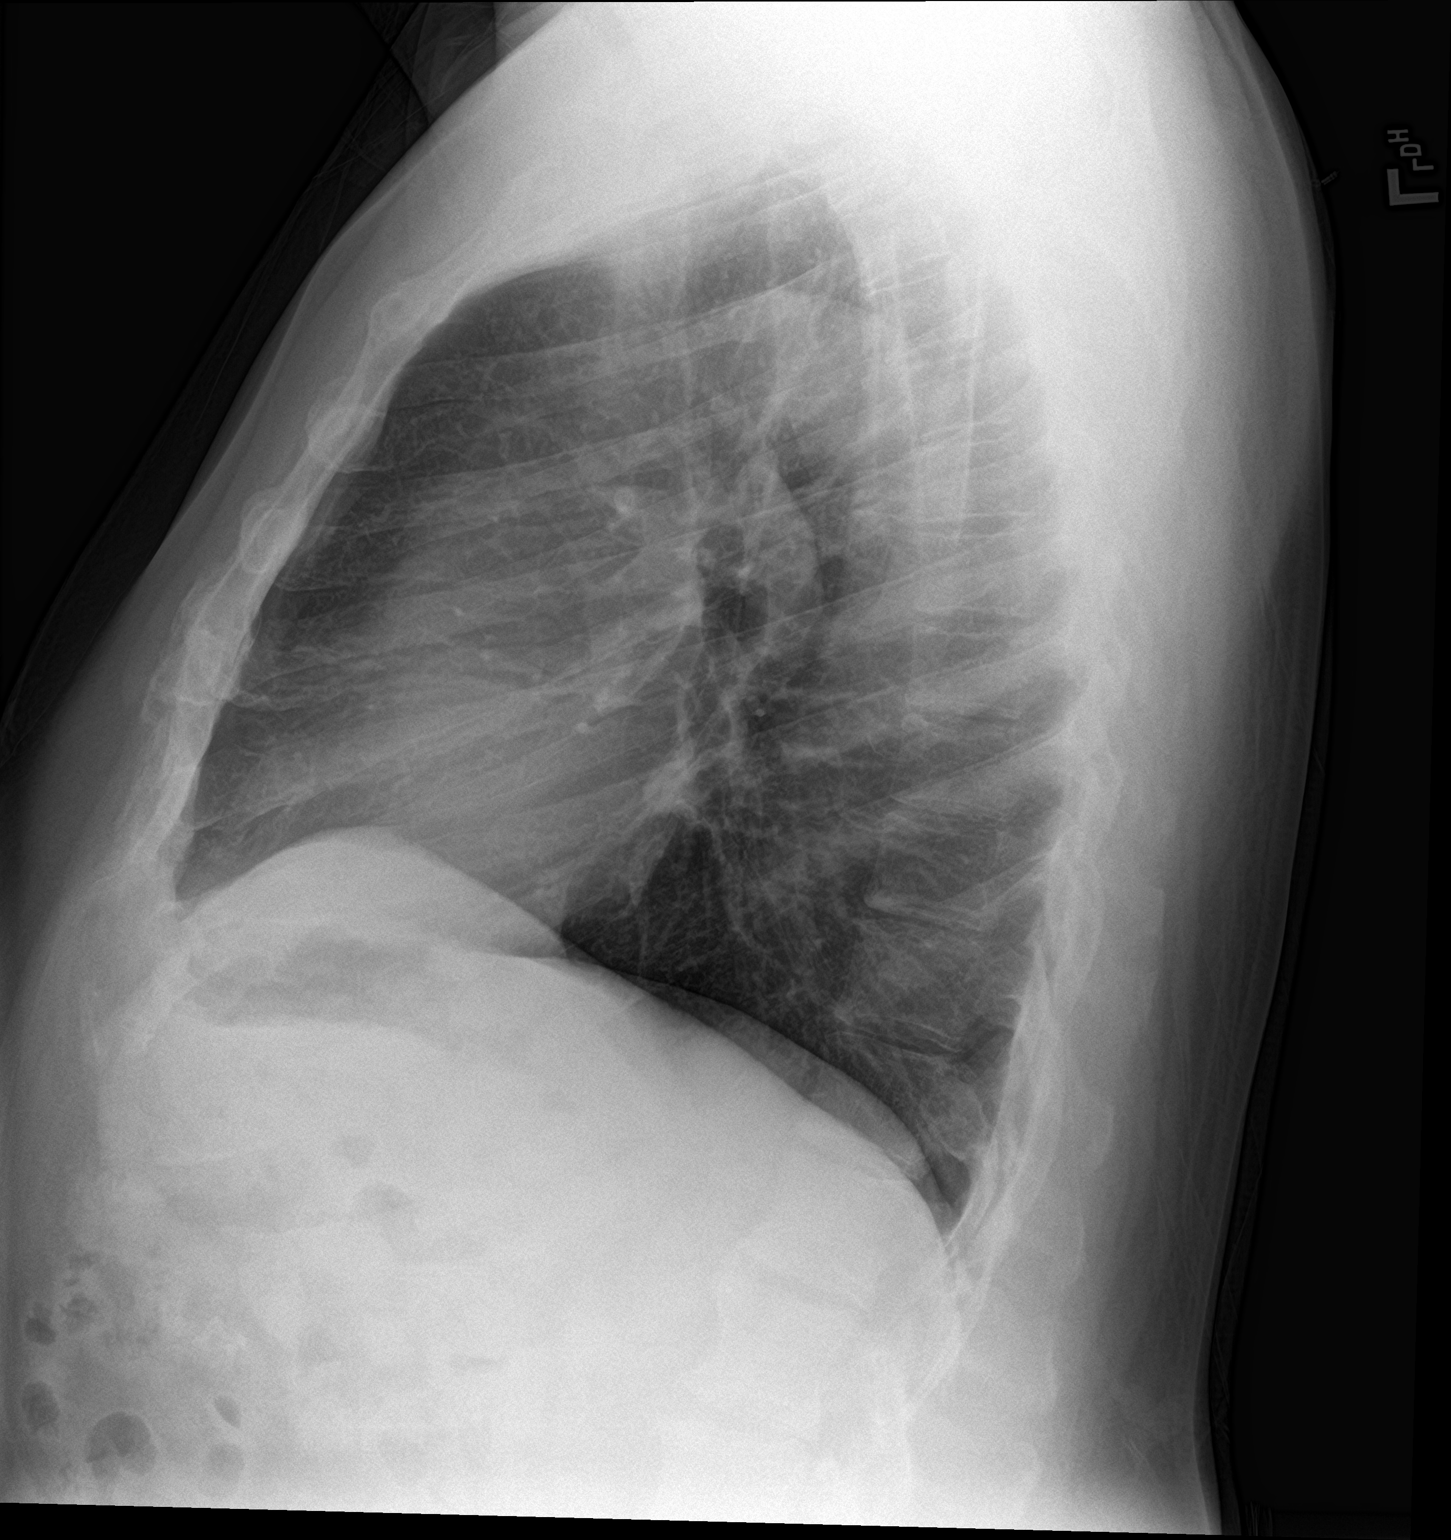

[2 of 2 positions shown; findings below may reference images not displayed]

FINDINGS: The lungs are adequately inflated and clear. The heart and pulmonary
vascularity are normal. The mediastinum is normal in width. There is
no pleural effusion. The trachea is midline. The bony thorax
exhibits no acute abnormality.
IMPRESSION: There is no pneumonia nor other acute cardiopulmonary abnormality.

## 2019-01-17 ENCOUNTER — Telehealth: Payer: Self-pay

## 2019-01-17 NOTE — Telephone Encounter (Signed)
Patient scheduled for an AWV on 01/22/2019 with [facility], Due to Covid-19 pandemic this is unable to be done in office, called patient to see if he was able to do this virtually or if needed to reschedule for after June 2020. LM for patient to call back Direct call back [phone]

## 2019-01-22 ENCOUNTER — Ambulatory Visit: Payer: Self-pay

## 2019-03-26 ENCOUNTER — Telehealth: Payer: Self-pay | Admitting: Nurse Practitioner

## 2019-03-26 NOTE — Chronic Care Management (AMB) (Signed)
Chronic Care Management   Note  03/26/2019 Name: Jared Herrera MRN: [id number] DOB: 1950/12/20  [name] is a 68 y.o. year old male who is a primary care patient of Cannady, Barbaraann Faster, NP. I reached out to Randolm Idol by phone today in response to a referral sent by [name] health plan.    [name] was given information about Chronic Care Management services today including:  1. CCM service includes personalized support from designated clinical staff supervised by his physician, including individualized plan of care and coordination with other care providers 2. 24/7 contact phone numbers for assistance for urgent and routine care needs. 3. Service will only be billed when office clinical staff spend 20 minutes or more in a month to coordinate care. 4. Only one practitioner may furnish and bill the service in a calendar month. 5. The patient may stop CCM services at any time (effective at the end of the month) by phone call to the office staff. 6. The patient will be responsible for cost sharing (co-pay) of up to 20% of the service fee (after annual deductible is met).  Patient agreed to services and verbal consent obtained.   Follow up plan: Telephone appointment with CCM team member scheduled for: 04/11/2019  [place]  ??[email or web address]   ??[phone]

## 2019-04-03 ENCOUNTER — Other Ambulatory Visit: Payer: Self-pay

## 2019-04-03 ENCOUNTER — Encounter: Payer: Self-pay | Admitting: Nurse Practitioner

## 2019-04-03 ENCOUNTER — Ambulatory Visit (INDEPENDENT_AMBULATORY_CARE_PROVIDER_SITE_OTHER): Payer: Medicare Other | Admitting: Nurse Practitioner

## 2019-04-03 VITALS — BP 128/78 | HR 91 | Temp 98.5°F | Ht 70.0 in | Wt 197.0 lb

## 2019-04-03 DIAGNOSIS — R7301 Impaired fasting glucose: Secondary | ICD-10-CM

## 2019-04-03 DIAGNOSIS — I10 Essential (primary) hypertension: Secondary | ICD-10-CM | POA: Diagnosis not present

## 2019-04-03 DIAGNOSIS — E782 Mixed hyperlipidemia: Secondary | ICD-10-CM | POA: Diagnosis not present

## 2019-04-03 DIAGNOSIS — M79641 Pain in right hand: Secondary | ICD-10-CM

## 2019-04-03 NOTE — Patient Instructions (Signed)
Hand Pain  Many things can cause hand pain. Some common causes are:  · An injury.  · Repeating the same movement with your hand over and over (overuse).  · Osteoporosis.  · Arthritis.  · Lumps in the tendons or joints of the hand and wrist (ganglion cysts).  · Infection.  Follow these instructions at home:  Pay attention to any changes in your symptoms. Take these actions to help with your discomfort:  · If directed, put ice on the affected area:  ? Put ice in a plastic bag.  ? Place a towel between your skin and the bag.  ? Leave the ice on for 15-20 minutes, 3?4 times a day for 2 days.  · Take over-the-counter and prescription medicines only as told by your health care provider.  · Minimize stress on your hands and wrists as much as possible.  · Take breaks from repetitive activity often.  · Do stretches as told by your health care provider.  · Do not do activities that make your pain worse.  Contact a health care provider if:  · Your pain does not get better after a few days of self-care.  · Your pain gets worse.  · Your pain affects your ability to do your daily activities.  Get help right away if:  · Your hand becomes warm, red, or swollen.  · Your hand is numb or tingling.  · Your hand is extremely swollen or deformed.  · Your hand or fingers turn white or blue.  · You cannot move your hand, wrist, or fingers.  This information is not intended to replace advice given to you by your health care provider. Make sure you discuss any questions you have with your health care provider.  Document Released: 10/24/2015 Document Revised: 03/04/2016 Document Reviewed: 10/23/2014  Elsevier Interactive Patient Education © 2019 [employer]

## 2019-04-03 NOTE — Progress Notes (Signed)
BP 128/78 (BP Location: Left Arm)   Pulse 91   Temp 98.5 F (36.9 C) (Oral)   Ht 5\' 10"  (1.778 m)   Wt 197 lb (89.4 kg)   SpO2 97%   BMI 28.27 kg/m    Subjective:    Patient ID: Jared Herrera, male    DOB: October 02, 1951, 68 y.o.   MRN: [id number]  HPI: Jared Herrera is a 68 y.o. male  Chief Complaint  Patient presents with  . Hypertension  . Hyperlipidemia  . Hand Pain    x 6 months. Right hand. Joint pops.    HYPERTENSION / HYPERLIPIDEMIA Continues on Lisinopril-HCTZ and ASA, no statin.  Discussed statin use, he agrees if continued elevation then will start statin. Satisfied with current treatment? yes Duration of hypertension: chronic BP monitoring frequency: not checking BP range:  BP medication side effects: no Duration of hyperlipidemia: chronic Cholesterol medication side effects: no Cholesterol supplements: none Medication compliance: good compliance Aspirin: yes Recent stressors: no Recurrent headaches: no Visual changes: no Palpitations: no Dyspnea: no Chest pain: no Lower extremity edema: no Dizzy/lightheaded: no   The 10-year ASCVD risk score Mikey Bussing DC Jr., et al., 2013) is: 18.5%   Values used to calculate the score:     Age: 56 years     Sex: Male     Is Non-Hispanic African American: Yes     Diabetic: No     Tobacco smoker: No     Systolic Blood Pressure: 003 mmHg     Is BP treated: Yes     HDL Cholesterol: 42 mg/dL     Total Cholesterol: 171 mg/dL  HAND PAIN Right hand started last year, no pain.  Reports popping under third finger and then pops back in.  Notices it more when presses really hard, such as mowing lawn.   Duration: months Involved hand: right Mechanism of injury: unknown Location: dorsal Onset: gradual Severity: no pain  Frequency: intermittent Radiation: no Aggravating factors: pushing lawn mower or gripping really tight on item Alleviating factors: rest Treatments attempted:  Relief with NSAIDs?: No NSAIDs  Taken Weakness: no Numbness: no Redness: no Swelling:no Bruising: no Fevers: no   IFG: Last A1C May 2019 was 5.5.  Denies polyuria, polyphagia, polydipsia.  Relevant past medical, surgical, family and social history reviewed and updated as indicated. Interim medical history since our last visit reviewed. Allergies and medications reviewed and updated.  Review of Systems  Constitutional: Negative for activity change, diaphoresis, fatigue and fever.  Respiratory: Negative for cough, chest tightness, shortness of breath and wheezing.   Cardiovascular: Negative for chest pain, palpitations and leg swelling.  Gastrointestinal: Negative for abdominal distention, abdominal pain, constipation, diarrhea, nausea and vomiting.  Endocrine: Negative for cold intolerance, heat intolerance, polydipsia, polyphagia and polyuria.  Musculoskeletal: Positive for arthralgias.  Skin: Negative.   Neurological: Negative for dizziness, syncope, weakness, light-headedness, numbness and headaches.  Psychiatric/Behavioral: Negative.     Per HPI unless specifically indicated above     Objective:    BP 128/78 (BP Location: Left Arm)   Pulse 91   Temp 98.5 F (36.9 C) (Oral)   Ht 5\' 10"  (1.778 m)   Wt 197 lb (89.4 kg)   SpO2 97%   BMI 28.27 kg/m   Wt Readings from Last 3 Encounters:  04/03/19 197 lb (89.4 kg)  09/28/18 199 lb 6.4 oz (90.4 kg)  07/07/18 204 lb 9.4 oz (92.8 kg)    Physical Exam Vitals signs and nursing note reviewed.  Constitutional:      General: He is awake. He is not in acute distress.    Appearance: He is well-developed. He is not ill-appearing.  HENT:     Head: Normocephalic and atraumatic.     Right Ear: Hearing normal. No drainage.     Left Ear: Hearing normal. No drainage.     Mouth/Throat:     Pharynx: Uvula midline.  Eyes:     General: Lids are normal.        Right eye: No discharge.        Left eye: No discharge.     Conjunctiva/sclera: Conjunctivae normal.      Pupils: Pupils are equal, round, and reactive to light.  Neck:     Musculoskeletal: Normal range of motion and neck supple.     Thyroid: No thyromegaly.     Vascular: No carotid bruit or JVD.     Trachea: Trachea normal.  Cardiovascular:     Rate and Rhythm: Normal rate and regular rhythm.     Heart sounds: Normal heart sounds, S1 normal and S2 normal. No murmur. No gallop.   Pulmonary:     Effort: Pulmonary effort is normal. No accessory muscle usage or respiratory distress.     Breath sounds: Normal breath sounds.  Abdominal:     General: Bowel sounds are normal.     Palpations: Abdomen is soft. There is no hepatomegaly or splenomegaly.  Musculoskeletal: Normal range of motion.     Right hand: He exhibits normal range of motion, no tenderness and normal capillary refill. Normal sensation noted. Normal strength noted.     Left hand: He exhibits normal range of motion, no tenderness and normal capillary refill. Normal sensation noted. Normal strength noted.     Right lower leg: No edema.     Left lower leg: No edema.     Comments: Right hand with full ROM and strength.  Occasional popping noted under right third finger with increased grip.    Skin:    General: Skin is warm and dry.     Capillary Refill: Capillary refill takes less than 2 seconds.     Findings: No rash.  Neurological:     Mental Status: He is alert and oriented to person, place, and time.     Deep Tendon Reflexes: Reflexes are normal and symmetric.  Psychiatric:        Mood and Affect: Mood normal.        Behavior: Behavior normal. Behavior is cooperative.        Thought Content: Thought content normal.        Judgment: Judgment normal.     Results for orders placed or performed in visit on 09/28/18  Lipid Panel w/o Chol/HDL Ratio  Result Value Ref Range   Cholesterol, Total 171 100 - 199 mg/dL   Triglycerides 71 0 - 149 mg/dL   HDL 42 >39 mg/dL   VLDL Cholesterol Cal 14 5 - 40 mg/dL   LDL Calculated 115 (H)  0 - 99 mg/dL  Comprehensive metabolic panel  Result Value Ref Range   Glucose 107 (H) 65 - 99 mg/dL   BUN 13 8 - 27 mg/dL   Creatinine, Ser 0.84 0.76 - 1.27 mg/dL   GFR calc non Af Amer 90 >59 mL/min/1.73   GFR calc Af Amer 104 >59 mL/min/1.73   BUN/Creatinine Ratio 15 10 - 24   Sodium 137 134 - 144 mmol/L   Potassium 4.4 3.5 - 5.2 mmol/L  Chloride 98 96 - 106 mmol/L   CO2 24 20 - 29 mmol/L   Calcium 9.8 8.6 - 10.2 mg/dL   Total Protein 7.2 6.0 - 8.5 g/dL   Albumin 4.3 3.6 - 4.8 g/dL   Globulin, Total 2.9 1.5 - 4.5 g/dL   Albumin/Globulin Ratio 1.5 1.2 - 2.2   Bilirubin Total 0.6 0.0 - 1.2 mg/dL   Alkaline Phosphatase 79 39 - 117 IU/L   AST 23 0 - 40 IU/L   ALT 31 0 - 44 IU/L      Assessment & Plan:   Problem List Items Addressed This Visit      Cardiovascular and Mediastinum   Hypertension - Primary    Chronic, ongoing.  Initial BP elevated, but repeat at goal.  Recommend checking BP three mornings a week at home and documenting.  Continue current medication regimen and adjust as needed.  Labs today.        Relevant Orders   Comprehensive metabolic panel     Endocrine   IFG (impaired fasting glucose)    A1C today.      Relevant Orders   HgB A1c     Other   Hyperlipemia    Chronic, ongoing.  Suspect will have continued elevation, will start statin if so.  Patient agrees with plan of care.  Would start low and work our way up to recommended dose.      Relevant Orders   Comprehensive metabolic panel   Lipid Panel w/o Chol/HDL Ratio   Right hand pain    Ongoing, intermittent over past year.  No pain.  Refuses imaging at this time.  Will return if worsening symptoms.          Follow up plan: Return in about 6 months (around 10/03/2019) for Annual exam.

## 2019-04-03 NOTE — Assessment & Plan Note (Signed)
Chronic, ongoing.  Initial BP elevated, but repeat at goal.  Recommend checking BP three mornings a week at home and documenting.  Continue current medication regimen and adjust as needed.  Labs today.

## 2019-04-03 NOTE — Assessment & Plan Note (Signed)
Ongoing, intermittent over past year.  No pain.  Refuses imaging at this time.  Will return if worsening symptoms.

## 2019-04-03 NOTE — Assessment & Plan Note (Signed)
A1C today. 

## 2019-04-03 NOTE — Assessment & Plan Note (Signed)
Chronic, ongoing.  Suspect will have continued elevation, will start statin if so.  Patient agrees with plan of care.  Would start low and work our way up to recommended dose.

## 2019-04-04 ENCOUNTER — Other Ambulatory Visit: Payer: Self-pay | Admitting: Nurse Practitioner

## 2019-04-04 LAB — HEMOGLOBIN A1C
Est. average glucose Bld gHb Est-mCnc: 126 mg/dL
Hgb A1c MFr Bld: 6 % — ABNORMAL HIGH (ref 4.8–5.6)

## 2019-04-04 LAB — LIPID PANEL W/O CHOL/HDL RATIO
Cholesterol, Total: 160 mg/dL (ref 100–199)
HDL: 41 mg/dL (ref >39)
LDL Calculated: 105 mg/dL — ABNORMAL HIGH (ref 0–99)
Triglycerides: 70 mg/dL (ref 0–149)
VLDL Cholesterol Cal: 14 mg/dL (ref 5–40)

## 2019-04-04 LAB — COMPREHENSIVE METABOLIC PANEL
ALT: 24 IU/L (ref 0–44)
AST: 19 IU/L (ref 0–40)
Albumin/Globulin Ratio: 1.4 (ref 1.2–2.2)
Albumin: 4.2 g/dL (ref 3.8–4.8)
Alkaline Phosphatase: 79 IU/L (ref 39–117)
BUN/Creatinine Ratio: 18 (ref 10–24)
BUN: 14 mg/dL (ref 8–27)
Bilirubin Total: 0.6 mg/dL (ref 0.0–1.2)
CO2: 27 mmol/L (ref 20–29)
Calcium: 9.3 mg/dL (ref 8.6–10.2)
Chloride: 97 mmol/L (ref 96–106)
Creatinine, Ser: 0.76 mg/dL (ref 0.76–1.27)
GFR calc Af Amer: 108 mL/min/{1.73_m2} (ref >59)
GFR calc non Af Amer: 93 mL/min/{1.73_m2} (ref >59)
Globulin, Total: 3 g/dL (ref 1.5–4.5)
Glucose: 114 mg/dL — ABNORMAL HIGH (ref 65–99)
Potassium: 4 mmol/L (ref 3.5–5.2)
Sodium: 138 mmol/L (ref 134–144)
Total Protein: 7.2 g/dL (ref 6.0–8.5)

## 2019-04-04 MED ORDER — ATORVASTATIN CALCIUM 10 MG PO TABS: 10.0000 mg | ORAL_TABLET | Freq: Every day | ORAL | 3 refills | Status: DC

## 2019-04-04 MED ORDER — LISINOPRIL-HYDROCHLOROTHIAZIDE 20-25 MG PO TABS: 1.0000 | ORAL_TABLET | Freq: Every day | ORAL | 2 refills | Status: DC

## 2019-04-04 NOTE — Progress Notes (Signed)
Refill on BP medication and addition of statin as discussed with patient.

## 2019-04-11 ENCOUNTER — Telehealth: Payer: Medicare Other

## 2019-04-26 ENCOUNTER — Ambulatory Visit (INDEPENDENT_AMBULATORY_CARE_PROVIDER_SITE_OTHER): Payer: Medicare Other

## 2019-04-26 VITALS — BP 147/72 | Ht 68.0 in | Wt 190.0 lb

## 2019-04-26 DIAGNOSIS — Z0000 Encounter for general adult medical examination without abnormal findings: Secondary | ICD-10-CM

## 2019-04-26 NOTE — Progress Notes (Signed)
Subjective:   Jared Herrera is a 68 y.o. male who presents for Medicare Annual/Subsequent preventive examination.  Review of Systems:   Cardiac Risk Factors include: male gender;advanced age (>22men, >63 women);hypertension;dyslipidemia     Objective:    Vitals: BP (!) 147/72 Comment: patient reported  Ht 5\' 8"  (1.727 m)   Wt 190 lb (86.2 kg) Comment: patient reported  BMI 28.89 kg/m   Body mass index is 28.89 kg/m.  Advanced Directives 04/26/2019 07/07/2018 07/04/2018 01/20/2018 12/29/2017 06/29/2017 03/01/2017  Does Patient Have a Medical Advance Directive? No No No No No No No  Would patient like information on creating a medical advance directive? - No - Patient declined No - Patient declined Yes (MAU/Ambulatory/Procedural Areas - Information given) No - Patient declined No - Patient declined No - Patient declined    Tobacco Social History   Tobacco Use  Smoking Status Former Smoker  . Packs/day: 0.50  . Years: 8.00  . Pack years: 4.00  . Types: Cigarettes  . Quit date: 10/11/1978  . Years since quitting: 40.5  Smokeless Tobacco Never Used     Counseling given: Not Answered   Clinical Intake:  Pre-visit preparation completed: Yes  Pain : No/denies pain     Nutritional Risks: None Diabetes: No  How often do you need to have someone help you when you read instructions, pamphlets, or other written materials from your doctor or pharmacy?: 1 - Never  Interpreter Needed?: No  Information entered by :: Sharene Krikorian,LPN  Past Medical History:  Diagnosis Date  . Arthritis   . Cancer [facility])    Prostate  . Cataract    bilateral  . GERD (gastroesophageal reflux disease)   . Hypertension    controlled on meds  . Sinus drainage    Past Surgical History:  Procedure Laterality Date  . COLONOSCOPY WITH PROPOFOL N/A 04/05/2016   Procedure: COLONOSCOPY WITH PROPOFOL;  Surgeon: Lucilla Lame, MD;  Location: [place];  Service: Endoscopy;  Laterality: N/A;   . PROSTATE BIOPSY  March 2017   showed some cells, but they are slow growing  . RADIOACTIVE SEED IMPLANT N/A 02/01/2017   Procedure: RADIOACTIVE SEED IMPLANT/BRACHYTHERAPY IMPLANT;  Surgeon: Hollice Espy, MD;  Location: [facility];  Service: Urology;  Laterality: N/A;  . TONSILLECTOMY AND ADENOIDECTOMY    . WISDOM TOOTH EXTRACTION     Family History  Problem Relation Age of Onset  . Heart disease Brother   . Hypertension Brother   . Hypertension Mother   . Kidney disease Mother   . Cancer Sister        thyroid  . Lupus Daughter   . Kidney disease Daughter   . COPD Neg Hx   . Diabetes Neg Hx   . Stroke Neg Hx    Social History   Socioeconomic History  . Marital status: Legally Separated    Spouse name: Not on file  . Number of children: Not on file  . Years of education: Not on file  . Highest education level: 10th grade  Occupational History  . Occupation: retired  Scientific laboratory technician  . Financial resource strain: Not hard at all  . Food insecurity    Worry: Never true    Inability: Never true  . Transportation needs    Medical: No    Non-medical: No  Tobacco Use  . Smoking status: Former Smoker    Packs/day: 0.50    Years: 8.00    Pack years: 4.00  Types: Cigarettes    Quit date: 10/11/1978    Years since quitting: 40.5  . Smokeless tobacco: Never Used  Substance and Sexual Activity  . Alcohol use: Yes    Alcohol/week: 0.0 standard drinks    Comment: occasional glass of wine  . Drug use: No  . Sexual activity: Never  Lifestyle  . Physical activity    Days per week: 0 days    Minutes per session: 0 min  . Stress: Not at all  Relationships  . Social connections    Talks on phone: More than three times a week    Gets together: More than three times a week    Attends religious service: More than 4 times per year    Active member of club or organization: No    Attends meetings of clubs or organizations: Never    Relationship status: Separated  Other Topics  Concern  . Not on file  Social History Narrative  . Not on file    Outpatient Encounter Medications as of 04/26/2019  Medication Sig  . aspirin 81 MG tablet Take 81 mg by mouth daily. Am, Reported on 11/11/2015  . atorvastatin (LIPITOR) 10 MG tablet Take 1 tablet (10 mg total) by mouth daily.  . Cholecalciferol (VITAMIN D3 PO) Take 1,000 Units by mouth daily.  Marland Kitchen lisinopril-hydrochlorothiazide (ZESTORETIC) 20-25 MG tablet Take 1 tablet by mouth daily.  Marland Kitchen loratadine (CLARITIN) 10 MG tablet Take 10 mg by mouth daily. am  . Specialty Vitamins Products (ULTRA MAN PO) Take 2 tablets by mouth daily. Am,2 tablets daily  . [DISCONTINUED] vitamin C (ASCORBIC ACID) 500 MG tablet Take 500 mg by mouth daily.   No facility-administered encounter medications on file as of 04/26/2019.     Activities of Daily Living In your present state of health, do you have any difficulty performing the following activities: 04/26/2019  Hearing? N  Vision? N  Difficulty concentrating or making decisions? N  Walking or climbing stairs? N  Dressing or bathing? N  Doing errands, shopping? N  Preparing Food and eating ? N  Using the Toilet? N  In the past six months, have you accidently leaked urine? N  Do you have problems with loss of bowel control? N  Managing your Medications? N  Managing your Finances? N  Housekeeping or managing your Housekeeping? N  Some recent data might be hidden    Patient Care Team: Venita Lick, NP as PCP - General (Nurse Practitioner) Hollice Espy, MD as Consulting Physician (Urology) Minor, Dalbert Garnet, RN as [place] Management   Assessment:   This is a routine wellness examination for [place].  Exercise Activities and Dietary recommendations Current Exercise Habits: The patient has a physically strenuous job, but has no regular exercise apart from work., Exercise limited by: None identified  Goals    . DIET - INCREASE WATER INTAKE     Recommend  continue drinking at least 6-8 glasses of water a day        Fall Risk: Fall Risk  04/26/2019 01/20/2018 06/29/2017 03/01/2017 01/18/2017  Falls in the past year? 0 No No No No    FALL RISK PREVENTION PERTAINING TO THE HOME:  Any stairs in or around the home? No  If so, are there any without handrails? No   Home free of loose throw rugs in walkways, pet beds, electrical cords, etc? Yes  Adequate lighting in your home to reduce risk of falls? Yes   ASSISTIVE DEVICES UTILIZED  TO PREVENT FALLS:  Life alert? No  Use of a cane, walker or w/c? No  Grab bars in the bathroom? No  Shower chair or bench in shower? No  Elevated toilet seat or a handicapped toilet? No   TIMED UP AND GO:  Unable to perform   Depression Screen PHQ 2/9 Scores 04/26/2019 01/20/2018 06/29/2017 03/01/2017  PHQ - 2 Score 0 1 0 0  PHQ- 9 Score - 4 - -    Cognitive Function     6CIT Screen 04/26/2019 01/20/2018  What Year? 0 points 0 points  What month? 0 points 0 points  What time? 0 points 0 points  Count back from 20 0 points 0 points  Months in reverse 0 points 0 points  Repeat phrase 0 points 2 points  Total Score 0 2    Immunization History  Administered Date(s) Administered  . Influenza, High Dose Seasonal PF 08/20/2016, 07/28/2018  . Influenza,inj,Quad PF,6+ Mos 10/10/2015  . Influenza-Unspecified 08/25/2018  . Pneumococcal Conjugate-13 01/15/2015  . Pneumococcal Polysaccharide-23 01/16/2016  . Td 06/20/2007  . Zoster 01/11/2014    Qualifies for Shingles Vaccine? Yes  Zostavax completed 01/11/2014. Due for Shingrix. Education has been provided regarding the importance of this vaccine. Pt has been advised to call insurance company to determine out of pocket expense. Advised may also receive vaccine at local pharmacy or Health Dept. Verbalized acceptance and understanding.  Tdap: Discussed need for TD/TDAP vaccine, patient verbalized understanding that this is not covered as a preventative with  there insurance and to call the office if he develops any new skin injuries, ie: cuts, scrapes, bug bites, or open wounds.   Flu Vaccine: up to date   Pneumococcal Vaccine: up to date   Screening Tests Health Maintenance  Topic Date Due  . TETANUS/TDAP  06/19/2017  . Hepatitis C Screening  09/29/2019 (Originally Mar 11, 1951)  . INFLUENZA VACCINE  05/12/2019  . COLONOSCOPY  04/05/2021  . PNA vac Low Risk Adult  Completed   Cancer Screenings:  Colorectal Screening: Completed 04/05/2016. Repeat every 5 years  Lung Cancer Screening: (Low Dose CT Chest recommended if Age 49-80 years, 30 pack-year currently smoking OR have quit w/in 15years.) does not qualify.     Additional Screening:  Hepatitis C Screening: does qualify, will order for next visit   Vision Screening: Recommended annual ophthalmology exams for early detection of glaucoma and other disorders of the eye. Is the patient up to date with their annual eye exam?  No    Dental Screening: Recommended annual dental exams for proper oral hygiene  Community Resource Referral:  CRR required this visit?  No        Plan:  I have personally reviewed and addressed the Medicare Annual Wellness questionnaire and have noted the following in the patient's chart:  A. Medical and social history B. Use of alcohol, tobacco or illicit drugs  C. Current medications and supplements D. Functional ability and status E.  Nutritional status F.  Physical activity G. Advance directives H. List of other physicians I.  Hospitalizations, surgeries, and ER visits in previous 12 months J.  [place] such as hearing and vision if needed, cognitive and depression L. Referrals and appointments   In addition, I have reviewed and discussed with patient certain preventive protocols, quality metrics, and best practice recommendations. A written personalized care plan for preventive services as well as general preventive health  recommendations were provided to patient.   Signed,   Chrishonda Hesch A,  LPN  [id number]/11/[id number] Nurse Health Advisor   Nurse Notes: none

## 2019-04-26 NOTE — Patient Instructions (Signed)
[name] , Thank you for taking time to come for your Medicare Wellness Visit. I appreciate your ongoing commitment to your health goals. Please review the following plan we discussed and let me know if I can assist you in the future.   Screening recommendations/referrals: Colonoscopy: completed 04/05/2016, due 2022 Recommended yearly ophthalmology/optometry visit for glaucoma screening and checkup Recommended yearly dental visit for hygiene and checkup  Vaccinations: Influenza vaccine: up to date Pneumococcal vaccine: up to date Tdap vaccine: up to date Shingles vaccine: shingrix eligible, check with your pharmacy for insurance company for coverage   Advanced directives: please let us now if you have any questions or need assistance in filling out this paperwork.  Conditions/risks identified: none  Next appointment: Follow up in one year for your annual wellness exam.   Preventive Care 65 Years and Older, Male Preventive care refers to lifestyle choices and visits with your health care provider that can promote health and wellness. What does preventive care include?  A yearly physical exam. This is also called an annual well check.  Dental exams once or twice a year.  Routine eye exams. Ask your health care provider how often you should have your eyes checked.  Personal lifestyle choices, including:  Daily care of your teeth and gums.  Regular physical activity.  Eating a healthy diet.  Avoiding tobacco and drug use.  Limiting alcohol use.  Practicing safe sex.  Taking low doses of aspirin every day.  Taking vitamin and mineral supplements as recommended by your health care provider. What happens during an annual well check? The services and screenings done by your health care provider during your annual well check will depend on your age, overall health, lifestyle risk factors, and family history of disease. Counseling  Your health care provider may ask you  questions about your:  Alcohol use.  Tobacco use.  Drug use.  Emotional well-being.  Home and relationship well-being.  Sexual activity.  Eating habits.  History of falls.  Memory and ability to understand (cognition).  Work and work Statistician. Screening  You may have the following tests or measurements:  Height, weight, and BMI.  Blood pressure.  Lipid and cholesterol levels. These may be checked every 5 years, or more frequently if you are over 15 years old.  Skin check.  Lung cancer screening. You may have this screening every year starting at age 74 if you have a 30-pack-year history of smoking and currently smoke or have quit within the past 15 years.  Fecal occult blood test (FOBT) of the stool. You may have this test every year starting at age 16.  Flexible sigmoidoscopy or colonoscopy. You may have a sigmoidoscopy every 5 years or a colonoscopy every 10 years starting at age 7.  Prostate cancer screening. Recommendations will vary depending on your family history and other risks.  Hepatitis C blood test.  Hepatitis B blood test.  Sexually transmitted disease (STD) testing.  Diabetes screening. This is done by checking your blood sugar (glucose) after you have not eaten for a while (fasting). You may have this done every 1-3 years.  Abdominal aortic aneurysm (AAA) screening. You may need this if you are a current or former smoker.  Osteoporosis. You may be screened starting at age 53 if you are at high risk. Talk with your health care provider about your test results, treatment options, and if necessary, the need for more tests. Vaccines  Your health care provider may recommend certain vaccines, such as:  Influenza vaccine. This is recommended every year.  Tetanus, diphtheria, and acellular pertussis (Tdap, Td) vaccine. You may need a Td booster every 10 years.  Zoster vaccine. You may need this after age 19.  Pneumococcal 13-valent conjugate  (PCV13) vaccine. One dose is recommended after age 33.  Pneumococcal polysaccharide (PPSV23) vaccine. One dose is recommended after age 84. Talk to your health care provider about which screenings and vaccines you need and how often you need them. This information is not intended to replace advice given to you by your health care provider. Make sure you discuss any questions you have with your health care provider. Document Released: 10/24/2015 Document Revised: 06/16/2016 Document Reviewed: 07/29/2015 Elsevier Interactive Patient Education  2017 [employer] Prevention in the Home Falls can cause injuries. They can happen to people of all ages. There are many things you can do to make your home safe and to help prevent falls. What can I do on the outside of my home?  Regularly fix the edges of walkways and driveways and fix any cracks.  Remove anything that might make you trip as you walk through a door, such as a raised step or threshold.  Trim any bushes or trees on the path to your home.  Use bright outdoor lighting.  Clear any walking paths of anything that might make someone trip, such as rocks or tools.  Regularly check to see if handrails are loose or broken. Make sure that both sides of any steps have handrails.  Any raised decks and porches should have guardrails on the edges.  Have any leaves, snow, or ice cleared regularly.  Use sand or salt on walking paths during winter.  Clean up any spills in your garage right away. This includes oil or grease spills. What can I do in the bathroom?  Use night lights.  Install grab bars by the toilet and in the tub and shower. Do not use towel bars as grab bars.  Use non-skid mats or decals in the tub or shower.  If you need to sit down in the shower, use a plastic, non-slip stool.  Keep the floor dry. Clean up any water that spills on the floor as soon as it happens.  Remove soap buildup in the tub or shower  regularly.  Attach bath mats securely with double-sided non-slip rug tape.  Do not have throw rugs and other things on the floor that can make you trip. What can I do in the bedroom?  Use night lights.  Make sure that you have a light by your bed that is easy to reach.  Do not use any sheets or blankets that are too big for your bed. They should not hang down onto the floor.  Have a firm chair that has side arms. You can use this for support while you get dressed.  Do not have throw rugs and other things on the floor that can make you trip. What can I do in the kitchen?  Clean up any spills right away.  Avoid walking on wet floors.  Keep items that you use a lot in easy-to-reach places.  If you need to reach something above you, use a strong step stool that has a grab bar.  Keep electrical cords out of the way.  Do not use floor polish or wax that makes floors slippery. If you must use wax, use non-skid floor wax.  Do not have throw rugs and other things on the floor that  can make you trip. What can I do with my stairs?  Do not leave any items on the stairs.  Make sure that there are handrails on both sides of the stairs and use them. Fix handrails that are broken or loose. Make sure that handrails are as long as the stairways.  Check any carpeting to make sure that it is firmly attached to the stairs. Fix any carpet that is loose or worn.  Avoid having throw rugs at the top or bottom of the stairs. If you do have throw rugs, attach them to the floor with carpet tape.  Make sure that you have a light switch at the top of the stairs and the bottom of the stairs. If you do not have them, ask someone to add them for you. What else can I do to help prevent falls?  Wear shoes that:  Do not have high heels.  Have rubber bottoms.  Are comfortable and fit you well.  Are closed at the toe. Do not wear sandals.  If you use a stepladder:  Make sure that it is fully  opened. Do not climb a closed stepladder.  Make sure that both sides of the stepladder are locked into place.  Ask someone to hold it for you, if possible.  Clearly mark and make sure that you can see:  Any grab bars or handrails.  First and last steps.  Where the edge of each step is.  Use tools that help you move around (mobility aids) if they are needed. These include:  Canes.  Walkers.  Scooters.  Crutches.  Turn on the lights when you go into a dark area. Replace any light bulbs as soon as they burn out.  Set up your furniture so you have a clear path. Avoid moving your furniture around.  If any of your floors are uneven, fix them.  If there are any pets around you, be aware of where they are.  Review your medicines with your doctor. Some medicines can make you feel dizzy. This can increase your chance of falling. Ask your doctor what other things that you can do to help prevent falls. This information is not intended to replace advice given to you by your health care provider. Make sure you discuss any questions you have with your health care provider. Document Released: 07/24/2009 Document Revised: 03/04/2016 Document Reviewed: 11/01/2014 Elsevier Interactive Patient Education  2017 [employer].

## 2019-05-01 ENCOUNTER — Telehealth: Payer: Medicare Other

## 2019-05-07 ENCOUNTER — Other Ambulatory Visit: Payer: Self-pay

## 2019-05-07 ENCOUNTER — Encounter: Payer: Self-pay | Admitting: Nurse Practitioner

## 2019-05-07 ENCOUNTER — Ambulatory Visit (INDEPENDENT_AMBULATORY_CARE_PROVIDER_SITE_OTHER): Payer: Medicare Other | Admitting: Nurse Practitioner

## 2019-05-07 VITALS — BP 125/79 | HR 88 | Temp 98.8°F

## 2019-05-07 DIAGNOSIS — E782 Mixed hyperlipidemia: Secondary | ICD-10-CM | POA: Diagnosis not present

## 2019-05-07 LAB — LIPID PANEL PICCOLO, WAIVED
Chol/HDL Ratio Piccolo,Waive: 3 mg/dL
Cholesterol Piccolo, Waived: 121 mg/dL (ref <200)
HDL Chol Piccolo, Waived: 41 mg/dL — ABNORMAL LOW (ref >59)
LDL Chol Calc Piccolo Waived: 68 mg/dL (ref <100)
Triglycerides Piccolo,Waived: 61 mg/dL (ref <150)
VLDL Chol Calc Piccolo,Waive: 12 mg/dL (ref <30)

## 2019-05-07 NOTE — Assessment & Plan Note (Signed)
Chronic, ongoing.  Continue current medication regimen and adjust dose as needed. CMP and lipid panel today.

## 2019-05-07 NOTE — Progress Notes (Signed)
BP 125/79   Pulse 88 Comment: apical  Temp 98.8 F (37.1 C) (Oral)   SpO2 97%    Subjective:    Patient ID: Jared Herrera, male    DOB: [birth year], 68 y.o.   MRN: [id number]  HPI: Jared Herrera is a 68 y.o. male  Chief Complaint  Patient presents with  . Hyperlipidemia    4 week f/up   HYPERLIPIDEMIA Started on Lipitor 10 MG four weeks ago.  No ADR reported. Hyperlipidemia status: good compliance Satisfied with current treatment?  yes Side effects:  no Medication compliance: good compliance Past cholesterol meds: Atorvastatin Supplements: none Aspirin:  yes The 10-year ASCVD risk score Mikey Bussing DC Jr., et al., 2013) is: 17.5%   Values used to calculate the score:     Age: 74 years     Sex: Male     Is Non-Hispanic African American: Yes     Diabetic: No     Tobacco smoker: No     Systolic Blood Pressure: 197 mmHg     Is BP treated: Yes     HDL Cholesterol: 41 mg/dL     Total Cholesterol: 160 mg/dL Chest pain:  no Coronary artery disease:  no Family history CAD:  no Family history early CAD:  no  Relevant past medical, surgical, family and social history reviewed and updated as indicated. Interim medical history since our last visit reviewed. Allergies and medications reviewed and updated.  Review of Systems  Constitutional: Negative for activity change, diaphoresis, fatigue and fever.  Respiratory: Negative for cough, chest tightness, shortness of breath and wheezing.   Cardiovascular: Negative for chest pain, palpitations and leg swelling.  Gastrointestinal: Negative for abdominal distention, abdominal pain, constipation, diarrhea, nausea and vomiting.  Musculoskeletal: Negative.   Skin: Negative.   Neurological: Negative for dizziness, syncope, weakness, light-headedness, numbness and headaches.  Psychiatric/Behavioral: Negative.     Per HPI unless specifically indicated above     Objective:    BP 125/79   Pulse 88 Comment: apical  Temp 98.8  F (37.1 C) (Oral)   SpO2 97%   Wt Readings from Last 3 Encounters:  04/26/19 190 lb (86.2 kg)  04/03/19 197 lb (89.4 kg)  09/28/18 199 lb 6.4 oz (90.4 kg)    Physical Exam Vitals signs and nursing note reviewed.  Constitutional:      Appearance: He is well-developed.  HENT:     Head: Normocephalic and atraumatic.     Right Ear: Hearing normal. No drainage.     Left Ear: Hearing normal. No drainage.  Eyes:     General: Lids are normal.        Right eye: No discharge.        Left eye: No discharge.     Conjunctiva/sclera: Conjunctivae normal.     Pupils: Pupils are equal, round, and reactive to light.  Neck:     Musculoskeletal: Normal range of motion and neck supple.     Thyroid: No thyromegaly.     Vascular: No carotid bruit.     Trachea: Trachea normal.  Cardiovascular:     Rate and Rhythm: Normal rate and regular rhythm.     Heart sounds: Normal heart sounds, S1 normal and S2 normal. No murmur. No gallop.   Pulmonary:     Effort: Pulmonary effort is normal.     Breath sounds: Normal breath sounds.  Abdominal:     General: Bowel sounds are normal.     Palpations: Abdomen is soft. There  is no hepatomegaly or splenomegaly.  Musculoskeletal: Normal range of motion.     Right lower leg: No edema.     Left lower leg: No edema.  Skin:    General: Skin is warm and dry.     Capillary Refill: Capillary refill takes less than 2 seconds.     Findings: No rash.  Neurological:     Mental Status: He is alert and oriented to person, place, and time.     Deep Tendon Reflexes: Reflexes are normal and symmetric.  Psychiatric:        Mood and Affect: Mood normal.        Behavior: Behavior normal.        Thought Content: Thought content normal.        Judgment: Judgment normal.     Results for orders placed or performed in visit on 04/03/19  Comprehensive metabolic panel  Result Value Ref Range   Glucose 114 (H) 65 - 99 mg/dL   BUN 14 8 - 27 mg/dL   Creatinine, Ser 0.76 0.76  - 1.27 mg/dL   GFR calc non Af Amer 93 >59 mL/min/1.73   GFR calc Af Amer 108 >59 mL/min/1.73   BUN/Creatinine Ratio 18 10 - 24   Sodium 138 134 - 144 mmol/L   Potassium 4.0 3.5 - 5.2 mmol/L   Chloride 97 96 - 106 mmol/L   CO2 27 20 - 29 mmol/L   Calcium 9.3 8.6 - 10.2 mg/dL   Total Protein 7.2 6.0 - 8.5 g/dL   Albumin 4.2 3.8 - 4.8 g/dL   Globulin, Total 3.0 1.5 - 4.5 g/dL   Albumin/Globulin Ratio 1.4 1.2 - 2.2   Bilirubin Total 0.6 0.0 - 1.2 mg/dL   Alkaline Phosphatase 79 39 - 117 IU/L   AST 19 0 - 40 IU/L   ALT 24 0 - 44 IU/L  Lipid Panel w/o Chol/HDL Ratio  Result Value Ref Range   Cholesterol, Total 160 100 - 199 mg/dL   Triglycerides 70 0 - 149 mg/dL   HDL 41 >39 mg/dL   VLDL Cholesterol Cal 14 5 - 40 mg/dL   LDL Calculated 105 (H) 0 - 99 mg/dL  HgB A1c  Result Value Ref Range   Hgb A1c MFr Bld 6.0 (H) 4.8 - 5.6 %   Est. average glucose Bld gHb Est-mCnc 126 mg/dL      Assessment & Plan:   Problem List Items Addressed This Visit      Other   Hyperlipemia - Primary    Chronic, ongoing.  Continue current medication regimen and adjust dose as needed. CMP and lipid panel today.        Relevant Orders   Lipid Panel Piccolo, Waived   Comprehensive metabolic panel       Follow up plan: Return in about 5 months (around 10/07/2019) for HTN/HLD, A-fib.

## 2019-05-07 NOTE — Patient Instructions (Signed)
Fat and Cholesterol Restricted Eating Plan Getting too much fat and cholesterol in your diet may cause health problems. Choosing the right foods helps keep your fat and cholesterol at normal levels. This can keep you from getting certain diseases. Your doctor may recommend an eating plan that includes:  Total fat: ______% or less of total calories a day.  Saturated fat: ______% or less of total calories a day.  Cholesterol: less than _________mg a day.  Fiber: ______g a day. What are tips for following this plan? Meal planning  At meals, divide your plate into four equal parts: ? Fill one-half of your plate with vegetables and green salads. ? Fill one-fourth of your plate with whole grains. ? Fill one-fourth of your plate with low-fat (lean) protein foods.  Eat fish that is high in omega-3 fats at least two times a week. This includes mackerel, tuna, sardines, and salmon.  Eat foods that are high in fiber, such as whole grains, beans, apples, broccoli, carrots, peas, and barley. General tips   Work with your doctor to lose weight if you need to.  Avoid: ? Foods with added sugar. ? Fried foods. ? Foods with partially hydrogenated oils.  Limit alcohol intake to no more than 1 drink a day for nonpregnant women and 2 drinks a day for men. One drink equals 12 oz of beer, 5 oz of wine, or 1 oz of hard liquor. Reading food labels  Check food labels for: ? Trans fats. ? Partially hydrogenated oils. ? Saturated fat (g) in each serving. ? Cholesterol (mg) in each serving. ? Fiber (g) in each serving.  Choose foods with healthy fats, such as: ? Monounsaturated fats. ? Polyunsaturated fats. ? Omega-3 fats.  Choose grain products that have whole grains. Look for the word "whole" as the first word in the ingredient list. Cooking  Cook foods using low-fat methods. These include baking, boiling, grilling, and broiling.  Eat more home-cooked foods. Eat at restaurants and buffets  less often.  Avoid cooking using saturated fats, such as butter, cream, palm oil, palm kernel oil, and coconut oil. Recommended foods  Fruits  All fresh, canned (in natural juice), or frozen fruits. Vegetables  Fresh or frozen vegetables (raw, steamed, roasted, or grilled). Green salads. Grains  Whole grains, such as whole wheat or whole grain breads, crackers, cereals, and pasta. Unsweetened oatmeal, bulgur, barley, quinoa, or brown rice. Corn or whole wheat flour tortillas. Meats and other protein foods  Ground beef (85% or leaner), grass-fed beef, or beef trimmed of fat. Skinless chicken or turkey. Ground chicken or turkey. Pork trimmed of fat. All fish and seafood. Egg whites. Dried beans, peas, or lentils. Unsalted nuts or seeds. Unsalted canned beans. Nut butters without added sugar or oil. Dairy  Low-fat or nonfat dairy products, such as skim or 1% milk, 2% or reduced-fat cheeses, low-fat and fat-free ricotta or cottage cheese, or plain low-fat and nonfat yogurt. Fats and oils  Tub margarine without trans fats. Light or reduced-fat mayonnaise and salad dressings. Avocado. Olive, canola, sesame, or safflower oils. The items listed above may not be a complete list of foods and beverages you can eat. Contact a dietitian for more information. Foods to avoid Fruits  Canned fruit in heavy syrup. Fruit in cream or butter sauce. Fried fruit. Vegetables  Vegetables cooked in cheese, cream, or butter sauce. Fried vegetables. Grains  White bread. White pasta. White rice. Cornbread. Bagels, pastries, and croissants. Crackers and snack foods that contain trans fat   and hydrogenated oils. Meats and other protein foods  Fatty cuts of meat. Ribs, chicken wings, bacon, sausage, bologna, salami, chitterlings, fatback, hot dogs, bratwurst, and packaged lunch meats. Liver and organ meats. Whole eggs and egg yolks. Chicken and turkey with skin. Fried meat. Dairy  Whole or 2% milk, cream,  half-and-half, and cream cheese. Whole milk cheeses. Whole-fat or sweetened yogurt. Full-fat cheeses. Nondairy creamers and whipped toppings. Processed cheese, cheese spreads, and cheese curds. Beverages  Alcohol. Sugar-sweetened drinks such as sodas, lemonade, and fruit drinks. Fats and oils  Butter, stick margarine, lard, shortening, ghee, or bacon fat. Coconut, palm kernel, and palm oils. Sweets and desserts  Corn syrup, sugars, honey, and molasses. Candy. Jam and jelly. Syrup. Sweetened cereals. Cookies, pies, cakes, donuts, muffins, and ice cream. The items listed above may not be a complete list of foods and beverages you should avoid. Contact a dietitian for more information. Summary  Choosing the right foods helps keep your fat and cholesterol at normal levels. This can keep you from getting certain diseases.  At meals, fill one-half of your plate with vegetables and green salads.  Eat high-fiber foods, like whole grains, beans, apples, carrots, peas, and barley.  Limit added sugar, saturated fats, alcohol, and fried foods. This information is not intended to replace advice given to you by your health care provider. Make sure you discuss any questions you have with your health care provider. Document Released: 03/28/2012 Document Revised: 05/31/2018 Document Reviewed: 06/14/2017 Elsevier Patient Education  2020 [employer]  

## 2019-05-08 LAB — COMPREHENSIVE METABOLIC PANEL
ALT: 24 IU/L (ref 0–44)
AST: 24 IU/L (ref 0–40)
Albumin/Globulin Ratio: 1.4 (ref 1.2–2.2)
Albumin: 4.2 g/dL (ref 3.8–4.8)
Alkaline Phosphatase: 82 IU/L (ref 39–117)
BUN/Creatinine Ratio: 22 (ref 10–24)
BUN: 16 mg/dL (ref 8–27)
Bilirubin Total: 0.6 mg/dL (ref 0.0–1.2)
CO2: 25 mmol/L (ref 20–29)
Calcium: 9.4 mg/dL (ref 8.6–10.2)
Chloride: 98 mmol/L (ref 96–106)
Creatinine, Ser: 0.74 mg/dL — ABNORMAL LOW (ref 0.76–1.27)
GFR calc Af Amer: 109 mL/min/{1.73_m2} (ref >59)
GFR calc non Af Amer: 94 mL/min/{1.73_m2} (ref >59)
Globulin, Total: 3.1 g/dL (ref 1.5–4.5)
Glucose: 108 mg/dL — ABNORMAL HIGH (ref 65–99)
Potassium: 3.6 mmol/L (ref 3.5–5.2)
Sodium: 140 mmol/L (ref 134–144)
Total Protein: 7.3 g/dL (ref 6.0–8.5)

## 2019-06-08 ENCOUNTER — Telehealth: Payer: Self-pay

## 2019-07-06 ENCOUNTER — Telehealth: Payer: Self-pay

## 2019-07-09 ENCOUNTER — Ambulatory Visit: Payer: Self-pay | Admitting: Nurse Practitioner

## 2019-07-11 DIAGNOSIS — Z23 Encounter for immunization: Secondary | ICD-10-CM | POA: Diagnosis not present

## 2019-07-19 ENCOUNTER — Other Ambulatory Visit: Payer: Self-pay

## 2019-07-20 ENCOUNTER — Encounter: Payer: Self-pay | Admitting: Radiation Oncology

## 2019-07-20 ENCOUNTER — Other Ambulatory Visit: Payer: Self-pay

## 2019-07-20 ENCOUNTER — Ambulatory Visit
Admission: RE | Admit: 2019-07-20 | Discharge: 2019-07-20 | Disposition: A | Discharge status: Home / Self Care | Payer: Medicare Other | Source: Ambulatory Visit | Attending: Radiation Oncology | Admitting: Radiation Oncology

## 2019-07-20 VITALS — BP 144/71 | HR 91 | Temp 96.6°F | Resp 16 | Wt 194.0 lb

## 2019-07-20 DIAGNOSIS — Z923 Personal history of irradiation: Secondary | ICD-10-CM | POA: Insufficient documentation

## 2019-07-20 DIAGNOSIS — C61 Malignant neoplasm of prostate: Secondary | ICD-10-CM

## 2019-07-20 NOTE — Progress Notes (Signed)
Radiation Oncology Follow up Note  Name: Jared Herrera   Date:   07/20/2019 MRN:  [id number] DOB: 12/15/1950    This 68 y.o. 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  Seen today in routine follow-up he is doing well.  He specifically denies any increased lower urinary tract symptoms diarrhea or fatigue.  His most recent PSA is 0.82.  COMPLICATIONS OF TREATMENT: none  FOLLOW UP COMPLIANCE: keeps appointments   PHYSICAL EXAM:  BP (!) 144/71 (BP Location: Left Arm, Patient Position: Sitting)   Pulse 91   Temp (!) 96.6 F (35.9 C) (Tympanic)   Resp 16   Wt 194 lb (88 kg)   BMI 29.50 kg/m  Well-developed well-nourished patient in NAD. HEENT reveals PERLA, EOMI, discs not visualized.  Oral cavity is clear. No oral mucosal lesions are identified. Neck is clear without evidence of cervical or supraclavicular adenopathy. Lungs are clear to A&P. Cardiac examination is essentially unremarkable with regular rate and rhythm without murmur rub or thrill. Abdomen is benign with no organomegaly or masses noted. Motor sensory and DTR levels are equal and symmetric in the upper and lower extremities. Cranial nerves II through XII are grossly intact. Proprioception is intact. No peripheral adenopathy or edema is identified. No motor or sensory levels are noted. Crude visual fields are within normal range.  RADIOLOGY RESULTS: No current films to review  PLAN: Present time patient is doing well under excellent biochemical control of his prostate cancer.  I am pleased with his overall progress.  I have asked to see him back in 1 year for follow-up with a PSA at that time.   Patient knows to call with any concerns.  I would like to take this opportunity to thank you for allowing me to participate in the care of your patient.Noreene Filbert, MD

## 2019-08-05 ENCOUNTER — Other Ambulatory Visit: Payer: Self-pay | Admitting: Nurse Practitioner

## 2019-08-07 ENCOUNTER — Telehealth: Payer: Self-pay

## 2019-09-05 ENCOUNTER — Other Ambulatory Visit: Payer: Self-pay | Admitting: Nurse Practitioner

## 2019-09-05 ENCOUNTER — Other Ambulatory Visit: Payer: Self-pay

## 2019-10-09 ENCOUNTER — Encounter: Payer: Self-pay | Admitting: Nurse Practitioner

## 2019-10-09 ENCOUNTER — Telehealth: Payer: Self-pay | Admitting: Nurse Practitioner

## 2019-10-09 ENCOUNTER — Other Ambulatory Visit: Payer: Self-pay | Admitting: Nurse Practitioner

## 2019-10-09 ENCOUNTER — Ambulatory Visit (INDEPENDENT_AMBULATORY_CARE_PROVIDER_SITE_OTHER): Payer: Medicare Other | Admitting: Nurse Practitioner

## 2019-10-09 ENCOUNTER — Other Ambulatory Visit: Payer: Self-pay

## 2019-10-09 ENCOUNTER — Ambulatory Visit: Payer: Medicare Other | Admitting: *Deleted

## 2019-10-09 VITALS — BP 114/70 | HR 93 | Temp 98.4°F | Ht 68.4 in | Wt 195.0 lb

## 2019-10-09 DIAGNOSIS — E663 Overweight: Secondary | ICD-10-CM | POA: Diagnosis not present

## 2019-10-09 DIAGNOSIS — I10 Essential (primary) hypertension: Secondary | ICD-10-CM

## 2019-10-09 DIAGNOSIS — R7301 Impaired fasting glucose: Secondary | ICD-10-CM | POA: Diagnosis not present

## 2019-10-09 DIAGNOSIS — E782 Mixed hyperlipidemia: Secondary | ICD-10-CM | POA: Diagnosis not present

## 2019-10-09 MED ORDER — ATORVASTATIN CALCIUM 10 MG PO TABS: 10.0000 mg | ORAL_TABLET | Freq: Every day | ORAL | 3 refills | Status: DC

## 2019-10-09 NOTE — Assessment & Plan Note (Signed)
Chronic, ongoing.  BP below goal.  Recommend checking BP three mornings a week at home and documenting.  Continue current medication regimen and adjust as needed.  Labs today.

## 2019-10-09 NOTE — Telephone Encounter (Signed)
Pt understood and verbalized understanding.  

## 2019-10-09 NOTE — Progress Notes (Signed)
BP 114/70   Pulse 93   Temp 98.4 F (36.9 C) (Oral)   Ht 5' 8.4" (1.737 m)   Wt 195 lb (88.5 kg)   SpO2 97%   BMI 29.30 kg/m    Subjective:    Patient ID: Jared Herrera, male    DOB: 02-03-1951, 68 y.o.   MRN: [id number]  HPI: Jared Herrera is a 68 y.o. male  Chief Complaint  Patient presents with  . Hyperlipidemia  . Hypertension   HYPERTENSION / HYPERLIPIDEMIA Started on Lipitor in June with no ADR.  Continues on this and Zestoretic for HTN. Satisfied with current treatment? yes Duration of hypertension: chronic BP monitoring frequency: not checking BP range:  BP medication side effects: no Duration of hyperlipidemia: chronic Cholesterol medication side effects: no Cholesterol supplements: none Medication compliance: good compliance Aspirin: yes Recent stressors: no Recurrent headaches: no Visual changes: no Palpitations: no Dyspnea: no Chest pain: no Lower extremity edema: no Dizzy/lightheaded: no   IFG: Last A1C 6.0%.  No issues reported.  Has been focused on diet. Polydipsia/polyuria: no Visual disturbance: no Chest pain: no Paresthesias: no  Relevant past medical, surgical, family and social history reviewed and updated as indicated. Interim medical history since our last visit reviewed. Allergies and medications reviewed and updated.  Review of Systems  Constitutional: Negative for activity change, diaphoresis, fatigue and fever.  Respiratory: Negative for cough, chest tightness, shortness of breath and wheezing.   Cardiovascular: Negative for chest pain, palpitations and leg swelling.  Gastrointestinal: Negative.   Endocrine: Negative for cold intolerance, heat intolerance, polydipsia, polyphagia and polyuria.  Neurological: Negative.   Psychiatric/Behavioral: Negative.     Per HPI unless specifically indicated above     Objective:    BP 114/70   Pulse 93   Temp 98.4 F (36.9 C) (Oral)   Ht 5' 8.4" (1.737 m)   Wt 195 lb  (88.5 kg)   SpO2 97%   BMI 29.30 kg/m   Wt Readings from Last 3 Encounters:  10/09/19 195 lb (88.5 kg)  07/20/19 194 lb (88 kg)  04/26/19 190 lb (86.2 kg)    Physical Exam Vitals and nursing note reviewed.  Constitutional:      General: He is awake. He is not in acute distress.    Appearance: He is well-developed and overweight.  HENT:     Head: Normocephalic and atraumatic.     Right Ear: Hearing normal. No drainage.     Left Ear: Hearing normal. No drainage.  Eyes:     General: Lids are normal.        Right eye: No discharge.        Left eye: No discharge.     Conjunctiva/sclera: Conjunctivae normal.     Pupils: Pupils are equal, round, and reactive to light.  Neck:     Vascular: No carotid bruit.  Cardiovascular:     Rate and Rhythm: Normal rate and regular rhythm.     Heart sounds: Normal heart sounds, S1 normal and S2 normal. No murmur. No gallop.   Pulmonary:     Effort: Pulmonary effort is normal. No accessory muscle usage or respiratory distress.     Breath sounds: Normal breath sounds.  Abdominal:     General: Bowel sounds are normal.     Palpations: Abdomen is soft.  Musculoskeletal:        General: Normal range of motion.     Cervical back: Normal range of motion and neck supple.  Right lower leg: No edema.     Left lower leg: No edema.  Skin:    General: Skin is warm and dry.  Neurological:     Mental Status: He is alert and oriented to person, place, and time.  Psychiatric:        Mood and Affect: Mood normal.        Behavior: Behavior normal. Behavior is cooperative.        Thought Content: Thought content normal.        Judgment: Judgment normal.     Results for orders placed or performed in visit on 05/07/19  Lipid Panel Piccolo, [employer]  Result Value Ref Range   Cholesterol Piccolo, Waived 121 <200 mg/dL   HDL Chol Piccolo, Waived 41 (L) >59 mg/dL   Triglycerides Piccolo,Waived 61 <150 mg/dL   Chol/HDL Ratio Piccolo,Waive 3.0 mg/dL   LDL  Chol Calc Piccolo Waived 68 <100 mg/dL   VLDL Chol Calc Piccolo,Waive 12 <30 mg/dL  Comprehensive metabolic panel  Result Value Ref Range   Glucose 108 (H) 65 - 99 mg/dL   BUN 16 8 - 27 mg/dL   Creatinine, Ser 0.74 (L) 0.76 - 1.27 mg/dL   GFR calc non Af Amer 94 >59 mL/min/1.73   GFR calc Af Amer 109 >59 mL/min/1.73   BUN/Creatinine Ratio 22 10 - 24   Sodium 140 134 - 144 mmol/L   Potassium 3.6 3.5 - 5.2 mmol/L   Chloride 98 96 - 106 mmol/L   CO2 25 20 - 29 mmol/L   Calcium 9.4 8.6 - 10.2 mg/dL   Total Protein 7.3 6.0 - 8.5 g/dL   Albumin 4.2 3.8 - 4.8 g/dL   Globulin, Total 3.1 1.5 - 4.5 g/dL   Albumin/Globulin Ratio 1.4 1.2 - 2.2   Bilirubin Total 0.6 0.0 - 1.2 mg/dL   Alkaline Phosphatase 82 39 - 117 IU/L   AST 24 0 - 40 IU/L   ALT 24 0 - 44 IU/L      Assessment & Plan:   Problem List Items Addressed This Visit      Cardiovascular and Mediastinum   Hypertension - Primary    Chronic, ongoing.  BP below goal.  Recommend checking BP three mornings a week at home and documenting.  Continue current medication regimen and adjust as needed.  Labs today.        Relevant Orders   Basic Metabolic Panel (BMET)     Endocrine   IFG (impaired fasting glucose)    Recheck A1C today and adjust plan of care as needed.  Continue diet focus at this time.      Relevant Orders   HgB A1c     Other   Hyperlipemia    Chronic, ongoing.  Continue current medication regimen and adjust dose as needed.  Lipid panel today.        Relevant Orders   Lipid Panel w/o Chol/HDL Ratio out   Overweight    Recommend continued focus on healthy diet choices and regular physical activity (30 minutes 5 days a week).          Follow up plan: Return in about 6 months (around 04/08/2020) for HTN/HLD, IFG.

## 2019-10-09 NOTE — Assessment & Plan Note (Signed)
Chronic, ongoing.  Continue current medication regimen and adjust dose as needed.  Lipid panel today.   

## 2019-10-09 NOTE — Chronic Care Management (AMB) (Signed)
  Chronic Care Management   Telephone Outreach Note  10/09/2019 Name: Jared Herrera MRN: [id number] DOB: Feb 09, 1951  Referred by: health plan.   I reached out to [name] today by phone in response to a referral sent by [name]'s health plan. Jared Herrera and I briefly discussed care management needs related to HTN  SDOH (Social Determinants of Health) screening performed today: None. See Care Plan for related entries.   Outpatient Encounter Medications as of 10/09/2019  Medication Sig  . aspirin 81 MG tablet Take 81 mg by mouth daily. Am, Reported on 11/11/2015  . Cholecalciferol (VITAMIN D3 PO) Take 1,000 Units by mouth daily.  Marland Kitchen lisinopril-hydrochlorothiazide (ZESTORETIC) 20-25 MG tablet Take 1 tablet by mouth daily.  Marland Kitchen loratadine (CLARITIN) 10 MG tablet Take 10 mg by mouth daily. am  . [DISCONTINUED] atorvastatin (LIPITOR) 10 MG tablet Take 1 tablet (10 mg total) by mouth daily.   No facility-administered encounter medications on file as of 10/09/2019.    Goals Addressed   None      Jared Herrera was given information about Chronic Care Management services today including:  1. CCM service includes personalized support from designated clinical staff supervised by his physician, including individualized plan of care and coordination with other care providers 2. 24/7 contact phone numbers for assistance for urgent and routine care needs. 3. Service will only be billed when office clinical staff spend 20 minutes or more in a month to coordinate care. 4. Only one practitioner may furnish and bill the service in a calendar month. 5. The patient may stop CCM services at any time (effective at the end of the month) by phone call to the office staff. 6. The patient will be responsible for cost sharing (co-pay) of up to 20% of the service fee (after annual deductible is met).  Patient did not agree to enrollment in care management services  and does not wish to consider at this time.  No further follow up required: Patient stated he has no CCM needs at this time.   CCM team contact information given to patient if needs arise.    Jared Lick, NP has been notified of this outreach and [name]'s decision and plan.   Jared Morse Joslyn Ramos RN, BSN Nurse Case Editor, [facility]  [phone]) [employer]

## 2019-10-09 NOTE — Telephone Encounter (Signed)
Script sent for 90 days

## 2019-10-09 NOTE — Assessment & Plan Note (Signed)
Recheck A1C today and adjust plan of care as needed.  Continue diet focus at this time.

## 2019-10-09 NOTE — Assessment & Plan Note (Signed)
Recommend continued focus on healthy diet choices and regular physical activity (30 minutes 5 days a week).  

## 2019-10-09 NOTE — Patient Instructions (Signed)

## 2019-10-09 NOTE — Telephone Encounter (Signed)
PT, Is here in office stating he needs refill of atorvastatin (LIPITOR) 10 MG tablet and wants to know if he can have 90 instead of 60 tablets?Please advise.

## 2019-10-10 LAB — BASIC METABOLIC PANEL
BUN/Creatinine Ratio: 20 (ref 10–24)
BUN: 16 mg/dL (ref 8–27)
CO2: 25 mmol/L (ref 20–29)
Calcium: 9.6 mg/dL (ref 8.6–10.2)
Chloride: 100 mmol/L (ref 96–106)
Creatinine, Ser: 0.8 mg/dL (ref 0.76–1.27)
GFR calc Af Amer: 105 mL/min/{1.73_m2} (ref >59)
GFR calc non Af Amer: 91 mL/min/{1.73_m2} (ref >59)
Glucose: 99 mg/dL (ref 65–99)
Potassium: 4.4 mmol/L (ref 3.5–5.2)
Sodium: 136 mmol/L (ref 134–144)

## 2019-10-10 LAB — LIPID PANEL W/O CHOL/HDL RATIO
Cholesterol, Total: 121 mg/dL (ref 100–199)
HDL: 44 mg/dL (ref >39)
LDL Chol Calc (NIH): 65 mg/dL (ref 0–99)
Triglycerides: 56 mg/dL (ref 0–149)
VLDL Cholesterol Cal: 12 mg/dL (ref 5–40)

## 2019-10-10 LAB — HEMOGLOBIN A1C
Est. average glucose Bld gHb Est-mCnc: 117 mg/dL
Hgb A1c MFr Bld: 5.7 % — ABNORMAL HIGH (ref 4.8–5.6)

## 2019-10-10 NOTE — Progress Notes (Signed)
Please let Jared Herrera know some great news.  - A1C, diabetes testing, is trending downwards at 5.7%, was 6.0%.  This is great.  He is almost out of prediabetes category which is 5.7 to 6.4. - Cholesterol numbers look fantastic with LDL below goal!!!! - Kidney and liver function are normal.  GREAT JOB!!!

## 2019-10-30 ENCOUNTER — Ambulatory Visit: Payer: Medicare Other | Attending: Internal Medicine

## 2019-10-30 DIAGNOSIS — Z20822 Contact with and (suspected) exposure to covid-19: Secondary | ICD-10-CM

## 2019-10-31 ENCOUNTER — Telehealth: Payer: Self-pay

## 2019-10-31 LAB — NOVEL CORONAVIRUS, NAA: SARS-CoV-2, NAA: NOT DETECTED

## 2019-10-31 NOTE — Telephone Encounter (Signed)
Patient called in requesting [facility] lab results  - DOB/Address verified - Negative results given; assisted with MyChart setup up, no further questions.

## 2019-12-06 DIAGNOSIS — Z23 Encounter for immunization: Secondary | ICD-10-CM | POA: Diagnosis not present

## 2020-01-08 DIAGNOSIS — Z23 Encounter for immunization: Secondary | ICD-10-CM | POA: Diagnosis not present

## 2020-03-21 ENCOUNTER — Other Ambulatory Visit: Payer: Self-pay | Admitting: Nurse Practitioner

## 2020-04-25 ENCOUNTER — Other Ambulatory Visit: Payer: Self-pay | Admitting: Nurse Practitioner

## 2020-04-25 NOTE — Telephone Encounter (Signed)
Requested medication (s) are due for refill today: yes  Requested medication (s) are on the active medication list: yes  Last refill:  03/21/2020  Future visit scheduled: no  Notes to clinic:  patient has been given a courtesy refill and has not schedule follow up   Requested Prescriptions  Pending Prescriptions Disp Refills   lisinopril-hydrochlorothiazide (ZESTORETIC) 20-25 MG tablet [Pharmacy Med Name: LISINOPRIL-HCTZ 20-25 MG TAB] 30 tablet 0    Sig: Take 1 tablet by mouth daily.      Cardiovascular:  ACEI + Diuretic Combos Failed - 04/25/2020 12:47 PM      Failed - Na in normal range and within 180 days    Sodium  Date Value Ref Range Status  10/09/2019 136 134 - 144 mmol/L Final          Failed - K in normal range and within 180 days    Potassium  Date Value Ref Range Status  10/09/2019 4.4 3.5 - 5.2 mmol/L Final          Failed - Cr in normal range and within 180 days    Creatinine, Ser  Date Value Ref Range Status  10/09/2019 0.80 0.76 - 1.27 mg/dL Final          Failed - Ca in normal range and within 180 days    Calcium  Date Value Ref Range Status  10/09/2019 9.6 8.6 - 10.2 mg/dL Final          Failed - Valid encounter within last 6 months    Recent Outpatient Visits           6 months ago Essential hypertension   [place], Barbaraann Faster, NP   11 months ago Mixed hyperlipidemia   [facility] [place], [place] T, NP   1 year ago Essential hypertension   [place] T, NP   1 year ago Mixed hyperlipidemia   [facility] [place], [place] T, NP   2 years ago Hyperlipidemia, unspecified hyperlipidemia type   [facility] Kathrine Haddock, NP              Passed - Patient is not pregnant      Passed - Last BP in normal range    BP Readings from Last 1 Encounters:  10/09/19 114/70

## 2020-05-22 ENCOUNTER — Other Ambulatory Visit: Payer: Self-pay | Admitting: Nurse Practitioner

## 2020-05-22 NOTE — Telephone Encounter (Signed)
Requested medication (s) are due for refill today: yes  Requested medication (s) are on the active medication list: yes  Last refill:  04/25/2020  Future visit scheduled: yes  Notes to clinic:  patient has appointment on 05/26/2020 but doesn't look like office visit  Review for refill   Requested Prescriptions  Pending Prescriptions Disp Refills   lisinopril-hydrochlorothiazide (ZESTORETIC) 20-25 MG tablet [Pharmacy Med Name: LISINOPRIL-HCTZ 20-25 MG TAB] 30 tablet 0    Sig: Take 1 tablet by mouth daily.      Cardiovascular:  ACEI + Diuretic Combos Failed - 05/22/2020  1:44 PM      Failed - Na in normal range and within 180 days    Sodium  Date Value Ref Range Status  10/09/2019 136 134 - 144 mmol/L Final          Failed - K in normal range and within 180 days    Potassium  Date Value Ref Range Status  10/09/2019 4.4 3.5 - 5.2 mmol/L Final          Failed - Cr in normal range and within 180 days    Creatinine, Ser  Date Value Ref Range Status  10/09/2019 0.80 0.76 - 1.27 mg/dL Final          Failed - Ca in normal range and within 180 days    Calcium  Date Value Ref Range Status  10/09/2019 9.6 8.6 - 10.2 mg/dL Final          Failed - Valid encounter within last 6 months    Recent Outpatient Visits           7 months ago Essential hypertension   [place], Henrine Screws T, NP   1 year ago Mixed hyperlipidemia   [facility] [place], [place] T, NP   1 year ago Essential hypertension   [place], [place] T, NP   1 year ago Mixed hyperlipidemia   [facility] [place], [place] T, NP   2 years ago Hyperlipidemia, unspecified hyperlipidemia type   [facility] Kathrine Haddock, NP       Future Appointments             In 4 days [facility] [place] - Patient is not pregnant      Passed - Last BP in normal range    BP Readings from Last 1 Encounters:   10/09/19 114/70

## 2020-05-23 NOTE — Telephone Encounter (Signed)
LVM TO MAKE THIS F.U APT FOR FUTURE REFILLS

## 2020-05-26 ENCOUNTER — Ambulatory Visit (INDEPENDENT_AMBULATORY_CARE_PROVIDER_SITE_OTHER): Payer: Medicare Other

## 2020-05-26 VITALS — Ht 69.0 in | Wt 190.0 lb

## 2020-05-26 DIAGNOSIS — Z0000 Encounter for general adult medical examination without abnormal findings: Secondary | ICD-10-CM | POA: Diagnosis not present

## 2020-05-26 NOTE — Telephone Encounter (Signed)
LVM TO MAKE THIS F.U APT FOR FUTURE REFILLS

## 2020-05-26 NOTE — Patient Instructions (Signed)
Jared Herrera , Thank you for taking time to come for your Medicare Wellness Visit. I appreciate your ongoing commitment to your health goals. Please review the following plan we discussed and let me know if I can assist you in the future.   Screening recommendations/referrals: Colonoscopy: completed 04/05/2016 Recommended yearly ophthalmology/optometry visit for glaucoma screening and checkup Recommended yearly dental visit for hygiene and checkup  Vaccinations: Influenza vaccine: due Pneumococcal vaccine: completed 01/16/2016 Tdap vaccine: due Shingles vaccine: discussed   Covid-19: will let us know dates  Advanced directives: Advance directive discussed with you today. Even though you declined this today please call our office should you change your mind and we can give you the proper paperwork for you to fill out.   Conditions/risks identified: none  Next appointment: Follow up in one year for your annual wellness visit.   Preventive Care 70 Years and Older, Male Preventive care refers to lifestyle choices and visits with your health care provider that can promote health and wellness. What does preventive care include?  A yearly physical exam. This is also called an annual well check.  Dental exams once or twice a year.  Routine eye exams. Ask your health care provider how often you should have your eyes checked.  Personal lifestyle choices, including:  Daily care of your teeth and gums.  Regular physical activity.  Eating a healthy diet.  Avoiding tobacco and drug use.  Limiting alcohol use.  Practicing safe sex.  Taking low doses of aspirin every day.  Taking vitamin and mineral supplements as recommended by your health care provider. What happens during an annual well check? The services and screenings done by your health care provider during your annual well check will depend on your age, overall health, lifestyle risk factors, and family history of  disease. Counseling  Your health care provider may ask you questions about your:  Alcohol use.  Tobacco use.  Drug use.  Emotional well-being.  Home and relationship well-being.  Sexual activity.  Eating habits.  History of falls.  Memory and ability to understand (cognition).  Work and work Statistician. Screening  You may have the following tests or measurements:  Height, weight, and BMI.  Blood pressure.  Lipid and cholesterol levels. These may be checked every 5 years, or more frequently if you are over 49 years old.  Skin check.  Lung cancer screening. You may have this screening every year starting at age 67 if you have a 30-pack-year history of smoking and currently smoke or have quit within the past 15 years.  Fecal occult blood test (FOBT) of the stool. You may have this test every year starting at age 32.  Flexible sigmoidoscopy or colonoscopy. You may have a sigmoidoscopy every 5 years or a colonoscopy every 10 years starting at age 51.  Prostate cancer screening. Recommendations will vary depending on your family history and other risks.  Hepatitis C blood test.  Hepatitis B blood test.  Sexually transmitted disease (STD) testing.  Diabetes screening. This is done by checking your blood sugar (glucose) after you have not eaten for a while (fasting). You may have this done every 1-3 years.  Abdominal aortic aneurysm (AAA) screening. You may need this if you are a current or former smoker.  Osteoporosis. You may be screened starting at age 71 if you are at high risk. Talk with your health care provider about your test results, treatment options, and if necessary, the need for more tests. Vaccines  Your health  care provider may recommend certain vaccines, such as:  Influenza vaccine. This is recommended every year.  Tetanus, diphtheria, and acellular pertussis (Tdap, Td) vaccine. You may need a Td booster every 10 years.  Zoster vaccine. You may  need this after age 8.  Pneumococcal 13-valent conjugate (PCV13) vaccine. One dose is recommended after age 66.  Pneumococcal polysaccharide (PPSV23) vaccine. One dose is recommended after age 40. Talk to your health care provider about which screenings and vaccines you need and how often you need them. This information is not intended to replace advice given to you by your health care provider. Make sure you discuss any questions you have with your health care provider. Document Released: 10/24/2015 Document Revised: 06/16/2016 Document Reviewed: 07/29/2015 Elsevier Interactive Patient Education  2017 [place] Prevention in the Home Falls can cause injuries. They can happen to people of all ages. There are many things you can do to make your home safe and to help prevent falls. What can I do on the outside of my home?  Regularly fix the edges of walkways and driveways and fix any cracks.  Remove anything that might make you trip as you walk through a door, such as a raised step or threshold.  Trim any bushes or trees on the path to your home.  Use bright outdoor lighting.  Clear any walking paths of anything that might make someone trip, such as rocks or tools.  Regularly check to see if handrails are loose or broken. Make sure that both sides of any steps have handrails.  Any raised decks and porches should have guardrails on the edges.  Have any leaves, snow, or ice cleared regularly.  Use sand or salt on walking paths during winter.  Clean up any spills in your garage right away. This includes oil or grease spills. What can I do in the bathroom?  Use night lights.  Install grab bars by the toilet and in the tub and shower. Do not use towel bars as grab bars.  Use non-skid mats or decals in the tub or shower.  If you need to sit down in the shower, use a plastic, non-slip stool.  Keep the floor dry. Clean up any water that spills on the floor as soon as it  happens.  Remove soap buildup in the tub or shower regularly.  Attach bath mats securely with double-sided non-slip rug tape.  Do not have throw rugs and other things on the floor that can make you trip. What can I do in the bedroom?  Use night lights.  Make sure that you have a light by your bed that is easy to reach.  Do not use any sheets or blankets that are too big for your bed. They should not hang down onto the floor.  Have a firm chair that has side arms. You can use this for support while you get dressed.  Do not have throw rugs and other things on the floor that can make you trip. What can I do in the kitchen?  Clean up any spills right away.  Avoid walking on wet floors.  Keep items that you use a lot in easy-to-reach places.  If you need to reach something above you, use a strong step stool that has a grab bar.  Keep electrical cords out of the way.  Do not use floor polish or wax that makes floors slippery. If you must use wax, use non-skid floor wax.  Do not have  throw rugs and other things on the floor that can make you trip. What can I do with my stairs?  Do not leave any items on the stairs.  Make sure that there are handrails on both sides of the stairs and use them. Fix handrails that are broken or loose. Make sure that handrails are as long as the stairways.  Check any carpeting to make sure that it is firmly attached to the stairs. Fix any carpet that is loose or worn.  Avoid having throw rugs at the top or bottom of the stairs. If you do have throw rugs, attach them to the floor with carpet tape.  Make sure that you have a light switch at the top of the stairs and the bottom of the stairs. If you do not have them, ask someone to add them for you. What else can I do to help prevent falls?  Wear shoes that:  Do not have high heels.  Have rubber bottoms.  Are comfortable and fit you well.  Are closed at the toe. Do not wear sandals.  If you  use a stepladder:  Make sure that it is fully opened. Do not climb a closed stepladder.  Make sure that both sides of the stepladder are locked into place.  Ask someone to hold it for you, if possible.  Clearly mark and make sure that you can see:  Any grab bars or handrails.  First and last steps.  Where the edge of each step is.  Use tools that help you move around (mobility aids) if they are needed. These include:  Canes.  Walkers.  Scooters.  Crutches.  Turn on the lights when you go into a dark area. Replace any light bulbs as soon as they burn out.  Set up your furniture so you have a clear path. Avoid moving your furniture around.  If any of your floors are uneven, fix them.  If there are any pets around you, be aware of where they are.  Review your medicines with your doctor. Some medicines can make you feel dizzy. This can increase your chance of falling. Ask your doctor what other things that you can do to help prevent falls. This information is not intended to replace advice given to you by your health care provider. Make sure you discuss any questions you have with your health care provider. Document Released: 07/24/2009 Document Revised: 03/04/2016 Document Reviewed: 11/01/2014 Elsevier Interactive Patient Education  2017 [employer].

## 2020-05-26 NOTE — Progress Notes (Signed)
I connected with Jared Herrera today by telephone and verified that I am speaking with the correct person using two identifiers. Location patient: home Location provider: work Persons participating in the virtual visit: Jared Herrera, Glenna Durand LPN.   I discussed the limitations, risks, security and privacy concerns of performing an evaluation and management service by telephone and the availability of in person appointments. I also discussed with the patient that there may be a patient responsible charge related to this service. The patient expressed understanding and verbally consented to this telephonic visit.    Interactive audio and video telecommunications were attempted between this provider and patient, however failed, due to patient having technical difficulties OR patient did not have access to video capability.  We continued and completed visit with audio only.    Vital signs may be patient reported or missing.   Subjective:   DEANGLO Herrera is a 69 y.o. male who presents for Medicare Annual/Subsequent preventive examination.  Review of Systems     Cardiac Risk Factors include: advanced age (>51men, >60 women);dyslipidemia;hypertension;male gender     Objective:    Today's Vitals   05/26/20 1026  Weight: 190 lb (86.2 kg)  Height: 5\' 9"  (1.753 m)   Body mass index is 28.06 kg/m.  Advanced Directives 05/26/2020 07/20/2019 04/26/2019 07/07/2018 07/04/2018 01/20/2018 12/29/2017  Does Patient Have a Medical Advance Directive? No No No No No No No  Would patient like information on creating a medical advance directive? - No - Patient declined - No - Patient declined No - Patient declined Yes (MAU/Ambulatory/Procedural Areas - Information given) No - Patient declined    Current Medications (verified) Outpatient Encounter Medications as of 05/26/2020  Medication Sig  . aspirin 81 MG tablet Take 81 mg by mouth daily. Am, Reported on 11/11/2015  . atorvastatin  (LIPITOR) 10 MG tablet Take 1 tablet (10 mg total) by mouth daily.  . Cholecalciferol (VITAMIN D3 PO) Take 1,000 Units by mouth daily.  Marland Kitchen lisinopril-hydrochlorothiazide (ZESTORETIC) 20-25 MG tablet Take 1 tablet by mouth daily. Need follow-up for further refills.  Marland Kitchen loratadine (CLARITIN) 10 MG tablet Take 10 mg by mouth daily. am  . Multiple Vitamin (MULTIVITAMIN) tablet Take 1 tablet by mouth daily.   No facility-administered encounter medications on file as of 05/26/2020.    Allergies (verified) Patient has no known allergies.   History: Past Medical History:  Diagnosis Date  . Arthritis   . Cancer [facility])    Prostate  . Cataract    bilateral  . GERD (gastroesophageal reflux disease)   . Hypertension    controlled on meds  . Sinus drainage    Past Surgical History:  Procedure Laterality Date  . COLONOSCOPY WITH PROPOFOL N/A 04/05/2016   Procedure: COLONOSCOPY WITH PROPOFOL;  Surgeon: Lucilla Lame, MD;  Location: [place];  Service: Endoscopy;  Laterality: N/A;  . PROSTATE BIOPSY  March 2017   showed some cells, but they are slow growing  . RADIOACTIVE SEED IMPLANT N/A 02/01/2017   Procedure: RADIOACTIVE SEED IMPLANT/BRACHYTHERAPY IMPLANT;  Surgeon: Hollice Espy, MD;  Location: [facility];  Service: Urology;  Laterality: N/A;  . TONSILLECTOMY AND ADENOIDECTOMY    . WISDOM TOOTH EXTRACTION     Family History  Problem Relation Age of Onset  . Heart disease Brother   . Hypertension Brother   . Hypertension Mother   . Kidney disease Mother   . Cancer Sister        thyroid  . Lupus Daughter   .  Kidney disease Daughter   . COPD Neg Hx   . Diabetes Neg Hx   . Stroke Neg Hx    Social History   Socioeconomic History  . Marital status: Legally Separated    Spouse name: Not on file  . Number of children: Not on file  . Years of education: Not on file  . Highest education level: 10th grade  Occupational History  . Occupation: retired  Tobacco Use  . Smoking  status: Former Smoker    Packs/day: 0.50    Years: 8.00    Pack years: 4.00    Types: Cigarettes    Quit date: 10/11/1978    Years since quitting: 41.6  . Smokeless tobacco: Never Used  Vaping Use  . Vaping Use: Never used  Substance and Sexual Activity  . Alcohol use: Not Currently    Alcohol/week: 0.0 standard drinks    Comment: occasional glass of wine  . Drug use: No  . Sexual activity: Not Currently  Other Topics Concern  . Not on file  Social History Narrative  . Not on file   Social Determinants of Health   Financial Resource Strain: Low Risk   . Difficulty of Paying Living Expenses: Not hard at all  Food Insecurity: No Food Insecurity  . Worried About Charity fundraiser in the Last Year: Never true  . Ran Out of Food in the Last Year: Never true  Transportation Needs: No Transportation Needs  . Lack of Transportation (Medical): No  . Lack of Transportation (Non-Medical): No  Physical Activity: Sufficiently Active  . Days of Exercise per Week: 3 days  . Minutes of Exercise per Session: 150+ min  Stress: No Stress Concern Present  . Feeling of Stress : Not at all  Social Connections:   . Frequency of Communication with Friends and Family:   . Frequency of Social Gatherings with Friends and Family:   . Attends Religious Services:   . Active Member of Clubs or Organizations:   . Attends Archivist Meetings:   Marland Kitchen Marital Status:     Tobacco Counseling Counseling given: Not Answered   Clinical Intake:  Pre-visit preparation completed: Yes  Pain : No/denies pain     Nutritional Status: BMI 25 -29 Overweight Nutritional Risks: None Diabetes: No  How often do you need to have someone help you when you read instructions, pamphlets, or other written materials from your doctor or pharmacy?: 1 - Never What is the last grade level you completed in school?: 10th grade  Diabetic? no  Interpreter Needed?: No  Information entered by :: NAllen  LPN   Activities of Daily Living In your present state of health, do you have any difficulty performing the following activities: 05/26/2020  Hearing? N  Vision? N  Difficulty concentrating or making decisions? N  Walking or climbing stairs? N  Dressing or bathing? N  Doing errands, shopping? N  Preparing Food and eating ? N  Using the Toilet? N  In the past six months, have you accidently leaked urine? N  Do you have problems with loss of bowel control? N  Managing your Medications? N  Managing your Finances? N  Housekeeping or managing your Housekeeping? N  Some recent data might be hidden    Patient Care Team: Venita Lick, NP as PCP - General (Nurse Practitioner) Hollice Espy, MD as Consulting Physician (Urology) Minor, Dalbert Garnet, RN (Inactive) as [place] any recent Medical Services you  may have received from other than [facility] providers in the past year (date may be approximate).     Assessment:   This is a routine wellness examination for [place].  Hearing/Vision screen  Hearing Screening   125Hz  250Hz  500Hz  1000Hz  2000Hz  3000Hz  4000Hz  6000Hz  8000Hz   Right ear:           Left ear:           Vision Screening Comments: No regular eye exams  Dietary issues and exercise activities discussed: Current Exercise Habits: Home exercise routine, Type of exercise: Other - see comments (mows lawns), Time (Minutes): > 60, Frequency (Times/Week): 3, Weekly Exercise (Minutes/Week): 0  Goals    . DIET - INCREASE WATER INTAKE     Recommend continue drinking at least 6-8 glasses of water a day     . Patient Stated     05/26/2020, wants to weigh 180 pounds      Depression Screen PHQ 2/9 Scores 05/26/2020 04/26/2019 01/20/2018 06/29/2017 03/01/2017 01/18/2017 01/18/2017  PHQ - 2 Score 0 0 1 0 0 0 0  PHQ- 9 Score 0 - 4 - - 1 1    Fall Risk Fall Risk  05/26/2020 09/05/2019 04/26/2019 01/20/2018 06/29/2017  Falls in the past year? 0 0 0 No No   Comment - Emmi Telephone Survey: data to providers prior to load - - -    Any stairs in or around the home? No  If so, are there any without handrails? n/a Home free of loose throw rugs in walkways, pet beds, electrical cords, etc? Yes  Adequate lighting in your home to reduce risk of falls? Yes   ASSISTIVE DEVICES UTILIZED TO PREVENT FALLS:  Life alert? No  Use of a cane, walker or w/c? No  Grab bars in the bathroom? No  Shower chair or bench in shower? No  Elevated toilet seat or a handicapped toilet? No   TIMED UP AND GO:  Was the test performed? No .     Cognitive Function:     6CIT Screen 05/26/2020 04/26/2019 01/20/2018  What Year? 0 points 0 points 0 points  What month? 0 points 0 points 0 points  What time? 0 points 0 points 0 points  Count back from 20 0 points 0 points 0 points  Months in reverse 0 points 0 points 0 points  Repeat phrase 2 points 0 points 2 points  Total Score 2 0 2    Immunizations Immunization History  Administered Date(s) Administered  . Influenza, High Dose Seasonal PF 08/20/2016, 07/28/2018  . Influenza,inj,Quad PF,6+ Mos 10/10/2015  . Influenza-Unspecified 08/25/2018, 07/12/2019  . Pneumococcal Conjugate-13 01/15/2015  . Pneumococcal Polysaccharide-23 01/16/2016  . Td 06/20/2007  . Zoster 01/11/2014    TDAP status: Due, Education has been provided regarding the importance of this vaccine. Advised may receive this vaccine at local pharmacy or Health Dept. Aware to provide a copy of the vaccination record if obtained from local pharmacy or Health Dept. Verbalized acceptance and understanding. Flu Vaccine status: Up to date Pneumococcal vaccine status: Up to date Covid-19 vaccine status: Completed vaccines  Qualifies for Shingles Vaccine? Yes   Zostavax completed Yes   Shingrix Completed?: No.    Education has been provided regarding the importance of this vaccine. Patient has been advised to call insurance company to determine out of  pocket expense if they have not yet received this vaccine. Advised may also receive vaccine at local pharmacy or Health Dept. Verbalized acceptance and understanding.  Screening  Tests Health Maintenance  Topic Date Due  . Hepatitis C Screening  Never done  . COVID-19 Vaccine (1) Never done  . INFLUENZA VACCINE  05/11/2020  . TETANUS/TDAP  10/08/2020 (Originally 06/19/2017)  . COLONOSCOPY  04/05/2021  . PNA vac Low Risk Adult  Completed    Health Maintenance  Health Maintenance Due  Topic Date Due  . Hepatitis C Screening  Never done  . COVID-19 Vaccine (1) Never done  . INFLUENZA VACCINE  05/11/2020    Colorectal cancer screening: Completed 04/05/2016. Repeat every 10 years  Lung Cancer Screening: (Low Dose CT Chest recommended if Age 75-80 years, 30 pack-year currently smoking OR have quit w/in 15years.) does not qualify.   Lung Cancer Screening Referral: no  Additional Screening:  Hepatitis C Screening: does qualify;  Vision Screening: Recommended annual ophthalmology exams for early detection of glaucoma and other disorders of the eye. Is the patient up to date with their annual eye exam?  No  Who is the provider or what is the name of the office in which the patient attends annual eye exams? none If pt is not established with a provider, would they like to be referred to a provider to establish care? No .   Dental Screening: Recommended annual dental exams for proper oral hygiene  Community Resource Referral / Chronic Care Management: CRR required this visit?  No   CCM required this visit?  No      Plan:     I have personally reviewed and noted the following in the patient's chart:   . Medical and social history . Use of alcohol, tobacco or illicit drugs  . Current medications and supplements . Functional ability and status . Nutritional status . Physical activity . Advanced directives . List of other physicians . Hospitalizations, surgeries, and ER visits  in previous 12 months . Vitals . Screenings to include cognitive, depression, and falls . Referrals and appointments  In addition, I have reviewed and discussed with patient certain preventive protocols, quality metrics, and best practice recommendations. A written personalized care plan for preventive services as well as general preventive health recommendations were provided to patient.     Kellie Simmering, LPN   6/60/[id number]   Nurse Notes:

## 2020-05-27 NOTE — Telephone Encounter (Signed)
I went ahead and sent mychart message too.

## 2020-05-27 NOTE — Telephone Encounter (Signed)
3rd attempt made to reach out to patient to make this apt for future refills ,LVM.

## 2020-06-19 ENCOUNTER — Other Ambulatory Visit: Payer: Self-pay | Admitting: Nurse Practitioner

## 2020-06-27 ENCOUNTER — Ambulatory Visit: Payer: Self-pay | Admitting: Nurse Practitioner

## 2020-07-04 ENCOUNTER — Other Ambulatory Visit: Payer: Self-pay

## 2020-07-04 ENCOUNTER — Ambulatory Visit (INDEPENDENT_AMBULATORY_CARE_PROVIDER_SITE_OTHER): Payer: Medicare Other | Admitting: Family Medicine

## 2020-07-04 ENCOUNTER — Encounter: Payer: Self-pay | Admitting: Family Medicine

## 2020-07-04 VITALS — BP 121/79 | HR 106 | Temp 98.1°F | Wt 201.0 lb

## 2020-07-04 DIAGNOSIS — Z23 Encounter for immunization: Secondary | ICD-10-CM | POA: Diagnosis not present

## 2020-07-04 DIAGNOSIS — E782 Mixed hyperlipidemia: Secondary | ICD-10-CM

## 2020-07-04 DIAGNOSIS — R7301 Impaired fasting glucose: Secondary | ICD-10-CM

## 2020-07-04 DIAGNOSIS — Z1159 Encounter for screening for other viral diseases: Secondary | ICD-10-CM | POA: Diagnosis not present

## 2020-07-04 DIAGNOSIS — I10 Essential (primary) hypertension: Secondary | ICD-10-CM

## 2020-07-04 MED ORDER — LISINOPRIL-HYDROCHLOROTHIAZIDE 20-25 MG PO TABS
1.0000 | ORAL_TABLET | Freq: Every day | ORAL | 1 refills | Status: DC
Start: 2020-07-04 — End: 2021-02-02

## 2020-07-04 MED ORDER — ATORVASTATIN CALCIUM 10 MG PO TABS
10.0000 mg | ORAL_TABLET | Freq: Every day | ORAL | 1 refills | Status: DC
Start: 2020-07-04 — End: 2021-02-18

## 2020-07-04 NOTE — Assessment & Plan Note (Signed)
Rechecking labs today. Await results.  

## 2020-07-04 NOTE — Progress Notes (Signed)
BP 121/79   Pulse (!) 106   Temp 98.1 F (36.7 C) (Oral)   Wt 201 lb (91.2 kg)   SpO2 94%   BMI 29.68 kg/m    Subjective:    Patient ID: Jared Herrera, male    DOB: 08-12-1951, 69 y.o.   MRN: [id number]  HPI: Jared Herrera is a 69 y.o. male  Chief Complaint  Patient presents with  . Hypertension  . Hyperlipidemia  . IFG   HYPERTENSION / HYPERLIPIDEMIA Satisfied with current treatment? yes Duration of hypertension: chronic BP monitoring frequency: not checking BP medication side effects: no Past BP meds: lisinopril, HCTZ Duration of hyperlipidemia: chronic Cholesterol medication side effects: no Cholesterol supplements: none Past cholesterol medications: atorvastatin Medication compliance: excellent compliance Aspirin: no Recent stressors: no Recurrent headaches: no Visual changes: no Palpitations: no Dyspnea: no Chest pain: no Lower extremity edema: no Dizzy/lightheaded: no  Impaired Fasting Glucose HbA1C:  Lab Results  Component Value Date   HGBA1C 5.7 (H) 10/09/2019   Duration of elevated blood sugar: chronic Polydipsia: no Polyuria: no Weight change: no Visual disturbance: no Glucose Monitoring: no    Accucheck frequency: Not Checking Diabetic Education: Completed Family history of diabetes: no  Relevant past medical, surgical, family and social history reviewed and updated as indicated. Interim medical history since our last visit reviewed. Allergies and medications reviewed and updated.  Review of Systems  Respiratory: Negative.   Cardiovascular: Negative.   Gastrointestinal: Negative.   Musculoskeletal: Negative.   Psychiatric/Behavioral: Negative.     Per HPI unless specifically indicated above     Objective:    BP 121/79   Pulse (!) 106   Temp 98.1 F (36.7 C) (Oral)   Wt 201 lb (91.2 kg)   SpO2 94%   BMI 29.68 kg/m   Wt Readings from Last 3 Encounters:  07/04/20 201 lb (91.2 kg)  05/26/20 190 lb (86.2 kg)    10/09/19 195 lb (88.5 kg)    Physical Exam Vitals and nursing note reviewed.  Constitutional:      General: He is not in acute distress.    Appearance: Normal appearance. He is not ill-appearing, toxic-appearing or diaphoretic.  HENT:     Head: Normocephalic and atraumatic.     Right Ear: External ear normal.     Left Ear: External ear normal.     Nose: Nose normal.     Mouth/Throat:     Mouth: Mucous membranes are moist.     Pharynx: Oropharynx is clear.  Eyes:     General: No scleral icterus.       Right eye: No discharge.        Left eye: No discharge.     Extraocular Movements: Extraocular movements intact.     Conjunctiva/sclera: Conjunctivae normal.     Pupils: Pupils are equal, round, and reactive to light.  Cardiovascular:     Rate and Rhythm: Normal rate and regular rhythm.     Pulses: Normal pulses.     Heart sounds: Normal heart sounds. No murmur heard.  No friction rub. No gallop.   Pulmonary:     Effort: Pulmonary effort is normal. No respiratory distress.     Breath sounds: Normal breath sounds. No stridor. No wheezing, rhonchi or rales.  Chest:     Chest wall: No tenderness.  Musculoskeletal:        General: Normal range of motion.     Cervical back: Normal range of motion and neck supple.  Skin:  General: Skin is warm and dry.     Capillary Refill: Capillary refill takes less than 2 seconds.     Coloration: Skin is not jaundiced or pale.     Findings: No bruising, erythema, lesion or rash.  Neurological:     General: No focal deficit present.     Mental Status: He is alert and oriented to person, place, and time. Mental status is at baseline.  Psychiatric:        Mood and Affect: Mood normal.        Behavior: Behavior normal.        Thought Content: Thought content normal.        Judgment: Judgment normal.     Results for orders placed or performed in visit on 10/30/19  Novel Coronavirus, NAA ([facility])   Specimen: Nasopharyngeal(NP) swabs in  vial transport medium   NASOPHARYNGE  TESTING  Result Value Ref Range   SARS-CoV-2, NAA Not Detected Not Detected      Assessment & Plan:   Problem List Items Addressed This Visit      Cardiovascular and Mediastinum   Hypertension    Under good control on current regimen. Continue current regimen. Continue to monitor. Call with any concerns. Refills given. Labs drawn today.       Relevant Medications   lisinopril-hydrochlorothiazide (ZESTORETIC) 20-25 MG tablet   atorvastatin (LIPITOR) 10 MG tablet   Other Relevant Orders   Comprehensive metabolic panel     Endocrine   IFG (impaired fasting glucose) - Primary    Rechecking labs today. Await results.       Relevant Orders   Comprehensive metabolic panel   Bayer DCA Hb A1c Waived     Other   Hyperlipemia    Under good control on current regimen. Continue current regimen. Continue to monitor. Call with any concerns. Refills given. Labs drawn today.       Relevant Medications   lisinopril-hydrochlorothiazide (ZESTORETIC) 20-25 MG tablet   atorvastatin (LIPITOR) 10 MG tablet   Other Relevant Orders   Comprehensive metabolic panel   Lipid Panel w/o Chol/HDL Ratio    Other Visit Diagnoses    Need for hepatitis C screening test       Labs drawn today. Await results.    Relevant Orders   Hepatitis C antibody   Flu vaccine need       Flu shot given today.   Relevant Orders   Flu Vaccine QUAD High Dose(Fluad) (Completed)       Follow up plan: Return in about 6 months (around 01/01/2021) for Follow up.

## 2020-07-04 NOTE — Assessment & Plan Note (Signed)
Under good control on current regimen. Continue current regimen. Continue to monitor. Call with any concerns. Refills given. Labs drawn today.   

## 2020-07-05 LAB — HEPATITIS C ANTIBODY: Hep C Virus Ab: <0.1 s/co ratio (ref 0.0–0.9)

## 2020-07-09 ENCOUNTER — Other Ambulatory Visit: Payer: Self-pay

## 2020-07-09 ENCOUNTER — Other Ambulatory Visit: Payer: Medicare Other

## 2020-07-09 DIAGNOSIS — C61 Malignant neoplasm of prostate: Secondary | ICD-10-CM

## 2020-07-09 DIAGNOSIS — R7301 Impaired fasting glucose: Secondary | ICD-10-CM | POA: Diagnosis not present

## 2020-07-09 DIAGNOSIS — E782 Mixed hyperlipidemia: Secondary | ICD-10-CM | POA: Diagnosis not present

## 2020-07-09 DIAGNOSIS — I10 Essential (primary) hypertension: Secondary | ICD-10-CM | POA: Diagnosis not present

## 2020-07-09 LAB — BAYER DCA HB A1C WAIVED: HB A1C (BAYER DCA - WAIVED): 5.8 % (ref <7.0)

## 2020-07-10 LAB — COMPREHENSIVE METABOLIC PANEL
ALT: 25 IU/L (ref 0–44)
AST: 18 IU/L (ref 0–40)
Albumin/Globulin Ratio: 1.3 (ref 1.2–2.2)
Albumin: 4.1 g/dL (ref 3.8–4.8)
Alkaline Phosphatase: 80 IU/L (ref 44–121)
BUN/Creatinine Ratio: 28 — ABNORMAL HIGH (ref 10–24)
BUN: 19 mg/dL (ref 8–27)
Bilirubin Total: 0.6 mg/dL (ref 0.0–1.2)
CO2: 23 mmol/L (ref 20–29)
Calcium: 9.3 mg/dL (ref 8.6–10.2)
Chloride: 100 mmol/L (ref 96–106)
Creatinine, Ser: 0.68 mg/dL — ABNORMAL LOW (ref 0.76–1.27)
GFR calc Af Amer: 112 mL/min/{1.73_m2} (ref >59)
GFR calc non Af Amer: 97 mL/min/{1.73_m2} (ref >59)
Globulin, Total: 3.1 g/dL (ref 1.5–4.5)
Glucose: 106 mg/dL — ABNORMAL HIGH (ref 65–99)
Potassium: 4.4 mmol/L (ref 3.5–5.2)
Sodium: 137 mmol/L (ref 134–144)
Total Protein: 7.2 g/dL (ref 6.0–8.5)

## 2020-07-10 LAB — LIPID PANEL W/O CHOL/HDL RATIO
Cholesterol, Total: 114 mg/dL (ref 100–199)
HDL: 41 mg/dL
LDL Chol Calc (NIH): 61 mg/dL (ref 0–99)
Triglycerides: 54 mg/dL (ref 0–149)
VLDL Cholesterol Cal: 12 mg/dL (ref 5–40)

## 2020-07-11 ENCOUNTER — Other Ambulatory Visit: Payer: Self-pay

## 2020-07-11 ENCOUNTER — Inpatient Hospital Stay: Payer: Medicare Other | Attending: Radiation Oncology

## 2020-07-11 DIAGNOSIS — C61 Malignant neoplasm of prostate: Secondary | ICD-10-CM | POA: Insufficient documentation

## 2020-07-11 LAB — PSA: Prostatic Specific Antigen: 0.1 ng/mL (ref 0.00–4.00)

## 2020-07-17 ENCOUNTER — Encounter: Payer: Self-pay | Admitting: Radiation Oncology

## 2020-07-18 ENCOUNTER — Ambulatory Visit
Admission: RE | Admit: 2020-07-18 | Discharge: 2020-07-18 | Disposition: A | Discharge status: Home / Self Care | Payer: Medicare Other | Source: Ambulatory Visit | Attending: Radiation Oncology | Admitting: Radiation Oncology

## 2020-07-18 ENCOUNTER — Other Ambulatory Visit: Payer: Self-pay

## 2020-07-18 ENCOUNTER — Encounter: Payer: Self-pay | Admitting: Radiation Oncology

## 2020-07-18 VITALS — BP 130/78 | HR 82 | Temp 96.5°F | Wt 201.0 lb

## 2020-07-18 DIAGNOSIS — C61 Malignant neoplasm of prostate: Secondary | ICD-10-CM | POA: Insufficient documentation

## 2020-07-18 DIAGNOSIS — Z923 Personal history of irradiation: Secondary | ICD-10-CM | POA: Insufficient documentation

## 2020-07-18 NOTE — Progress Notes (Signed)
Radiation Oncology Follow up Note  Name: Jared Herrera   Date:   07/18/2020 MRN:  [id number] DOB: October 09, 1951    This 69 y.o. male presents to the clinic today for 3-1/2-year follow-up status post I-125 interstitial implant for Gleason 6 adenocarcinoma the prostate.  REFERRING PROVIDER: Venita Lick, NP  HPI: Patient is a 69 year old male now out 3-1/2 years having completed I-125 interstitial implant for Gleason 6 (3+3) adenocarcinoma the prostate presenting with a PSA of 6.2.  He is seen today in routine follow-up and is doing well.  His PSA continues to track downwards most recently was 0.106 months prior it was 0.8.Marland Kitchen  COMPLICATIONS OF TREATMENT: none  FOLLOW UP COMPLIANCE: keeps appointments   PHYSICAL EXAM:  BP 130/78   Pulse 82   Temp (!) 96.5 F (35.8 C) (Tympanic)   Wt 201 lb (91.2 kg)   BMI 29.68 kg/m  Well-developed well-nourished patient in NAD. HEENT reveals PERLA, EOMI, discs not visualized.  Oral cavity is clear. No oral mucosal lesions are identified. Neck is clear without evidence of cervical or supraclavicular adenopathy. Lungs are clear to A&P. Cardiac examination is essentially unremarkable with regular rate and rhythm without murmur rub or thrill. Abdomen is benign with no organomegaly or masses noted. Motor sensory and DTR levels are equal and symmetric in the upper and lower extremities. Cranial nerves II through XII are grossly intact. Proprioception is intact. No peripheral adenopathy or edema is identified. No motor or sensory levels are noted. Crude visual fields are within normal range.  RADIOLOGY RESULTS: No current films to review  PLAN: Present time patient is doing well under excellent biochemical control of his prostate cancer and pleased with his overall progress.  I will see him 1 more time in a year for follow-up and if his PSA remains low we will refer him back to family care to allow them to perform yearly PSAs.  Patient comprehends my  recommendations well.  I would like to take this opportunity to thank you for allowing me to participate in the care of your patient.Noreene Filbert, MD

## 2020-08-11 DIAGNOSIS — Z23 Encounter for immunization: Secondary | ICD-10-CM | POA: Diagnosis not present

## 2021-01-28 ENCOUNTER — Other Ambulatory Visit: Payer: Self-pay | Admitting: Family Medicine

## 2021-01-28 NOTE — Telephone Encounter (Signed)
Lvm pt needs apt for this refill.

## 2021-01-28 NOTE — Telephone Encounter (Signed)
Requested medication (s) are due for refill today: yes   Requested medication (s) are on the active medication list: yes   Last refill: 10/20/2020  Future visit scheduled: no  Notes to clinic:  overdue for follow up appt  Vm left for patient to call and schedule    Requested Prescriptions  Pending Prescriptions Disp Refills   lisinopril-hydrochlorothiazide (ZESTORETIC) 20-25 MG tablet [Pharmacy Med Name: LISINOPRIL-HCTZ 20-25 MG TAB] 90 tablet 0    Sig: Take 1 tablet by mouth daily.      Cardiovascular:  ACEI + Diuretic Combos Failed - 01/28/2021 12:46 PM      Failed - Na in normal range and within 180 days    Sodium  Date Value Ref Range Status  07/09/2020 137 134 - 144 mmol/L Final          Failed - K in normal range and within 180 days    Potassium  Date Value Ref Range Status  07/09/2020 4.4 3.5 - 5.2 mmol/L Final          Failed - Cr in normal range and within 180 days    Creatinine, Ser  Date Value Ref Range Status  07/09/2020 0.68 (L) 0.76 - 1.27 mg/dL Final          Failed - Ca in normal range and within 180 days    Calcium  Date Value Ref Range Status  07/09/2020 9.3 8.6 - 10.2 mg/dL Final          Failed - Valid encounter within last 6 months    Recent Outpatient Visits           6 months ago IFG (impaired fasting glucose)   [facility] [place], [place] P, DO   1 year ago Essential hypertension   [place], Henrine Screws T, NP   1 year ago Mixed hyperlipidemia   [place], Barbaraann Faster, NP   1 year ago Essential hypertension   [place], [place] T, NP   2 years ago Mixed hyperlipidemia   [place], [place] T, NP       Future Appointments             In 3 months [place], [place] - Patient is not pregnant      Passed - Last BP in normal range    BP Readings from Last 1 Encounters:  07/18/20 130/78

## 2021-02-02 NOTE — Telephone Encounter (Signed)
Routing to provider  

## 2021-02-02 NOTE — Telephone Encounter (Signed)
Pt scheduled for 02/18/2021

## 2021-02-09 DIAGNOSIS — Z23 Encounter for immunization: Secondary | ICD-10-CM | POA: Diagnosis not present

## 2021-02-14 ENCOUNTER — Encounter: Payer: Self-pay | Admitting: Nurse Practitioner

## 2021-02-18 ENCOUNTER — Encounter: Payer: Self-pay | Admitting: Nurse Practitioner

## 2021-02-18 ENCOUNTER — Ambulatory Visit (INDEPENDENT_AMBULATORY_CARE_PROVIDER_SITE_OTHER): Payer: Medicare Other | Admitting: Nurse Practitioner

## 2021-02-18 ENCOUNTER — Other Ambulatory Visit: Payer: Self-pay

## 2021-02-18 VITALS — BP 111/78 | HR 92 | Temp 98.4°F | Wt 194.6 lb

## 2021-02-18 DIAGNOSIS — R7301 Impaired fasting glucose: Secondary | ICD-10-CM

## 2021-02-18 DIAGNOSIS — Z1211 Encounter for screening for malignant neoplasm of colon: Secondary | ICD-10-CM

## 2021-02-18 DIAGNOSIS — E663 Overweight: Secondary | ICD-10-CM | POA: Diagnosis not present

## 2021-02-18 DIAGNOSIS — Z8546 Personal history of malignant neoplasm of prostate: Secondary | ICD-10-CM | POA: Diagnosis not present

## 2021-02-18 DIAGNOSIS — I10 Essential (primary) hypertension: Secondary | ICD-10-CM | POA: Diagnosis not present

## 2021-02-18 DIAGNOSIS — E782 Mixed hyperlipidemia: Secondary | ICD-10-CM | POA: Diagnosis not present

## 2021-02-18 MED ORDER — ATORVASTATIN CALCIUM 10 MG PO TABS
10.0000 mg | ORAL_TABLET | Freq: Every day | ORAL | 4 refills | Status: DC
Start: 2021-02-18 — End: 2022-02-18

## 2021-02-18 NOTE — Progress Notes (Signed)
BP 111/78   Pulse 92   Temp 98.4 F (36.9 C) (Oral)   Wt 194 lb 9.6 oz (88.3 kg)   SpO2 97%   BMI 28.74 kg/m    Subjective:    Patient ID: Jared Herrera, male    DOB: [birth year], 70 y.o.   MRN: [id number]  HPI: Jared Herrera is a 70 y.o. male  Chief Complaint  Patient presents with  . Medication Refill    Patient is requesting refills on his medications.    HYPERTENSION / HYPERLIPIDEMIA Continues on this and Zestoretic for HTN and Atorvastatin 10 MG daily.  Had radiation treatment for Prostate Cancer two years ago and had recent follow-up with PSA checked within normal range.  Past smoker quit in 1985. Satisfied with current treatment? yes Duration of hypertension: chronic BP monitoring frequency: not checking BP range:  BP medication side effects: no Duration of hyperlipidemia: chronic Cholesterol medication side effects: no Cholesterol supplements: none Medication compliance: good compliance Aspirin: yes Recent stressors: no Recurrent headaches: no Visual changes: no Palpitations: no Dyspnea: no Chest pain: no Lower extremity edema: no Dizzy/lightheaded: no   IFG: Last A1C 5.8%.  No issues reported.  Has been focused on diet. Polydipsia/polyuria: no Visual disturbance: no Chest pain: no Paresthesias: no  Relevant past medical, surgical, family and social history reviewed and updated as indicated. Interim medical history since our last visit reviewed. Allergies and medications reviewed and updated.  Review of Systems  Constitutional: Negative for activity change, diaphoresis, fatigue and fever.  Respiratory: Negative for cough, chest tightness, shortness of breath and wheezing.   Cardiovascular: Negative for chest pain, palpitations and leg swelling.  Gastrointestinal: Negative.   Endocrine: Negative for cold intolerance, heat intolerance, polydipsia, polyphagia and polyuria.  Neurological: Negative.   Psychiatric/Behavioral: Negative.      Per HPI unless specifically indicated above     Objective:    BP 111/78   Pulse 92   Temp 98.4 F (36.9 C) (Oral)   Wt 194 lb 9.6 oz (88.3 kg)   SpO2 97%   BMI 28.74 kg/m   Wt Readings from Last 3 Encounters:  02/18/21 194 lb 9.6 oz (88.3 kg)  07/18/20 201 lb (91.2 kg)  07/04/20 201 lb (91.2 kg)    Physical Exam Vitals and nursing note reviewed.  Constitutional:      General: He is awake. He is not in acute distress.    Appearance: He is well-developed and overweight.  HENT:     Head: Normocephalic and atraumatic.     Right Ear: Hearing normal. No drainage.     Left Ear: Hearing normal. No drainage.  Eyes:     General: Lids are normal.        Right eye: No discharge.        Left eye: No discharge.     Conjunctiva/sclera: Conjunctivae normal.     Pupils: Pupils are equal, round, and reactive to light.  Neck:     Vascular: No carotid bruit.  Cardiovascular:     Rate and Rhythm: Normal rate and regular rhythm.     Heart sounds: Normal heart sounds, S1 normal and S2 normal. No murmur heard. No gallop.   Pulmonary:     Effort: Pulmonary effort is normal. No accessory muscle usage or respiratory distress.     Breath sounds: Normal breath sounds.  Abdominal:     General: Bowel sounds are normal.     Palpations: Abdomen is soft.  Musculoskeletal:  General: Normal range of motion.     Cervical back: Normal range of motion and neck supple.     Right lower leg: No edema.     Left lower leg: No edema.  Skin:    General: Skin is warm and dry.  Neurological:     Mental Status: He is alert and oriented to person, place, and time.  Psychiatric:        Mood and Affect: Mood normal.        Behavior: Behavior normal. Behavior is cooperative.        Thought Content: Thought content normal.        Judgment: Judgment normal.    Results for orders placed or performed in visit on 07/11/20  PSA  Result Value Ref Range   Prostatic Specific Antigen 0.10 0.00 - 4.00  ng/mL      Assessment & Plan:   Problem List Items Addressed This Visit      Cardiovascular and Mediastinum   Hypertension - Primary    Chronic, ongoing.  BP below goal.  Recommend he monitor BP at least a few mornings a week at home and document.  DASH diet at home.  Continue current medication regimen and adjust as needed.  Labs today: CBC, CMP, TSH.  Return in 6 months.  Refills sent in.        Relevant Medications   atorvastatin (LIPITOR) 10 MG tablet   Other Relevant Orders   Comprehensive metabolic panel   TSH   CBC with Differential/Platelet     Endocrine   IFG (impaired fasting glucose)    Recheck A1C today and adjust plan of care as needed.  Continue diet focus at this time.      Relevant Orders   Hemoglobin A1c     Other   History of prostate cancer    Stable -- followed by oncology with recent PSA stable, note reviewed.      Hyperlipemia    Chronic, ongoing.  Continue current medication regimen and adjust dose as needed.  Lipid panel today.        Relevant Medications   atorvastatin (LIPITOR) 10 MG tablet   Other Relevant Orders   Lipid Panel w/o Chol/HDL Ratio   Overweight    BMI 28.74.  Recommended eating smaller high protein, low fat meals more frequently and exercising 30 mins a day 5 times a week with a goal of 10-15lb weight loss in the next 3 months. Patient voiced their understanding and motivation to adhere to these recommendations.        Other Visit Diagnoses    Colon cancer screening       GI referral placed -- due 04/05/21   Relevant Orders   Ambulatory referral to Gastroenterology       Follow up plan: Return in about 6 months (around 08/21/2021) for HTN/HLD.

## 2021-02-18 NOTE — Patient Instructions (Signed)

## 2021-02-18 NOTE — Assessment & Plan Note (Signed)
Chronic, ongoing.  Continue current medication regimen and adjust dose as needed.  Lipid panel today.   

## 2021-02-18 NOTE — Assessment & Plan Note (Signed)
Chronic, ongoing.  BP below goal.  Recommend he monitor BP at least a few mornings a week at home and document.  DASH diet at home.  Continue current medication regimen and adjust as needed.  Labs today: CBC, CMP, TSH.  Return in 6 months.  Refills sent in. ? ? ?

## 2021-02-18 NOTE — Assessment & Plan Note (Signed)
Recheck A1C today and adjust plan of care as needed.  Continue diet focus at this time.

## 2021-02-18 NOTE — Assessment & Plan Note (Signed)
BMI 28.74.  Recommended eating smaller high protein, low fat meals more frequently and exercising 30 mins a day 5 times a week with a goal of 10-15lb weight loss in the next 3 months. Patient voiced their understanding and motivation to adhere to these recommendations.  

## 2021-02-18 NOTE — Assessment & Plan Note (Signed)
Stable -- followed by oncology with recent PSA stable, note reviewed.

## 2021-02-19 ENCOUNTER — Encounter: Payer: Self-pay | Admitting: Nurse Practitioner

## 2021-02-19 DIAGNOSIS — R7309 Other abnormal glucose: Secondary | ICD-10-CM | POA: Insufficient documentation

## 2021-02-19 LAB — LIPID PANEL W/O CHOL/HDL RATIO
Cholesterol, Total: 121 mg/dL (ref 100–199)
HDL: 41 mg/dL (ref >39)
LDL Chol Calc (NIH): 68 mg/dL (ref 0–99)
Triglycerides: 56 mg/dL (ref 0–149)
VLDL Cholesterol Cal: 12 mg/dL (ref 5–40)

## 2021-02-19 LAB — CBC WITH DIFFERENTIAL/PLATELET
Basophils Absolute: 0 10*3/uL (ref 0.0–0.2)
Basos: 1 %
EOS (ABSOLUTE): 0 10*3/uL (ref 0.0–0.4)
Eos: 0 %
Hematocrit: 42.1 % (ref 37.5–51.0)
Hemoglobin: 14.3 g/dL (ref 13.0–17.7)
Immature Grans (Abs): 0 10*3/uL (ref 0.0–0.1)
Immature Granulocytes: 0 %
Lymphocytes Absolute: 2.1 10*3/uL (ref 0.7–3.1)
Lymphs: 28 %
MCH: 30.8 pg (ref 26.6–33.0)
MCHC: 34 g/dL (ref 31.5–35.7)
MCV: 91 fL (ref 79–97)
Monocytes Absolute: 0.7 10*3/uL (ref 0.1–0.9)
Monocytes: 10 %
Neutrophils Absolute: 4.4 10*3/uL (ref 1.4–7.0)
Neutrophils: 61 %
Platelets: 337 10*3/uL (ref 150–450)
RBC: 4.64 x10E6/uL (ref 4.14–5.80)
RDW: 11.8 % (ref 11.6–15.4)
WBC: 7.3 10*3/uL (ref 3.4–10.8)

## 2021-02-19 LAB — COMPREHENSIVE METABOLIC PANEL
ALT: 23 IU/L (ref 0–44)
AST: 24 IU/L (ref 0–40)
Albumin/Globulin Ratio: 1.3 (ref 1.2–2.2)
Albumin: 4.2 g/dL (ref 3.7–4.7)
Alkaline Phosphatase: 81 IU/L (ref 44–121)
BUN/Creatinine Ratio: 16 (ref 10–24)
BUN: 12 mg/dL (ref 8–27)
Bilirubin Total: 0.8 mg/dL (ref 0.0–1.2)
CO2: 26 mmol/L (ref 20–29)
Calcium: 9.9 mg/dL (ref 8.6–10.2)
Chloride: 99 mmol/L (ref 96–106)
Creatinine, Ser: 0.76 mg/dL (ref 0.76–1.27)
Globulin, Total: 3.2 g/dL (ref 1.5–4.5)
Glucose: 112 mg/dL — ABNORMAL HIGH (ref 65–99)
Potassium: 4.2 mmol/L (ref 3.5–5.2)
Sodium: 141 mmol/L (ref 134–144)
Total Protein: 7.4 g/dL (ref 6.0–8.5)
eGFR: 96 mL/min/{1.73_m2} (ref >59)

## 2021-02-19 LAB — TSH: TSH: 0.631 u[IU]/mL (ref 0.450–4.500)

## 2021-02-19 LAB — HEMOGLOBIN A1C
Est. average glucose Bld gHb Est-mCnc: 134 mg/dL
Hgb A1c MFr Bld: 6.3 % — ABNORMAL HIGH (ref 4.8–5.6)

## 2021-02-19 NOTE — Progress Notes (Signed)
Contacted via MyChart   Good afternoon [name], your labs have returned: - Kidney function, eGFR and creatinine, and liver function, AST and ALT, are normal - Cholesterol levels are at goal - Thyroid normal and CBC shows no anemia.  - The A1C is the diabetes testing we talked about, this looks at your blood sugars over the past 3 months and turns the average into a number.  Your number is 6.3%, meaning you are prediabetic.  Any number 5.7 to 6.4 is considered prediabetes and any number 6.5 or greater is considered diabetes.   I would recommend heavy focus on decreasing foods high in sugar and your intake of things like bread products, pasta, and rice.  The American Diabetes Association online has a large amount of information on diet changes to make.  We will recheck this number in 6 months to ensure you are not continuing to trend upwards and move into diabetes. Any questions? Keep being awesome!!  Thank you for allowing me to participate in your care.  I appreciate you. Kindest regards, Glendi Mohiuddin

## 2021-03-17 ENCOUNTER — Encounter: Payer: Self-pay | Admitting: *Deleted

## 2021-05-27 ENCOUNTER — Ambulatory Visit: Payer: Medicare Other

## 2021-06-01 ENCOUNTER — Ambulatory Visit (INDEPENDENT_AMBULATORY_CARE_PROVIDER_SITE_OTHER): Payer: Medicare Other

## 2021-06-01 VITALS — Ht 70.0 in | Wt 190.0 lb

## 2021-06-01 DIAGNOSIS — Z0000 Encounter for general adult medical examination without abnormal findings: Secondary | ICD-10-CM

## 2021-06-01 NOTE — Progress Notes (Signed)
I connected with Jared Herrera today by telephone and verified that I am speaking with the correct person using two identifiers. Location patient: home Location provider: work Persons participating in the virtual visit:[place] [employer] manager/land agent, [employer] LPN.   I discussed the limitations, risks, security and privacy concerns of performing an evaluation and management service by telephone and the availability of in person appointments. I also discussed with the patient that there may be a patient responsible charge related to this service. The patient expressed understanding and verbally consented to this telephonic visit.    Interactive audio and video telecommunications were attempted between this provider and patient, however failed, due to patient having technical difficulties OR patient did not have access to video capability.  We continued and completed visit with audio only.     Vital signs may be patient reported or missing.  Subjective:   Jared Herrera is a 70 y.o. male who presents for Medicare Annual/Subsequent preventive examination.  Review of Systems     Cardiac Risk Factors include: advanced age (>61mn, >>53women);dyslipidemia;hypertension;male gender     Objective:    Today's Vitals   06/01/21 1027  Weight: 190 lb (86.2 kg)  Height: '5\' 10"'$  (1.778 m)   Body mass index is 27.26 kg/m.  Advanced Directives 06/01/2021 07/17/2020 05/26/2020 07/20/2019 04/26/2019 07/07/2018 07/04/2018  Does Patient Have a Medical Advance Directive? No No No No No No No  Would patient like information on creating a medical advance directive? - No - Patient declined - No - Patient declined - No - Patient declined No - Patient declined    Current Medications (verified) Outpatient Encounter Medications as of 06/01/2021  Medication Sig   Ascorbic Acid (VITAMIN C) 1000 MG tablet Take 1,000 mg by mouth daily.   aspirin 81 MG tablet Take 81 mg by mouth daily. Am, Reported on 11/11/2015    atorvastatin (LIPITOR) 10 MG tablet Take 1 tablet (10 mg total) by mouth daily.   Cholecalciferol (VITAMIN D3 PO) Take 1,000 Units by mouth daily.   lisinopril-hydrochlorothiazide (ZESTORETIC) 20-25 MG tablet Take 1 tablet by mouth daily.   Multiple Vitamin (MULTIVITAMIN) tablet Take 1 tablet by mouth daily.   vitamin B-12 (CYANOCOBALAMIN) 1000 MCG tablet Take 1,000 mcg by mouth daily.   No facility-administered encounter medications on file as of 06/01/2021.    Allergies (verified) Patient has no known allergies.   History: Past Medical History:  Diagnosis Date   Cancer ([facility]    Prostate   Cataract    bilateral   Hyperlipidemia    Hypertension    controlled on meds   Sinus drainage    Past Surgical History:  Procedure Laterality Date   COLONOSCOPY WITH PROPOFOL N/A 04/05/2016   Procedure: COLONOSCOPY WITH PROPOFOL;  Surgeon: DLucilla Lame MD;  Location: [facility]  Service: Endoscopy;  Laterality: N/A;   PROSTATE BIOPSY  March 2017   showed some cells, but they are slow growing   RADIOACTIVE SEED IMPLANT N/A 02/01/2017   Procedure: RADIOACTIVE SEED IMPLANT/BRACHYTHERAPY IMPLANT;  Surgeon: AHollice Espy MD;  Location: [facility];  Service: Urology;  Laterality: N/A;   TONSILLECTOMY AND ADENOIDECTOMY     WISDOM TOOTH EXTRACTION     Family History  Problem Relation Age of Onset   Heart disease Brother    Hypertension Brother    Hypertension Mother    Kidney disease Mother    Cancer Sister        thyroid   Lupus Daughter    Kidney disease  Daughter    COPD Neg Hx    Diabetes Neg Hx    Stroke Neg Hx    Social History   Socioeconomic History   Marital status: Legally Separated    Spouse name: Not on file   Number of children: Not on file   Years of education: Not on file   Highest education level: 10th grade  Occupational History   Occupation: retired  Tobacco Use   Smoking status: Former    Packs/day: 0.50    Years: 8.00    Pack years: 4.00    Types:  Cigarettes    Quit date: 10/11/1978    Years since quitting: 42.6   Smokeless tobacco: Never  Vaping Use   Vaping Use: Never used  Substance and Sexual Activity   Alcohol use: Not Currently    Alcohol/week: 0.0 standard drinks    Comment: occasional glass of wine   Drug use: No   Sexual activity: Not Currently  Other Topics Concern   Not on file  Social History Narrative   Not on file   Social Determinants of Health   Financial Resource Strain: Low Risk    Difficulty of Paying Living Expenses: Not hard at all  Food Insecurity: No Food Insecurity   Worried About Charity fundraiser in the Last Year: Never true   [place] in the Last Year: Never true  Transportation Needs: No Transportation Needs   Lack of Transportation (Medical): No   Lack of Transportation (Non-Medical): No  Physical Activity: Inactive   Days of Exercise per Week: 0 days   Minutes of Exercise per Session: 0 min  Stress: No Stress Concern Present   Feeling of Stress : Not at all  Social Connections: Not on file    Tobacco Counseling Counseling given: Not Answered   Clinical Intake:  Pre-visit preparation completed: Yes  Pain : No/denies pain     Nutritional Status: BMI 25 -29 Overweight Nutritional Risks: None Diabetes: No  How often do you need to have someone help you when you read instructions, pamphlets, or other written materials from your doctor or pharmacy?: 1 - Never What is the last grade level you completed in school?: 11th grade  Diabetic? no  Interpreter Needed?: No  Information entered by :: NAllen LPN   Activities of Daily Living In your present state of health, do you have any difficulty performing the following activities: 06/01/2021  Hearing? N  Vision? N  Difficulty concentrating or making decisions? N  Walking or climbing stairs? N  Dressing or bathing? N  Doing errands, shopping? N  Preparing Food and eating ? N  Using the Toilet? N  In the past six  months, have you accidently leaked urine? N  Do you have problems with loss of bowel control? N  Managing your Medications? N  Managing your Finances? N  Housekeeping or managing your Housekeeping? N  Some recent data might be hidden    Patient Care Team: Venita Lick, NP as PCP - General (Nurse Practitioner) Hollice Espy, MD as Consulting Physician (Urology) Minor, Dalbert Garnet, RN (Inactive) as [place] any recent [place] you may have received from other than [facility] providers in the past year (date may be approximate).     Assessment:   This is a routine wellness examination for [place].  Hearing/Vision screen Vision Screening - Comments:: No regular eye exams,  Dietary issues and exercise activities discussed: Current Exercise Habits:  The patient has a physically strenuous job, but has no regular exercise apart from work.   Goals Addressed             This Visit's Progress    Patient Stated       06/01/2021, wants to weigh 185 pounds       Depression Screen PHQ 2/9 Scores 06/01/2021 05/26/2020 04/26/2019 01/20/2018 06/29/2017 03/01/2017 01/18/2017  PHQ - 2 Score 0 0 0 1 0 0 0  PHQ- 9 Score - 0 - 4 - - 1    Fall Risk Fall Risk  06/01/2021 05/26/2020 09/05/2019 04/26/2019 01/20/2018  Falls in the past year? 0 0 0 0 No  Comment - - Emmi Telephone Survey: data to providers prior to load - -  Risk for fall due to : Medication side effect - - - -  Follow up Falls evaluation completed;Education provided;Falls prevention discussed - - - -    FALL RISK PREVENTION PERTAINING TO THE HOME:  Any stairs in or around the home? No  If so, are there any without handrails?  N/a Home free of loose throw rugs in walkways, pet beds, electrical cords, etc? Yes  Adequate lighting in your home to reduce risk of falls? Yes   ASSISTIVE DEVICES UTILIZED TO PREVENT FALLS:  Life alert? No  Use of a cane, walker or w/c? No  Grab bars in the  bathroom? No  Shower chair or bench in shower? Yes  Elevated toilet seat or a handicapped toilet? No   TIMED UP AND GO:  Was the test performed? No .      Cognitive Function:     6CIT Screen 06/01/2021 05/26/2020 04/26/2019 01/20/2018  What Year? 0 points 0 points 0 points 0 points  What month? 0 points 0 points 0 points 0 points  What time? 0 points 0 points 0 points 0 points  Count back from 20 0 points 0 points 0 points 0 points  Months in reverse 0 points 0 points 0 points 0 points  Repeat phrase 2 points 2 points 0 points 2 points  Total Score 2 2 0 2    Immunizations Immunization History  Administered Date(s) Administered   Fluad Quad(high Dose 65+) 07/04/2020   Influenza, High Dose Seasonal PF 08/20/2016, 07/28/2018   Influenza,inj,Quad PF,6+ Mos 10/10/2015   Influenza-Unspecified 08/25/2018, 07/12/2019   Moderna Sars-Covid-2 Vaccination 11/22/2019, 12/20/2019, 08/11/2020, 02/09/2021   PFIZER(Purple Top)SARS-COV-2 Vaccination 01/19/2021   Pneumococcal Conjugate-13 01/15/2015   Pneumococcal Polysaccharide-23 01/16/2016   Td 06/20/2007   Zoster, Live 01/11/2014    TDAP status: Due, Education has been provided regarding the importance of this vaccine. Advised may receive this vaccine at local pharmacy or Health Dept. Aware to provide a copy of the vaccination record if obtained from local pharmacy or Health Dept. Verbalized acceptance and understanding.  Flu Vaccine status: Due, Education has been provided regarding the importance of this vaccine. Advised may receive this vaccine at local pharmacy or Health Dept. Aware to provide a copy of the vaccination record if obtained from local pharmacy or Health Dept. Verbalized acceptance and understanding.  Pneumococcal vaccine status: Up to date  Covid-19 vaccine status: Completed vaccines  Qualifies for Shingles Vaccine? Yes   Zostavax completed Yes   Shingrix Completed?: No.    Education has been provided regarding the  importance of this vaccine. Patient has been advised to call insurance company to determine out of pocket expense if they have not yet received this vaccine. Advised may also receive  vaccine at local pharmacy or Health Dept. Verbalized acceptance and understanding.  Screening Tests Health Maintenance  Topic Date Due   Zoster Vaccines- Shingrix (1 of 2) Never done   TETANUS/TDAP  06/19/2017   COLONOSCOPY (Pts 45-51yr Insurance coverage will need to be confirmed)  04/05/2021   INFLUENZA VACCINE  05/11/2021   COVID-19 Vaccine  Completed   Hepatitis C Screening  Completed   PNA vac Low Risk Adult  Completed   HPV VACCINES  Aged Out    Health Maintenance  Health Maintenance Due  Topic Date Due   Zoster Vaccines- Shingrix (1 of 2) Never done   TETANUS/TDAP  06/19/2017   COLONOSCOPY (Pts 45-444yrInsurance coverage will need to be confirmed)  04/05/2021   INFLUENZA VACCINE  05/11/2021    Colorectal cancer screening: Type of screening: Colonoscopy. Completed 04/05/2016. Repeat every 5 years  Lung Cancer Screening: (Low Dose CT Chest recommended if Age 71-80ears, 30 pack-year currently smoking OR have quit w/in 15years.) does not qualify.   Lung Cancer Screening Referral: no  Additional Screening:  Hepatitis C Screening: does qualify; Completed 07/04/2020  Vision Screening: Recommended annual ophthalmology exams for early detection of glaucoma and other disorders of the eye. Is the patient up to date with their annual eye exam?  No  Who is the provider or what is the name of the office in which the patient attends annual eye exams? none If pt is not established with a provider, would they like to be referred to a provider to establish care? No .   Dental Screening: Recommended annual dental exams for proper oral hygiene  Community Resource Referral / Chronic Care Management: CRR required this visit?  No   CCM required this visit?  No      Plan:     I have personally  reviewed and noted the following in the patient's chart:   Medical and social history Use of alcohol, tobacco or illicit drugs  Current medications and supplements including opioid prescriptions. Patient is not currently taking opioid prescriptions. Functional ability and status Nutritional status Physical activity Advanced directives List of other physicians Hospitalizations, surgeries, and ER visits in previous 12 months Vitals Screenings to include cognitive, depression, and falls Referrals and appointments  In addition, I have reviewed and discussed with patient certain preventive protocols, quality metrics, and best practice recommendations. A written personalized care plan for preventive services as well as general preventive health recommendations were provided to patient.     NiKellie SimmeringLPN   [id number] Nurse Notes:

## 2021-06-01 NOTE — Patient Instructions (Signed)
Jared Herrera , Thank you for taking time to come for your Medicare Wellness Visit. I appreciate your ongoing commitment to your health goals. Please review the following plan we discussed and let me know if I can assist you in the future.   Screening recommendations/referrals: Colonoscopy: prefers cologuard Recommended yearly ophthalmology/optometry visit for glaucoma screening and checkup Recommended yearly dental visit for hygiene and checkup  Vaccinations: Influenza vaccine: due Pneumococcal vaccine: completed 01/16/2016 Tdap vaccine: due Shingles vaccine: discussed   Covid-19:  02/09/2021, 08/11/2020, 12/20/2019, 11/22/2019  Advanced directives: Advance directive discussed with you today.   Conditions/risks identified: none  Next appointment: Follow up in one year for your annual wellness visit.   Preventive Care 71 Years and Older, Male Preventive care refers to lifestyle choices and visits with your health care provider that can promote health and wellness. What does preventive care include? A yearly physical exam. This is also called an annual well check. Dental exams once or twice a year. Routine eye exams. Ask your health care provider how often you should have your eyes checked. Personal lifestyle choices, including: Daily care of your teeth and gums. Regular physical activity. Eating a healthy diet. Avoiding tobacco and drug use. Limiting alcohol use. Practicing safe sex. Taking low doses of aspirin every day. Taking vitamin and mineral supplements as recommended by your health care provider. What happens during an annual well check? The services and screenings done by your health care provider during your annual well check will depend on your age, overall health, lifestyle risk factors, and family history of disease. Counseling  Your health care provider may ask you questions about your: Alcohol use. Tobacco use. Drug use. Emotional well-being. Home and relationship  well-being. Sexual activity. Eating habits. History of falls. Memory and ability to understand (cognition). Work and work Statistician. Screening  You may have the following tests or measurements: Height, weight, and BMI. Blood pressure. Lipid and cholesterol levels. These may be checked every 5 years, or more frequently if you are over 71 years old. Skin check. Lung cancer screening. You may have this screening every year starting at age 71 if you have a 30-pack-year history of smoking and currently smoke or have quit within the past 15 years. Fecal occult blood test (FOBT) of the stool. You may have this test every year starting at age 71 Flexible sigmoidoscopy or colonoscopy. You may have a sigmoidoscopy every 5 years or a colonoscopy every 10 years starting at age 71 Prostate cancer screening. Recommendations will vary depending on your family history and other risks. Hepatitis C blood test. Hepatitis B blood test. Sexually transmitted disease (STD) testing. Diabetes screening. This is done by checking your blood sugar (glucose) after you have not eaten for a while (fasting). You may have this done every 1-3 years. Abdominal aortic aneurysm (AAA) screening. You may need this if you are a current or former smoker. Osteoporosis. You may be screened starting at age 71 if you are at high risk. Talk with your health care provider about your test results, treatment options, and if necessary, the need for more tests. Vaccines  Your health care provider may recommend certain vaccines, such as: Influenza vaccine. This is recommended every year. Tetanus, diphtheria, and acellular pertussis (Tdap, Td) vaccine. You may need a Td booster every 10 years. Zoster vaccine. You may need this after age 71. Pneumococcal 13-valent conjugate (PCV13) vaccine. One dose is recommended after age 71. Pneumococcal polysaccharide (PPSV23) vaccine. One dose is recommended after age 71.  Talk to your health care  provider about which screenings and vaccines you need and how often you need them. This information is not intended to replace advice given to you by your health care provider. Make sure you discuss any questions you have with your health care provider. Document Released: 10/24/2015 Document Revised: 06/16/2016 Document Reviewed: 07/29/2015 Elsevier Interactive Patient Education  2017 [place] Prevention in the Home Falls can cause injuries. They can happen to people of all ages. There are many things you can do to make your home safe and to help prevent falls. What can I do on the outside of my home? Regularly fix the edges of walkways and driveways and fix any cracks. Remove anything that might make you trip as you walk through a door, such as a raised step or threshold. Trim any bushes or trees on the path to your home. Use bright outdoor lighting. Clear any walking paths of anything that might make someone trip, such as rocks or tools. Regularly check to see if handrails are loose or broken. Make sure that both sides of any steps have handrails. Any raised decks and porches should have guardrails on the edges. Have any leaves, snow, or ice cleared regularly. Use sand or salt on walking paths during winter. Clean up any spills in your garage right away. This includes oil or grease spills. What can I do in the bathroom? Use night lights. Install grab bars by the toilet and in the tub and shower. Do not use towel bars as grab bars. Use non-skid mats or decals in the tub or shower. If you need to sit down in the shower, use a plastic, non-slip stool. Keep the floor dry. Clean up any water that spills on the floor as soon as it happens. Remove soap buildup in the tub or shower regularly. Attach bath mats securely with double-sided non-slip rug tape. Do not have throw rugs and other things on the floor that can make you trip. What can I do in the bedroom? Use night lights. Make  sure that you have a light by your bed that is easy to reach. Do not use any sheets or blankets that are too big for your bed. They should not hang down onto the floor. Have a firm chair that has side arms. You can use this for support while you get dressed. Do not have throw rugs and other things on the floor that can make you trip. What can I do in the kitchen? Clean up any spills right away. Avoid walking on wet floors. Keep items that you use a lot in easy-to-reach places. If you need to reach something above you, use a strong step stool that has a grab bar. Keep electrical cords out of the way. Do not use floor polish or wax that makes floors slippery. If you must use wax, use non-skid floor wax. Do not have throw rugs and other things on the floor that can make you trip. What can I do with my stairs? Do not leave any items on the stairs. Make sure that there are handrails on both sides of the stairs and use them. Fix handrails that are broken or loose. Make sure that handrails are as long as the stairways. Check any carpeting to make sure that it is firmly attached to the stairs. Fix any carpet that is loose or worn. Avoid having throw rugs at the top or bottom of the stairs. If you do have throw  rugs, attach them to the floor with carpet tape. Make sure that you have a light switch at the top of the stairs and the bottom of the stairs. If you do not have them, ask someone to add them for you. What else can I do to help prevent falls? Wear shoes that: Do not have high heels. Have rubber bottoms. Are comfortable and fit you well. Are closed at the toe. Do not wear sandals. If you use a stepladder: Make sure that it is fully opened. Do not climb a closed stepladder. Make sure that both sides of the stepladder are locked into place. Ask someone to hold it for you, if possible. Clearly mark and make sure that you can see: Any grab bars or handrails. First and last steps. Where the  edge of each step is. Use tools that help you move around (mobility aids) if they are needed. These include: Canes. Walkers. Scooters. Crutches. Turn on the lights when you go into a dark area. Replace any light bulbs as soon as they burn out. Set up your furniture so you have a clear path. Avoid moving your furniture around. If any of your floors are uneven, fix them. If there are any pets around you, be aware of where they are. Review your medicines with your doctor. Some medicines can make you feel dizzy. This can increase your chance of falling. Ask your doctor what other things that you can do to help prevent falls. This information is not intended to replace advice given to you by your health care provider. Make sure you discuss any questions you have with your health care provider. Document Released: 07/24/2009 Document Revised: 03/04/2016 Document Reviewed: 11/01/2014 Elsevier Interactive Patient Education  2017 [employer].

## 2021-07-13 ENCOUNTER — Inpatient Hospital Stay: Payer: Medicare Other | Attending: Radiation Oncology

## 2021-07-13 DIAGNOSIS — C61 Malignant neoplasm of prostate: Secondary | ICD-10-CM | POA: Diagnosis not present

## 2021-07-13 DIAGNOSIS — Z923 Personal history of irradiation: Secondary | ICD-10-CM | POA: Insufficient documentation

## 2021-07-13 DIAGNOSIS — Z23 Encounter for immunization: Secondary | ICD-10-CM | POA: Diagnosis not present

## 2021-07-13 LAB — PSA: Prostatic Specific Antigen: 0.06 ng/mL (ref 0.00–4.00)

## 2021-07-20 ENCOUNTER — Ambulatory Visit
Admission: RE | Admit: 2021-07-20 | Discharge: 2021-07-20 | Disposition: A | Discharge status: Home / Self Care | Payer: Medicare Other | Source: Ambulatory Visit | Attending: Radiation Oncology | Admitting: Radiation Oncology

## 2021-07-20 ENCOUNTER — Encounter: Payer: Self-pay | Admitting: Radiation Oncology

## 2021-07-20 ENCOUNTER — Other Ambulatory Visit: Payer: Self-pay

## 2021-07-20 VITALS — BP 121/77 | HR 82 | Temp 96.4°F | Resp 18 | Wt 191.6 lb

## 2021-07-20 DIAGNOSIS — C61 Malignant neoplasm of prostate: Secondary | ICD-10-CM

## 2021-07-20 DIAGNOSIS — Z923 Personal history of irradiation: Secondary | ICD-10-CM | POA: Insufficient documentation

## 2021-07-20 NOTE — Progress Notes (Signed)
Radiation Oncology Follow up Note  Name: Jared Herrera   Date:   07/20/2021 MRN:  [id number] DOB: 04/13/1951    This 70 y.o. 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  REFERRING PROVIDER: Venita Lick, NP  HPI: Patient is a 70 year old male now out 4 and half years having completed I-125 interstitial implant for Gleason 6 (3+3) presenting with a PSA of 6.2 seen today in routine follow-up he is doing well specifically denies any increased lower urinary tract symptoms diarrhea or fatigue his most recent PSA is 0.06.Marland Kitchen  COMPLICATIONS OF TREATMENT: none  FOLLOW UP COMPLIANCE: keeps appointments   PHYSICAL EXAM:  BP 121/77 (BP Location: Left Arm)   Pulse 82   Temp (!) 96.4 F (35.8 C) (Tympanic)   Resp 18   Wt 191 lb 9.6 oz (86.9 kg)   BMI 27.49 kg/m  Well-developed well-nourished patient in NAD. HEENT reveals PERLA, EOMI, discs not visualized.  Oral cavity is clear. No oral mucosal lesions are identified. Neck is clear without evidence of cervical or supraclavicular adenopathy. Lungs are clear to A&P. Cardiac examination is essentially unremarkable with regular rate and rhythm without murmur rub or thrill. Abdomen is benign with no organomegaly or masses noted. Motor sensory and DTR levels are equal and symmetric in the upper and lower extremities. Cranial nerves II through XII are grossly intact. Proprioception is intact. No peripheral adenopathy or edema is identified. No motor or sensory levels are noted. Crude visual fields are within normal range.  RADIOLOGY RESULTS: No current films to review  PLAN: Present time patient is under excellent biochemical control of his prostate cancer now out close to 5 years.  I am going to discontinue follow-up care.  I have asked for his PMD to do a yearly PSA.  I be happy to reevaluate him anytime should that be necessary.  Patient knows to  call with any concerns.  I would like to take this opportunity to thank you for allowing me to participate in the care of your patient.Noreene Filbert, MD

## 2021-08-21 ENCOUNTER — Other Ambulatory Visit: Payer: Self-pay

## 2021-08-21 ENCOUNTER — Encounter: Payer: Medicare Other | Admitting: Nurse Practitioner

## 2021-08-21 ENCOUNTER — Encounter: Payer: Self-pay | Admitting: Nurse Practitioner

## 2021-08-21 VITALS — BP 126/69 | HR 97 | Temp 99.0°F | Wt 196.4 lb

## 2021-08-21 DIAGNOSIS — E782 Mixed hyperlipidemia: Secondary | ICD-10-CM | POA: Diagnosis not present

## 2021-08-21 DIAGNOSIS — R7309 Other abnormal glucose: Secondary | ICD-10-CM
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
## 2021-08-22 LAB — COMPREHENSIVE METABOLIC PANEL
ALT: 19 IU/L (ref 0–44)
AST: 22 IU/L (ref 0–40)
Albumin/Globulin Ratio: 1.4 (ref 1.2–2.2)
Albumin: 4.2 g/dL (ref 3.7–4.7)
Alkaline Phosphatase: 86 IU/L (ref 44–121)
BUN/Creatinine Ratio: 15 (ref 10–24)
BUN: 10 mg/dL (ref 8–27)
Bilirubin Total: 0.6 mg/dL (ref 0.0–1.2)
CO2: 25 mmol/L (ref 20–29)
Calcium: 9.5 mg/dL (ref 8.6–10.2)
Chloride: 100 mmol/L (ref 96–106)
Creatinine, Ser: 0.65 mg/dL — ABNORMAL LOW (ref 0.76–1.27)
Globulin, Total: 2.9 g/dL (ref 1.5–4.5)
Glucose: 115 mg/dL — ABNORMAL HIGH (ref 70–99)
Potassium: 4.1 mmol/L (ref 3.5–5.2)
Sodium: 138 mmol/L (ref 134–144)
Total Protein: 7.1 g/dL (ref 6.0–8.5)
eGFR: 101 mL/min/{1.73_m2} (ref >59)

## 2021-08-22 LAB — HEMOGLOBIN A1C
Est. average glucose Bld gHb Est-mCnc: 128 mg/dL
Hgb A1c MFr Bld: 6.1 % — ABNORMAL HIGH (ref 4.8–5.6)

## 2021-08-22 LAB — LIPID PANEL W/O CHOL/HDL RATIO
Cholesterol, Total: 120 mg/dL (ref 100–199)
HDL: 41 mg/dL (ref >39)
LDL Chol Calc (NIH): 66 mg/dL (ref 0–99)
Triglycerides: 60 mg/dL (ref 0–149)
VLDL Cholesterol Cal: 13 mg/dL (ref 5–40)
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
## 2022-02-14 NOTE — Patient Instructions (Signed)

## 2022-02-18 ENCOUNTER — Encounter: Payer: Self-pay | Admitting: Nurse Practitioner

## 2022-02-18 ENCOUNTER — Ambulatory Visit (INDEPENDENT_AMBULATORY_CARE_PROVIDER_SITE_OTHER): Payer: Medicare Other | Admitting: Nurse Practitioner

## 2022-02-18 VITALS — BP 120/69 | HR 82 | Temp 97.5°F | Ht 70.0 in | Wt 193.6 lb

## 2022-02-18 DIAGNOSIS — I10 Essential (primary) hypertension: Secondary | ICD-10-CM | POA: Diagnosis not present

## 2022-02-18 DIAGNOSIS — R7309 Other abnormal glucose: Secondary | ICD-10-CM

## 2022-02-18 DIAGNOSIS — E663 Overweight: Secondary | ICD-10-CM

## 2022-02-18 DIAGNOSIS — E782 Mixed hyperlipidemia: Secondary | ICD-10-CM

## 2022-02-18 DIAGNOSIS — Z8546 Personal history of malignant neoplasm of prostate: Secondary | ICD-10-CM

## 2022-02-18 LAB — MICROALBUMIN, URINE WAIVED
Creatinine, Urine Waived: 300 mg/dL (ref 10–300)
Microalb, Ur Waived: 30 mg/L — ABNORMAL HIGH (ref 0–19)
Microalb/Creat Ratio: <30 mg/g (ref <30)

## 2022-02-18 LAB — BAYER DCA HB A1C WAIVED: HB A1C (BAYER DCA - WAIVED): 5.7 % — ABNORMAL HIGH (ref 4.8–5.6)

## 2022-02-18 MED ORDER — ATORVASTATIN CALCIUM 10 MG PO TABS: 10.0000 mg | ORAL_TABLET | Freq: Every day | ORAL | 4 refills | Status: DC

## 2022-02-18 MED ORDER — LISINOPRIL-HYDROCHLOROTHIAZIDE 20-25 MG PO TABS: 1.0000 | ORAL_TABLET | Freq: Every day | ORAL | 4 refills | Status: DC

## 2022-02-18 NOTE — Assessment & Plan Note (Signed)
BMI 27.78.  Recommended eating smaller high protein, low fat meals more frequently and exercising 30 mins a day 5 times a week with a goal of 10-15lb weight loss in the next 3 months. Patient voiced their understanding and motivation to adhere to these recommendations. ? ?

## 2022-02-18 NOTE — Assessment & Plan Note (Signed)
Chronic, ongoing.  Continue current medication regimen and adjust dose as needed.  Lipid panel today.   

## 2022-02-18 NOTE — Assessment & Plan Note (Signed)
Chronic, ongoing.  BP below goal.  Recommend he monitor BP at least a few mornings a week at home and document.  DASH diet at home.  Continue current medication regimen and adjust as needed.  Labs today: CBC, CMP, TSH.  Return in 6 months.  Refills sent in. ? ? ?

## 2022-02-18 NOTE — Assessment & Plan Note (Signed)
Noted on past labs, today is trending down with diet changes and regular activity.  From 6.1% to now 5.7% -- continue current diet focus. ?

## 2022-02-18 NOTE — Progress Notes (Signed)
? ?BP 120/69   Pulse 82   Temp (!) 97.5 ?F (36.4 ?C) (Oral)   Ht $R'5\' 10"'eS$  (1.778 m)   Wt 193 lb 9.6 oz (87.8 kg)   SpO2 97%   BMI 27.78 kg/m?   ? ?Subjective:  ? ? Patient ID: Jared Herrera, male    DOB: 1951/08/11, 71 y.o.   MRN: [id number] ? ?HPI: ?Jared Herrera is a 71 y.o. male ? ?Chief Complaint  ?Patient presents with  ? Hyperlipidemia  ? Hypertension  ? History of Prostate Cancer  ? Elevated A1c   ? ?HYPERTENSION / HYPERLIPIDEMIA ?Continues on Zestoretic for HTN and Atorvastatin 10 MG daily. ? ?Had radiation treatment (seeds placed in 2018) for prostate cancer 5 years ago and had recent follow-up with Dr. Baruch Gouty on 07/20/21 for this.  Past smoker quit in 1985. ?Satisfied with current treatment? yes ?Duration of hypertension: chronic ?BP monitoring frequency: not checking ?BP range:  ?BP medication side effects: no ?Duration of hyperlipidemia: chronic ?Cholesterol medication side effects: no ?Cholesterol supplements: none ?Medication compliance: good compliance ?Aspirin: yes ?Recent stressors: no ?Recurrent headaches: no ?Visual changes: no ?Palpitations: no ?Dyspnea: no ?Chest pain: no ?Lower extremity edema: no ?Dizzy/lightheaded: no  ? ?IFG: ?Last A1c 6.1% in November 2022.  Has been focused on diet. ?Polydipsia/polyuria: no ?Visual disturbance: no ?Chest pain: no ?Paresthesias: no ? ?Relevant past medical, surgical, family and social history reviewed and updated as indicated. Interim medical history since our last visit reviewed. ?Allergies and medications reviewed and updated. ? ?Review of Systems  ?Constitutional:  Negative for activity change, diaphoresis, fatigue and fever.  ?Respiratory:  Negative for cough, chest tightness, shortness of breath and wheezing.   ?Cardiovascular:  Negative for chest pain, palpitations and leg swelling.  ?Gastrointestinal: Negative.   ?Endocrine: Negative for cold intolerance, heat intolerance, polydipsia, polyphagia and polyuria.  ?Neurological:  Negative.   ?Psychiatric/Behavioral: Negative.    ? ?Per HPI unless specifically indicated above ? ?   ?Objective:  ?  ?BP 120/69   Pulse 82   Temp (!) 97.5 ?F (36.4 ?C) (Oral)   Ht $R'5\' 10"'mo$  (1.778 m)   Wt 193 lb 9.6 oz (87.8 kg)   SpO2 97%   BMI 27.78 kg/m?   ?Wt Readings from Last 3 Encounters:  ?02/18/22 193 lb 9.6 oz (87.8 kg)  ?08/21/21 196 lb 6.4 oz (89.1 kg)  ?07/20/21 191 lb 9.6 oz (86.9 kg)  ?  ?Physical Exam ?Vitals and nursing note reviewed.  ?Constitutional:   ?   General: He is awake. He is not in acute distress. ?   Appearance: He is well-developed and overweight.  ?HENT:  ?   Head: Normocephalic and atraumatic.  ?   Right Ear: Hearing normal. No drainage.  ?   Left Ear: Hearing normal. No drainage.  ?Eyes:  ?   General: Lids are normal.     ?   Right eye: No discharge.     ?   Left eye: No discharge.  ?   Conjunctiva/sclera: Conjunctivae normal.  ?   Pupils: Pupils are equal, round, and reactive to light.  ?Neck:  ?   Vascular: No carotid bruit.  ?Cardiovascular:  ?   Rate and Rhythm: Normal rate and regular rhythm.  ?   Heart sounds: Normal heart sounds, S1 normal and S2 normal. No murmur heard. ?  No gallop.  ?Pulmonary:  ?   Effort: Pulmonary effort is normal. No accessory muscle usage or respiratory distress.  ?   Breath  sounds: Normal breath sounds.  ?Abdominal:  ?   General: Bowel sounds are normal.  ?   Palpations: Abdomen is soft.  ?Musculoskeletal:     ?   General: Normal range of motion.  ?   Cervical back: Normal range of motion and neck supple.  ?   Right lower leg: No edema.  ?   Left lower leg: No edema.  ?Skin: ?   General: Skin is warm and dry.  ?Neurological:  ?   Mental Status: He is alert and oriented to person, place, and time.  ?Psychiatric:     ?   Mood and Affect: Mood normal.     ?   Behavior: Behavior normal. Behavior is cooperative.     ?   Thought Content: Thought content normal.     ?   Judgment: Judgment normal.  ? ?Results for orders placed or performed in visit on  08/21/21  ?HgB A1c  ?Result Value Ref Range  ? Hgb A1c MFr Bld 6.1 (H) 4.8 - 5.6 %  ? Est. average glucose Bld gHb Est-mCnc 128 mg/dL  ?Comprehensive metabolic panel  ?Result Value Ref Range  ? Glucose 115 (H) 70 - 99 mg/dL  ? BUN 10 8 - 27 mg/dL  ? Creatinine, Ser 0.65 (L) 0.76 - 1.27 mg/dL  ? eGFR 101 >59 mL/min/1.73  ? BUN/Creatinine Ratio 15 10 - 24  ? Sodium 138 134 - 144 mmol/L  ? Potassium 4.1 3.5 - 5.2 mmol/L  ? Chloride 100 96 - 106 mmol/L  ? CO2 25 20 - 29 mmol/L  ? Calcium 9.5 8.6 - 10.2 mg/dL  ? Total Protein 7.1 6.0 - 8.5 g/dL  ? Albumin 4.2 3.7 - 4.7 g/dL  ? Globulin, Total 2.9 1.5 - 4.5 g/dL  ? Albumin/Globulin Ratio 1.4 1.2 - 2.2  ? Bilirubin Total 0.6 0.0 - 1.2 mg/dL  ? Alkaline Phosphatase 86 44 - 121 IU/L  ? AST 22 0 - 40 IU/L  ? ALT 19 0 - 44 IU/L  ?Lipid Panel w/o Chol/HDL Ratio  ?Result Value Ref Range  ? Cholesterol, Total 120 100 - 199 mg/dL  ? Triglycerides 60 0 - 149 mg/dL  ? HDL 41 >39 mg/dL  ? VLDL Cholesterol Cal 13 5 - 40 mg/dL  ? LDL Chol Calc (NIH) 66 0 - 99 mg/dL  ? ?   ?Assessment & Plan:  ? ?Problem List Items Addressed This Visit   ? ?  ? Cardiovascular and Mediastinum  ? Hypertension - Primary  ?  Chronic, ongoing.  BP below goal.  Recommend he monitor BP at least a few mornings a week at home and document.  DASH diet at home.  Continue current medication regimen and adjust as needed.  Labs today: CBC, CMP, TSH.  Return in 6 months.  Refills sent in. ? ? ? ?  ?  ? Relevant Medications  ? lisinopril-hydrochlorothiazide (ZESTORETIC) 20-25 MG tablet  ? atorvastatin (LIPITOR) 10 MG tablet  ? Other Relevant Orders  ? Microalbumin, Urine Waived  ? Basic metabolic panel  ? CBC with Differential/Platelet  ? TSH  ?  ? Other  ? Elevated hemoglobin A1c measurement  ?  Noted on past labs, today is trending down with diet changes and regular activity.  From 6.1% to now 5.7% -- continue current diet focus. ? ?  ?  ? Relevant Orders  ? Bayer DCA Hb A1c Waived  ? Microalbumin, Urine Waived  ?  History of prostate cancer  ?  Stable -- followed by oncology with recent note reviewed.  PSA on labs today. ? ?  ?  ? Relevant Orders  ? PSA  ? Hyperlipemia  ?  Chronic, ongoing.  Continue current medication regimen and adjust dose as needed.  Lipid panel today.   ? ?  ?  ? Relevant Medications  ? lisinopril-hydrochlorothiazide (ZESTORETIC) 20-25 MG tablet  ? atorvastatin (LIPITOR) 10 MG tablet  ? Other Relevant Orders  ? Lipid Panel w/o Chol/HDL Ratio  ? Overweight  ?  BMI 27.78.  Recommended eating smaller high protein, low fat meals more frequently and exercising 30 mins a day 5 times a week with a goal of 10-15lb weight loss in the next 3 months. Patient voiced their understanding and motivation to adhere to these recommendations. ? ? ?  ?  ?  ? ?Follow up plan: ?Return in about 6 months (around 08/21/2022) for HTN/HLD, PROSTATE CA, IFG. ?

## 2022-02-18 NOTE — Assessment & Plan Note (Signed)
Stable -- followed by oncology with recent note reviewed.  PSA on labs today. ?

## 2022-02-19 LAB — BASIC METABOLIC PANEL
BUN/Creatinine Ratio: 23 (ref 10–24)
BUN: 17 mg/dL (ref 8–27)
CO2: 23 mmol/L (ref 20–29)
Calcium: 9.5 mg/dL (ref 8.6–10.2)
Chloride: 100 mmol/L (ref 96–106)
Creatinine, Ser: 0.74 mg/dL — ABNORMAL LOW (ref 0.76–1.27)
Glucose: 102 mg/dL — ABNORMAL HIGH (ref 70–99)
Potassium: 3.5 mmol/L (ref 3.5–5.2)
Sodium: 140 mmol/L (ref 134–144)
eGFR: 96 mL/min/{1.73_m2} (ref >59)

## 2022-02-19 LAB — CBC WITH DIFFERENTIAL/PLATELET
Basophils Absolute: 0 10*3/uL (ref 0.0–0.2)
Basos: 1 %
EOS (ABSOLUTE): 0.1 10*3/uL (ref 0.0–0.4)
Eos: 1 %
Hematocrit: 43.2 % (ref 37.5–51.0)
Hemoglobin: 14.3 g/dL (ref 13.0–17.7)
Immature Grans (Abs): 0 10*3/uL (ref 0.0–0.1)
Immature Granulocytes: 0 %
Lymphocytes Absolute: 2.6 10*3/uL (ref 0.7–3.1)
Lymphs: 41 %
MCH: 30.6 pg (ref 26.6–33.0)
MCHC: 33.1 g/dL (ref 31.5–35.7)
MCV: 92 fL (ref 79–97)
Monocytes Absolute: 0.7 10*3/uL (ref 0.1–0.9)
Monocytes: 11 %
Neutrophils Absolute: 3 10*3/uL (ref 1.4–7.0)
Neutrophils: 46 %
Platelets: 248 10*3/uL (ref 150–450)
RBC: 4.68 x10E6/uL (ref 4.14–5.80)
RDW: 12.3 % (ref 11.6–15.4)
WBC: 6.4 10*3/uL (ref 3.4–10.8)

## 2022-02-19 LAB — LIPID PANEL W/O CHOL/HDL RATIO
Cholesterol, Total: 124 mg/dL (ref 100–199)
HDL: 44 mg/dL (ref >39)
LDL Chol Calc (NIH): 67 mg/dL (ref 0–99)
Triglycerides: 57 mg/dL (ref 0–149)
VLDL Cholesterol Cal: 13 mg/dL (ref 5–40)

## 2022-02-19 LAB — PSA: Prostate Specific Ag, Serum: 0.2 ng/mL (ref 0.0–4.0)

## 2022-02-19 LAB — TSH: TSH: 1.03 u[IU]/mL (ref 0.450–4.500)

## 2022-02-19 NOTE — Progress Notes (Signed)
Contacted via [facility] ? ? ?Good afternoon Maddock, your labs have returned and are all stable.  No medication changes needed.  Continue your current regimen.  Any questions? ?Keep being amazing!!  Thank you for allowing me to participate in your care.  I appreciate you. ?Kindest regards, ?Andru Genter ?

## 2022-06-02 ENCOUNTER — Ambulatory Visit (INDEPENDENT_AMBULATORY_CARE_PROVIDER_SITE_OTHER): Payer: Medicare Other | Admitting: *Deleted

## 2022-06-02 DIAGNOSIS — Z0000 Encounter for general adult medical examination without abnormal findings: Secondary | ICD-10-CM

## 2022-06-02 NOTE — Patient Instructions (Signed)
Jared Herrera , Thank you for taking time to come for your Medicare Wellness Visit. I appreciate your ongoing commitment to your health goals. Please review the following plan we discussed and let me know if I can assist you in the future.   Screening recommendations/referrals: Colonoscopy: Education provided   Recommended yearly ophthalmology/optometry visit for glaucoma screening and checkup Recommended yearly dental visit for hygiene and checkup  Vaccinations: Influenza vaccine: up to date Pneumococcal vaccine: up to date Tdap vaccine: up to date Shingles vaccine:  Education provided    Advanced directives: Education provided  Conditions/risks identified:   Next appointment: 08-23-2022 @ 8:40  Jared Herrera 71 Years and Older, Male Preventive care refers to lifestyle choices and visits with your health care provider that can promote health and wellness. What does preventive care include? A yearly physical exam. This is also called an annual well check. Dental exams once or twice a year. Routine eye exams. Ask your health care provider how often you should have your eyes checked. Personal lifestyle choices, including: Daily care of your teeth and gums. Regular physical activity. Eating a healthy diet. Avoiding tobacco and drug use. Limiting alcohol use. Practicing safe sex. Taking low doses of aspirin every day. Taking vitamin and mineral supplements as recommended by your health care provider. What happens during an annual well check? The services and screenings done by your health care provider during your annual well check will depend on your age, overall health, lifestyle risk factors, and family history of disease. Counseling  Your health care provider may ask you questions about your: Alcohol use. Tobacco use. Drug use. Emotional well-being. Home and relationship well-being. Sexual activity. Eating habits. History of falls. Memory and ability to  understand (cognition). Work and work Statistician. Screening  You may have the following tests or measurements: Height, weight, and BMI. Blood pressure. Lipid and cholesterol levels. These may be checked every 5 years, or more frequently if you are over 28 years old. Skin check. Lung cancer screening. You may have this screening every year starting at age 53 if you have a 30-pack-year history of smoking and currently smoke or have quit within the past 15 years. Fecal occult blood test (FOBT) of the stool. You may have this test every year starting at age 63. Flexible sigmoidoscopy or colonoscopy. You may have a sigmoidoscopy every 5 years or a colonoscopy every 10 years starting at age 77. Prostate cancer screening. Recommendations will vary depending on your family history and other risks. Hepatitis C blood test. Hepatitis B blood test. Sexually transmitted disease (STD) testing. Diabetes screening. This is done by checking your blood sugar (glucose) after you have not eaten for a while (fasting). You may have this done every 1-3 years. Abdominal aortic aneurysm (AAA) screening. You may need this if you are a current or former smoker. Osteoporosis. You may be screened starting at age 64 if you are at high risk. Talk with your health care provider about your test results, treatment options, and if necessary, the need for more tests. Vaccines  Your health care provider may recommend certain vaccines, such as: Influenza vaccine. This is recommended every year. Tetanus, diphtheria, and acellular pertussis (Tdap, Td) vaccine. You may need a Td booster every 10 years. Zoster vaccine. You may need this after age 72. Pneumococcal 13-valent conjugate (PCV13) vaccine. One dose is recommended after age 5. Pneumococcal polysaccharide (PPSV23) vaccine. One dose is recommended after age 45. Talk to your health care provider about which  screenings and vaccines you need and how often you need them. This  information is not intended to replace advice given to you by your health care provider. Make sure you discuss any questions you have with your health care provider. Document Released: 10/24/2015 Document Revised: 06/16/2016 Document Reviewed: 07/29/2015 Elsevier Interactive Patient Education  2017 [place] Prevention in the Home Falls can cause injuries. They can happen to people of all ages. There are many things you can do to make your home safe and to help prevent falls. What can I do on the outside of my home? Regularly fix the edges of walkways and driveways and fix any cracks. Remove anything that might make you trip as you walk through a door, such as a raised step or threshold. Trim any bushes or trees on the path to your home. Use bright outdoor lighting. Clear any walking paths of anything that might make someone trip, such as rocks or tools. Regularly check to see if handrails are loose or broken. Make sure that both sides of any steps have handrails. Any raised decks and porches should have guardrails on the edges. Have any leaves, snow, or ice cleared regularly. Use sand or salt on walking paths during winter. Clean up any spills in your garage right away. This includes oil or grease spills. What can I do in the bathroom? Use night lights. Install grab bars by the toilet and in the tub and shower. Do not use towel bars as grab bars. Use non-skid mats or decals in the tub or shower. If you need to sit down in the shower, use a plastic, non-slip stool. Keep the floor dry. Clean up any water that spills on the floor as soon as it happens. Remove soap buildup in the tub or shower regularly. Attach bath mats securely with double-sided non-slip rug tape. Do not have throw rugs and other things on the floor that can make you trip. What can I do in the bedroom? Use night lights. Make sure that you have a light by your bed that is easy to reach. Do not use any sheets or  blankets that are too big for your bed. They should not hang down onto the floor. Have a firm chair that has side arms. You can use this for support while you get dressed. Do not have throw rugs and other things on the floor that can make you trip. What can I do in the kitchen? Clean up any spills right away. Avoid walking on wet floors. Keep items that you use a lot in easy-to-reach places. If you need to reach something above you, use a strong step stool that has a grab bar. Keep electrical cords out of the way. Do not use floor polish or wax that makes floors slippery. If you must use wax, use non-skid floor wax. Do not have throw rugs and other things on the floor that can make you trip. What can I do with my stairs? Do not leave any items on the stairs. Make sure that there are handrails on both sides of the stairs and use them. Fix handrails that are broken or loose. Make sure that handrails are as long as the stairways. Check any carpeting to make sure that it is firmly attached to the stairs. Fix any carpet that is loose or worn. Avoid having throw rugs at the top or bottom of the stairs. If you do have throw rugs, attach them to the floor with carpet  tape. Make sure that you have a light switch at the top of the stairs and the bottom of the stairs. If you do not have them, ask someone to add them for you. What else can I do to help prevent falls? Wear shoes that: Do not have high heels. Have rubber bottoms. Are comfortable and fit you well. Are closed at the toe. Do not wear sandals. If you use a stepladder: Make sure that it is fully opened. Do not climb a closed stepladder. Make sure that both sides of the stepladder are locked into place. Ask someone to hold it for you, if possible. Clearly mark and make sure that you can see: Any grab bars or handrails. First and last steps. Where the edge of each step is. Use tools that help you move around (mobility aids) if they are  needed. These include: Canes. Walkers. Scooters. Crutches. Turn on the lights when you go into a dark area. Replace any light bulbs as soon as they burn out. Set up your furniture so you have a clear path. Avoid moving your furniture around. If any of your floors are uneven, fix them. If there are any pets around you, be aware of where they are. Review your medicines with your doctor. Some medicines can make you feel dizzy. This can increase your chance of falling. Ask your doctor what other things that you can do to help prevent falls. This information is not intended to replace advice given to you by your health care provider. Make sure you discuss any questions you have with your health care provider. Document Released: 07/24/2009 Document Revised: 03/04/2016 Document Reviewed: 11/01/2014 Elsevier Interactive Patient Education  2017 [employer].

## 2022-06-02 NOTE — Progress Notes (Signed)
Subjective:   Jared Herrera is a 71 y.o. male who presents for Medicare Annual/Subsequent preventive examination.  I connected with  Glory Buff Aracena on 06/02/22 by a telephone enabled telemedicine application and verified that I am speaking with the correct person using two identifiers.   I discussed the limitations of evaluation and management by telemedicine. The patient expressed understanding and agreed to proceed.  Patient location: home  Provider location: Tele-Health-home    Review of Systems     Cardiac Risk Factors include: advanced age (>49mn, >>63women);family history of premature cardiovascular disease;obesity (BMI >30kg/m2);male gender;hypertension     Objective:    Today's Vitals   There is no height or weight on file to calculate BMI.     06/02/2022   10:35 AM 06/02/2022   10:34 AM 07/20/2021    8:36 AM 06/01/2021   10:32 AM 07/17/2020    9:28 AM 05/26/2020   10:34 AM 07/20/2019    8:53 AM  Advanced Directives  Does Patient Have a Medical Advance Directive? No No No No No No No  Would patient like information on creating a medical advance directive?  No - Patient declined No - Patient declined  No - Patient declined  No - Patient declined    Current Medications (verified) Outpatient Encounter Medications as of 06/02/2022  Medication Sig   Ascorbic Acid (VITAMIN C) 1000 MG tablet Take 1,000 mg by mouth daily.   aspirin 81 MG tablet Take 81 mg by mouth daily. Am, Reported on 11/11/2015   atorvastatin (LIPITOR) 10 MG tablet Take 1 tablet (10 mg total) by mouth daily.   Cholecalciferol (VITAMIN D3 PO) Take 1,000 Units by mouth daily.   lisinopril-hydrochlorothiazide (ZESTORETIC) 20-25 MG tablet Take 1 tablet by mouth daily.   Multiple Vitamin (MULTIVITAMIN) tablet Take 1 tablet by mouth daily.   vitamin B-12 (CYANOCOBALAMIN) 1000 MCG tablet Take 1,000 mcg by mouth daily.   No facility-administered encounter medications on file as of 06/02/2022.     Allergies (verified) Patient has no active allergies.   History: Past Medical History:  Diagnosis Date   Cancer ([facility]    Prostate   Cataract    bilateral   Hyperlipidemia    Hypertension    controlled on meds   Sinus drainage    Past Surgical History:  Procedure Laterality Date   COLONOSCOPY WITH PROPOFOL N/A 04/05/2016   Procedure: COLONOSCOPY WITH PROPOFOL;  Surgeon: DLucilla Lame MD;  [place]  Service: Endoscopy;  Laterality: N/A;   PROSTATE BIOPSY  March 2017   showed some cells, but they are slow growing   RADIOACTIVE SEED IMPLANT N/A 02/01/2017   Procedure: RADIOACTIVE SEED IMPLANT/BRACHYTHERAPY IMPLANT;  Surgeon: AHollice Espy MD;  Location: [facility];  Service: Urology;  Laterality: N/A;   TONSILLECTOMY AND ADENOIDECTOMY     WISDOM TOOTH EXTRACTION     Family History  Problem Relation Age of Onset   Heart disease Brother    Hypertension Brother    Hypertension Mother    Kidney disease Mother    Cancer Sister        thyroid   Lupus Daughter    Kidney disease Daughter    COPD Neg Hx    Diabetes Neg Hx    Stroke Neg Hx    Social History   Socioeconomic History   Marital status: Legally Separated    Spouse name: Not on file   Number of children: Not on file   Years of education: Not  on file   Highest education level: 10th grade  Occupational History   Occupation: retired  Tobacco Use   Smoking status: Former    Packs/day: 0.50    Years: 8.00    Total pack years: 4.00    Types: Cigarettes    Quit date: 10/11/1978    Years since quitting: 43.6   Smokeless tobacco: Never  Vaping Use   Vaping Use: Never used  Substance and Sexual Activity   Alcohol use: Not Currently    Alcohol/week: 0.0 standard drinks of alcohol    Comment: occasional glass of wine   Drug use: No   Sexual activity: Not Currently  Other Topics Concern   Not on file  Social History Narrative   Not on file   Social Determinants of Health   Financial  Resource Strain: Low Risk  (06/02/2022)   Overall Financial Resource Strain (CARDIA)    Difficulty of Paying Living Expenses: Not hard at all  Food Insecurity: No Food Insecurity (06/02/2022)   Hunger Vital Sign    Worried About Running Out of Food in the Last Year: Never true    Ran Out of Food in the Last Year: Never true  Transportation Needs: No Transportation Needs (06/02/2022)   PRAPARE - Hydrologist (Medical): No    Lack of Transportation (Non-Medical): No  Physical Activity: Inactive (06/02/2022)   Exercise Vital Sign    Days of Exercise per Week: 0 days    Minutes of Exercise per Session: 0 min  Stress: No Stress Concern Present (06/02/2022)   [place]    Feeling of Stress : Not at all  Social Connections: Moderately Isolated (06/02/2022)   Social Connection and Isolation Panel [NHANES]    Frequency of Communication with Friends and Family: More than three times a week    Frequency of Social Gatherings with Friends and Family: Three times a week    Attends Religious Services: More than 4 times per year    Active Member of [place] or Organizations: No    Attends Archivist Meetings: Never    Marital Status: Separated    Tobacco Counseling Counseling given: Not Answered   Clinical Intake:  Pre-visit preparation completed: Yes  Pain : No/denies pain     Nutritional Risks: None Diabetes: No  How often do you need to have someone help you when you read instructions, pamphlets, or other written materials from your doctor or pharmacy?: 2 - Rarely  Diabetic?  no  Interpreter Needed?: No  Information entered by :: Leroy Kennedy LPN   Activities of Daily Living    06/02/2022   10:42 AM  In your present state of health, do you have any difficulty performing the following activities:  Hearing? 0  Vision? 0  Difficulty concentrating or making decisions? 0  Walking or  climbing stairs? 0  Dressing or bathing? 0  Doing errands, shopping? 0  Preparing Food and eating ? N  Using the Toilet? N  In the past six months, have you accidently leaked urine? N  Do you have problems with loss of bowel control? N  Managing your Medications? N  Managing your Finances? N  Housekeeping or managing your Housekeeping? N    Patient Care Team: Venita Lick, NP as PCP - General (Nurse Practitioner) Hollice Espy, MD as Consulting Physician (Urology) Minor, Dalbert Garnet, RN (Inactive) as Major any recent Medical  Services you may have received from other than [facility] providers in the past year (date may be approximate).     Assessment:   This is a routine wellness examination for [place].  Hearing/Vision screen Hearing Screening - Comments:: No trouble hearing Vision Screening - Comments:: Not up to date   Dietary issues and exercise activities discussed: Current Exercise Habits: The patient does not participate in regular exercise at present   Goals Addressed             This Visit's Progress    Weight (lb) < 200 lb (90.7 kg)         Depression Screen    06/02/2022   10:40 AM 02/18/2022    8:45 AM 06/01/2021   10:33 AM 05/26/2020   10:35 AM 04/26/2019    1:05 PM 01/20/2018    9:11 AM 06/29/2017    9:13 AM  PHQ 2/9 Scores  PHQ - 2 Score 0 0 0 0 0 1 0  PHQ- 9 Score  0  0  4     Fall Risk    06/02/2022   10:35 AM 02/18/2022    8:45 AM 06/01/2021   10:33 AM 05/26/2020   10:35 AM 09/05/2019    5:21 PM  [place] in the past year? 0 0 0 0 0  Comment     Emmi Telephone Survey: data to providers prior to load  Number falls in past yr: 0 0     Injury with Fall? 0 0     Risk for fall due to :  No Fall Risks Medication side effect    Follow up Falls evaluation completed;Education provided;Falls prevention discussed Falls evaluation completed Falls evaluation completed;Education provided;Falls prevention  discussed      FALL RISK PREVENTION PERTAINING TO THE HOME:  Any stairs in or around the home? Yes  If so, are there any without handrails? No  Home free of loose throw rugs in walkways, pet beds, electrical cords, etc? Yes  Adequate lighting in your home to reduce risk of falls? Yes   ASSISTIVE DEVICES UTILIZED TO PREVENT FALLS:  Life alert? No  Use of a cane, walker or w/c? No  Grab bars in the bathroom? No  Shower chair or bench in shower? Yes  Elevated toilet seat or a handicapped toilet? Yes   TIMED UP AND GO:  Was the test performed? No .    Cognitive Function:        06/02/2022   10:37 AM 06/01/2021   10:36 AM 05/26/2020   10:38 AM 04/26/2019    1:08 PM 01/20/2018    9:14 AM  6CIT Screen  What Year? 0 points 0 points 0 points 0 points 0 points  What month? 0 points 0 points 0 points 0 points 0 points  What time? 0 points 0 points 0 points 0 points 0 points  Count back from 20 0 points 0 points 0 points 0 points 0 points  Months in reverse 0 points 0 points 0 points 0 points 0 points  Repeat phrase 0 points 2 points 2 points 0 points 2 points  Total Score 0 points 2 points 2 points 0 points 2 points    Immunizations Immunization History  Administered Date(s) Administered   Fluad Quad(high Dose 65+) 07/04/2020   Influenza, High Dose Seasonal PF 08/20/2016, 07/28/2018   Influenza,inj,Quad PF,6+ Mos 10/10/2015   Influenza-Unspecified 08/25/2018, 07/12/2019, 07/21/2021   Moderna SARS-COV2 Booster Vaccination 07/21/2021   Moderna Sars-Covid-2  Vaccination 11/22/2019, 12/20/2019, 08/11/2020, 02/09/2021   PFIZER(Purple Top)SARS-COV-2 Vaccination 01/19/2021   Pneumococcal Conjugate-13 01/15/2015   Pneumococcal Polysaccharide-23 01/16/2016   Td 06/20/2007   Zoster, Live 01/11/2014    TDAP status: Due, Education has been provided regarding the importance of this vaccine. Advised may receive this vaccine at local pharmacy or Health Dept. Aware to provide a copy of the  vaccination record if obtained from local pharmacy or Health Dept. Verbalized acceptance and understanding.  Flu Vaccine status: Up to date  Pneumococcal vaccine status: Up to date  Covid-19 vaccine status: Completed vaccines  Qualifies for Shingles Vaccine? Yes   Zostavax completed Yes   Shingrix Completed?: No.    Education has been provided regarding the importance of this vaccine. Patient has been advised to call insurance company to determine out of pocket expense if they have not yet received this vaccine. Advised may also receive vaccine at local pharmacy or Health Dept. Verbalized acceptance and understanding.  Screening Tests Health Maintenance  Topic Date Due   INFLUENZA VACCINE  05/11/2022   COVID-19 Vaccine (6 - Moderna risk series) 06/18/2022 (Originally 09/15/2021)   COLONOSCOPY (Pts 45-57yr Insurance coverage will need to be confirmed)  08/21/2022 (Originally 04/05/2021)   TETANUS/TDAP  08/21/2022 (Originally 06/19/2017)   Zoster Vaccines- Shingrix (1 of 2) 09/02/2022 (Originally 11/01/1969)   Pneumonia Vaccine 72 Years old  Completed   Hepatitis C Screening  Completed   HPV VACCINES  Aged Out    Health Maintenance  Health Maintenance Due  Topic Date Due   INFLUENZA VACCINE  05/11/2022    Colorectal cancer screening: Type of screening: Colonoscopy. Completed 2017. Repeat every 5 years     Patient will discuss with MD at visit.     Education provided.  Did not want Colonoscopy again  Lung Cancer Screening: (Low Dose CT Chest recommended if Age 72-80years, 30 pack-year currently smoking OR have quit w/in 15years.) does not qualify.   Lung Cancer Screening Referral:   Additional Screening:  Hepatitis C Screening: does not qualify; Completed 2021  Vision Screening: Recommended annual ophthalmology exams for early detection of glaucoma and other disorders of the eye. Is the patient up to date with their annual eye exam?  No  Who is the provider or what is the name  of the office in which the patient attends annual eye exams? Education provided on offices If pt is not established with a provider, would they like to be referred to a provider to establish care? No .   Dental Screening: Recommended annual dental exams for proper oral hygiene  Community Resource Referral / Chronic Care Management: CRR required this visit?  No   CCM required this visit?  No      Plan:     I have personally reviewed and noted the following in the patient's chart:   Medical and social history Use of alcohol, tobacco or illicit drugs  Current medications and supplements including opioid prescriptions. Patient is not currently taking opioid prescriptions. Functional ability and status Nutritional status Physical activity Advanced directives List of other physicians Hospitalizations, surgeries, and ER visits in previous 12 months Vitals Screenings to include cognitive, depression, and falls Referrals and appointments  In addition, I have reviewed and discussed with patient certain preventive protocols, quality metrics, and best practice recommendations. A written personalized care plan for preventive services as well as general preventive health recommendations were provided to patient.     JLeroy Kennedy LPN   [id number]  Nurse Notes:

## 2022-06-04 ENCOUNTER — Ambulatory Visit: Payer: Medicare Other

## 2022-08-21 NOTE — Patient Instructions (Signed)

## 2022-08-23 ENCOUNTER — Encounter: Payer: Self-pay | Admitting: Nurse Practitioner

## 2022-08-23 ENCOUNTER — Ambulatory Visit (INDEPENDENT_AMBULATORY_CARE_PROVIDER_SITE_OTHER): Payer: Medicare Other | Admitting: Nurse Practitioner

## 2022-08-23 VITALS — BP 138/76 | HR 85 | Temp 98.5°F | Ht 70.0 in | Wt 193.3 lb

## 2022-08-23 DIAGNOSIS — R7309 Other abnormal glucose: Secondary | ICD-10-CM | POA: Diagnosis not present

## 2022-08-23 DIAGNOSIS — D120 Benign neoplasm of cecum: Secondary | ICD-10-CM

## 2022-08-23 DIAGNOSIS — Z23 Encounter for immunization: Secondary | ICD-10-CM | POA: Diagnosis not present

## 2022-08-23 DIAGNOSIS — E782 Mixed hyperlipidemia: Secondary | ICD-10-CM

## 2022-08-23 DIAGNOSIS — E663 Overweight: Secondary | ICD-10-CM | POA: Diagnosis not present

## 2022-08-23 DIAGNOSIS — S41101A Unspecified open wound of right upper arm, initial encounter: Secondary | ICD-10-CM

## 2022-08-23 DIAGNOSIS — I10 Essential (primary) hypertension: Secondary | ICD-10-CM

## 2022-08-23 NOTE — Assessment & Plan Note (Signed)
Noted on past labs, focusing on diet changes and regular activity.  From 5.7% to 6.1% range -- continue current diet focus.  [place] lab today.

## 2022-08-23 NOTE — Assessment & Plan Note (Signed)
Discussed colonoscopy and recommended this -- he wishes to think about it.

## 2022-08-23 NOTE — Progress Notes (Signed)
BP 138/76   Pulse 85   Temp 98.5 F (36.9 C) (Oral)   Ht _0  (1.778 m)   Wt 193 lb 4.8 oz (87.7 kg)   SpO2 98%   BMI 27.74 kg/m    Subjective:    Patient ID: Jared Herrera, male    DOB: Aug 12, 1951, 70 y.o.   MRN: [id number]  HPI: Jared Herrera is a 71 y.o. male  Chief Complaint  Patient presents with   Hypertension   Hyperlipidemia   IFG   Prostate Cancer   HYPERTENSION / HYPERLIPIDEMIA Continues on Zestoretic for HTN and Atorvastatin 10 MG daily.  History of radiation treatment (seeds placed in 2018) for prostate cancer 5 years ago and had follow-up with Dr. Baruch Gouty on 07/20/21 for this.  Past smoker quit in 1985.  Complains of abrasion to right upper arm due to cut on nail at home, healing but not up to date on tetanus.  He is concerned about this. Satisfied with current treatment? yes Duration of hypertension: chronic BP monitoring frequency: not checking BP range:  BP medication side effects: no Duration of hyperlipidemia: chronic Cholesterol medication side effects: no Cholesterol supplements: none Medication compliance: good compliance Aspirin: yes Recent stressors: no Recurrent headaches: no Visual changes: no Palpitations: no Dyspnea: no Chest pain: no Lower extremity edema: no Dizzy/lightheaded: no  The ASCVD Risk score (Arnett DK, et al., 2019) failed to calculate for the following reasons:   The valid total cholesterol range is 130 to 320 mg/dL  IFG: Last A1c 5.7% May 2023.  Has been focused on diet. Polydipsia/polyuria: no Visual disturbance: no Chest pain: no Paresthesias: no  Relevant past medical, surgical, family and social history reviewed and updated as indicated. Interim medical history since our last visit reviewed. Allergies and medications reviewed and updated.  Review of Systems  Constitutional:  Negative for activity change, diaphoresis, fatigue and fever.  Respiratory:  Negative for cough, chest tightness,  shortness of breath and wheezing.   Cardiovascular:  Negative for chest pain, palpitations and leg swelling.  Gastrointestinal: Negative.   Endocrine: Negative for cold intolerance, heat intolerance, polydipsia, polyphagia and polyuria.  Neurological: Negative.   Psychiatric/Behavioral: Negative.     Per HPI unless specifically indicated above     Objective:    BP 138/76   Pulse 85   Temp 98.5 F (36.9 C) (Oral)   Ht _1  (1.778 m)   Wt 193 lb 4.8 oz (87.7 kg)   SpO2 98%   BMI 27.74 kg/m   Wt Readings from Last 3 Encounters:  08/23/22 193 lb 4.8 oz (87.7 kg)  02/18/22 193 lb 9.6 oz (87.8 kg)  08/21/21 196 lb 6.4 oz (89.1 kg)    Physical Exam Vitals and nursing note reviewed.  Constitutional:      General: He is awake. He is not in acute distress.    Appearance: He is well-developed and overweight.  HENT:     Head: Normocephalic and atraumatic.     Right Ear: Hearing normal. No drainage.     Left Ear: Hearing normal. No drainage.  Eyes:     General: Lids are normal.        Right eye: No discharge.        Left eye: No discharge.     Conjunctiva/sclera: Conjunctivae normal.     Pupils: Pupils are equal, round, and reactive to light.  Neck:     Vascular: No carotid bruit.  Cardiovascular:     Rate and  Rhythm: Normal rate and regular rhythm.     Heart sounds: Normal heart sounds, S1 normal and S2 normal. No murmur heard.    No gallop.  Pulmonary:     Effort: Pulmonary effort is normal. No accessory muscle usage or respiratory distress.     Breath sounds: Normal breath sounds.  Abdominal:     General: Bowel sounds are normal.     Palpations: Abdomen is soft.  Musculoskeletal:        General: Normal range of motion.     Cervical back: Normal range of motion and neck supple.     Right lower leg: No edema.     Left lower leg: No edema.  Skin:    General: Skin is warm and dry.  Neurological:     Mental Status: He is alert and oriented to person, place, and time.   Psychiatric:        Mood and Affect: Mood normal.        Behavior: Behavior normal. Behavior is cooperative.        Thought Content: Thought content normal.        Judgment: Judgment normal.    Results for orders placed or performed in visit on 02/18/22  Bayer DCA Hb A1c Waived  Result Value Ref Range   HB A1C (BAYER DCA - WAIVED) 5.7 (H) 4.8 - 5.6 %  Microalbumin, Urine Waived  Result Value Ref Range   Microalb, Ur Waived 30 (H) 0 - 19 mg/L   Creatinine, Urine Waived 300 10 - 300 mg/dL   Microalb/Creat Ratio <30 <30 mg/g  Basic metabolic panel  Result Value Ref Range   Glucose 102 (H) 70 - 99 mg/dL   BUN 17 8 - 27 mg/dL   Creatinine, Ser 0.74 (L) 0.76 - 1.27 mg/dL   eGFR 96 >59 mL/min/1.73   BUN/Creatinine Ratio 23 10 - 24   Sodium 140 134 - 144 mmol/L   Potassium 3.5 3.5 - 5.2 mmol/L   Chloride 100 96 - 106 mmol/L   CO2 23 20 - 29 mmol/L   Calcium 9.5 8.6 - 10.2 mg/dL  Lipid Panel w/o Chol/HDL Ratio  Result Value Ref Range   Cholesterol, Total 124 100 - 199 mg/dL   Triglycerides 57 0 - 149 mg/dL   HDL 44 >39 mg/dL   VLDL Cholesterol Cal 13 5 - 40 mg/dL   LDL Chol Calc (NIH) 67 0 - 99 mg/dL  CBC with Differential/Platelet  Result Value Ref Range   WBC 6.4 3.4 - 10.8 x10E3/uL   RBC 4.68 4.14 - 5.80 x10E6/uL   Hemoglobin 14.3 13.0 - 17.7 g/dL   Hematocrit 43.2 37.5 - 51.0 %   MCV 92 79 - 97 fL   MCH 30.6 26.6 - 33.0 pg   MCHC 33.1 31.5 - 35.7 g/dL   RDW 12.3 11.6 - 15.4 %   Platelets 248 150 - 450 x10E3/uL   Neutrophils 46 Not Estab. %   Lymphs 41 Not Estab. %   Monocytes 11 Not Estab. %   Eos 1 Not Estab. %   Basos 1 Not Estab. %   Neutrophils Absolute 3.0 1.4 - 7.0 x10E3/uL   Lymphocytes Absolute 2.6 0.7 - 3.1 x10E3/uL   Monocytes Absolute 0.7 0.1 - 0.9 x10E3/uL   EOS (ABSOLUTE) 0.1 0.0 - 0.4 x10E3/uL   Basophils Absolute 0.0 0.0 - 0.2 x10E3/uL   Immature Granulocytes 0 Not Estab. %   Immature Grans (Abs) 0.0 0.0 - 0.1 x10E3/uL  TSH  Result Value Ref  Range   TSH 1.030 0.450 - 4.500 uIU/mL  PSA  Result Value Ref Range   Prostate Specific Ag, Serum 0.2 0.0 - 4.0 ng/mL      Assessment & Plan:   Problem List Items Addressed This Visit       Cardiovascular and Mediastinum   Hypertension - Primary    Chronic, stable.  BP below goal for age in office today.  Recommend he monitor BP at least a few mornings a week at home and document.  DASH diet at home.  Continue current medication regimen and adjust as needed.  Labs today: BMP.  Refills up to date.  Return in 6 months.          Relevant Orders   Basic metabolic panel     Digestive   Benign neoplasm of cecum    Discussed colonoscopy and recommended this -- he wishes to think about it.        Other   Elevated hemoglobin A1c measurement    Noted on past labs, focusing on diet changes and regular activity.  From 5.7% to 6.1% range -- continue current diet focus.  [place] lab today.      Relevant Orders   HgB A1c   Hyperlipemia    Chronic, ongoing.  Continue current medication regimen and adjust dose as needed.  Lipid panel today.        Relevant Orders   Lipid Panel w/o Chol/HDL Ratio   Overweight    BMI 27.74.  Recommended eating smaller high protein, low fat meals more frequently and exercising 30 mins a day 5 times a week with a goal of 10-15lb weight loss in the next 3 months. Patient voiced their understanding and motivation to adhere to these recommendations.       Other Visit Diagnoses     Open wound of right upper extremity, initial encounter       Td in office today.  Wound healed.   Relevant Orders   Td vaccine greater than or equal to 7yo preservative free IM (Completed)   Flu vaccine need       High dose flu vaccine in office today.   Relevant Orders   Flu Vaccine QUAD High Dose(Fluad) (Completed)        Follow up plan: Return in about 6 months (around 02/21/2023) for HTN/HLD, PSA CANCER HISTORY, PREDIABETES.

## 2022-08-23 NOTE — Assessment & Plan Note (Signed)
Chronic, stable.  BP below goal for age in office today.  Recommend he monitor BP at least a few mornings a week at home and document.  DASH diet at home.  Continue current medication regimen and adjust as needed.  Labs today: BMP.  Refills up to date.  Return in 6 months.

## 2022-08-23 NOTE — Assessment & Plan Note (Signed)
BMI 27.74.  Recommended eating smaller high protein, low fat meals more frequently and exercising 30 mins a day 5 times a week with a goal of 10-15lb weight loss in the next 3 months. Patient voiced their understanding and motivation to adhere to these recommendations.

## 2022-08-23 NOTE — Assessment & Plan Note (Signed)
Chronic, ongoing.  Continue current medication regimen and adjust dose as needed.  Lipid panel today.

## 2022-08-24 LAB — BASIC METABOLIC PANEL
BUN/Creatinine Ratio: 18 (ref 10–24)
BUN: 15 mg/dL (ref 8–27)
CO2: 25 mmol/L (ref 20–29)
Calcium: 9.3 mg/dL (ref 8.6–10.2)
Chloride: 100 mmol/L (ref 96–106)
Creatinine, Ser: 0.84 mg/dL (ref 0.76–1.27)
Glucose: 96 mg/dL (ref 70–99)
Potassium: 3.7 mmol/L (ref 3.5–5.2)
Sodium: 141 mmol/L (ref 134–144)
eGFR: 93 mL/min/{1.73_m2} (ref >59)

## 2022-08-24 LAB — HEMOGLOBIN A1C
Est. average glucose Bld gHb Est-mCnc: 128 mg/dL
Hgb A1c MFr Bld: 6.1 % — ABNORMAL HIGH (ref 4.8–5.6)

## 2022-08-24 LAB — LIPID PANEL W/O CHOL/HDL RATIO
Cholesterol, Total: 120 mg/dL (ref 100–199)
HDL: 40 mg/dL (ref >39)
LDL Chol Calc (NIH): 67 mg/dL (ref 0–99)
Triglycerides: 62 mg/dL (ref 0–149)
VLDL Cholesterol Cal: 13 mg/dL (ref 5–40)

## 2022-08-24 NOTE — Progress Notes (Signed)
Contacted via [place] morning [place], your labs have returned: - Kidney function stable and cholesterol levels at goal -- continue current medications. - The A1C is the diabetes testing we talked about, this looks at your blood sugars over the past 3 months and turns the average into a number.  Your number is 6.1%, meaning you are prediabetic.  Any number 5.7 to 6.4 is considered prediabetes and any number 6.5 or greater is considered diabetes.   I would recommend heavy focus on decreasing foods high in sugar and your intake of things like bread products, pasta, and rice.  The American Diabetes Association online has a large amount of information on diet changes to make.  We will recheck this number in 6 months to ensure you are not continuing to trend upwards and move into diabetes.  Have a good day.  Any questions? Keep being amazing!!  Thank you for allowing me to participate in your care.  I appreciate you. Kindest regards, Leontae Bostock

## 2022-11-26 ENCOUNTER — Encounter: Payer: Self-pay | Admitting: Physician Assistant

## 2022-11-26 ENCOUNTER — Ambulatory Visit (INDEPENDENT_AMBULATORY_CARE_PROVIDER_SITE_OTHER): Payer: Medicare HMO | Admitting: Physician Assistant

## 2022-11-26 VITALS — BP 134/78 | HR 79 | Temp 98.3°F | Ht 70.0 in | Wt 189.0 lb

## 2022-11-26 DIAGNOSIS — J069 Acute upper respiratory infection, unspecified: Secondary | ICD-10-CM

## 2022-11-26 DIAGNOSIS — R051 Acute cough: Secondary | ICD-10-CM | POA: Diagnosis not present

## 2022-11-26 MED ORDER — BENZONATATE 100 MG PO CAPS: 100.0000 mg | ORAL_CAPSULE | Freq: Two times a day (BID) | ORAL | 0 refills | Status: DC | PRN

## 2022-11-26 NOTE — Patient Instructions (Signed)
Based on your described symptoms and the duration of symptoms it is likely that you have a viral upper respiratory infection (often called a "cold")  Symptoms can last for 3-10 days with lingering cough and intermittent symptoms lasting weeks after that.  The goal of treatment at this time is to reduce your symptoms and discomfort   I have sent in Tessalon pearls for you to take twice per day to help with your cough  You can use regular formulations of Mucinex and Robitussin to help with the congestion and coughing  You can continue to use Tylenol to help with body aches and to manage fever I also recommend using Flonase nasal spray as well as nasal saline sprays to help with the runny nose and congestion    If your symptoms do not improve or become worse in the next 5-7 days please make an apt at the office so we can see you  Go to the ER if you begin to have more serious symptoms such as shortness of breath, trouble breathing, loss of consciousness, swelling around the eyes, high fever, severe lasting headaches, vision changes or neck pain/stiffness.

## 2022-11-26 NOTE — Progress Notes (Unsigned)
Acute Office Visit   Patient: Jared Herrera   DOB: [birth year]   72 y.o. Male  MRN: [id number] Visit Date: 11/26/2022  Today's healthcare provider: Dani Gobble Balthazar Dooly, PA-C  Introduced myself to the patient as a Journalist, newspaper and provided education on APPs in clinical practice.    Chief Complaint  Patient presents with   Cough    Patient says he has been having cold symptoms for a little over a week. Patient says he has been taking Coricidin and says it helps and he has no cough, but in the mornings he is sweating and coughing.    Nasal Congestion   Dizziness   Subjective    Cough Associated symptoms include postnasal drip, rhinorrhea and a sore throat. Pertinent negatives include no chills, ear pain, fever, headaches, myalgias, shortness of breath or wheezing.  Dizziness Associated symptoms include congestion, coughing and a sore throat. Pertinent negatives include no arthralgias, chills, fatigue, fever, headaches, myalgias, nausea or vomiting.   HPI     Cough    Additional comments: Patient says he has been having cold symptoms for a little over a week. Patient says he has been taking Coricidin and says it helps and he has no cough, but in the mornings he is sweating and coughing.       Last edited by Irena Reichmann, CMA on 11/26/2022  9:31 AM.       Jared Herrera symptoms  Onset: gradual  Duration: almost a week   Associated symptoms: post nasal drainage, scratchy throat, mild productive cough, some diaphoresis but unsure if he had fever a few days ago  Reports he had some mild diarrhea and gas since getting sick    Interventions: Coricidin provides some relief but only lasts about 4 hours   Recent sick contacts: he reports his granddaughter and his daughter have been sick with similar symptoms       Medications: Outpatient Medications Prior to Visit  Medication Sig   Ascorbic Acid (VITAMIN C) 1000 MG tablet Take 1,000 mg by mouth daily.   aspirin 81 MG tablet  Take 81 mg by mouth daily. Am, Reported on 11/11/2015   atorvastatin (LIPITOR) 10 MG tablet Take 1 tablet (10 mg total) by mouth daily.   Cholecalciferol (VITAMIN D3 PO) Take 1,000 Units by mouth daily.   lisinopril-hydrochlorothiazide (ZESTORETIC) 20-25 MG tablet Take 1 tablet by mouth daily.   Multiple Vitamin (MULTIVITAMIN) tablet Take 1 tablet by mouth daily.   vitamin B-12 (CYANOCOBALAMIN) 1000 MCG tablet Take 1,000 mcg by mouth daily.   No facility-administered medications prior to visit.    Review of Systems  Constitutional:  Negative for chills, fatigue and fever.  HENT:  Positive for congestion, postnasal drip, rhinorrhea, sinus pressure and sore throat. Negative for ear pain.   Respiratory:  Positive for cough. Negative for shortness of breath and wheezing.   Gastrointestinal:  Positive for diarrhea. Negative for nausea and vomiting.  Musculoskeletal:  Negative for arthralgias and myalgias.  Neurological:  Negative for dizziness, light-headedness and headaches.    {Labs  Heme  Chem  Endocrine  Serology  Results Review (optional):23779}   Objective    BP 134/78   Pulse 79   Temp 98.3 F (36.8 C) (Oral)   Ht 5' 10"$  (1.778 m)   Wt 189 lb (85.7 kg)   SpO2 97%   BMI 27.12 kg/m  {Show previous vital signs (optional):23777}  Physical Exam Vitals reviewed.  Constitutional:  General: He is awake.     Appearance: Normal appearance. He is well-developed and well-groomed.  HENT:     Head: Normocephalic and atraumatic.     Right Ear: Hearing, tympanic membrane and ear canal normal.     Left Ear: Hearing, tympanic membrane and ear canal normal.     Mouth/Throat:     Lips: Pink.     Mouth: Mucous membranes are moist.     Pharynx: Oropharynx is clear. Uvula midline. Posterior oropharyngeal erythema present. No oropharyngeal exudate.  Cardiovascular:     Rate and Rhythm: Normal rate and regular rhythm.     Pulses: Normal pulses.          Radial pulses are 2+ on the  right side and 2+ on the left side.     Heart sounds: Normal heart sounds. No murmur heard.    No friction rub. No gallop.  Pulmonary:     Effort: Pulmonary effort is normal.     Breath sounds: Normal breath sounds. No decreased air movement. No decreased breath sounds, wheezing, rhonchi or rales.  Musculoskeletal:     Cervical back: Normal range of motion.  Lymphadenopathy:     Head:     Right side of head: No submental, submandibular or preauricular adenopathy.     Left side of head: No submental, submandibular or preauricular adenopathy.     Cervical:     Right cervical: No superficial or posterior cervical adenopathy.    Left cervical: No superficial or posterior cervical adenopathy.  Skin:    General: Skin is warm.  Neurological:     General: No focal deficit present.     Mental Status: He is alert and oriented to person, place, and time. Mental status is at baseline.  Psychiatric:        Mood and Affect: Mood normal.        Behavior: Behavior normal. Behavior is cooperative.        Thought Content: Thought content normal.        Judgment: Judgment normal.       No results found for any visits on 11/26/22.  Assessment & Plan      No follow-ups on file.       Problem List Items Addressed This Visit   None Visit Diagnoses     Viral upper respiratory tract infection    -  Primary   Acute cough       Relevant Medications   benzonatate (TESSALON) 100 MG capsule        No follow-ups on file.   I, Locklan Canoy E Melida Northington, PA-C, have reviewed all documentation for this visit. The documentation on 11/26/22 for the exam, diagnosis, procedures, and orders are all accurate and complete.   Talitha Givens, MHS, PA-C [facility]

## 2022-12-13 ENCOUNTER — Emergency Department: Payer: Medicare HMO

## 2022-12-13 ENCOUNTER — Emergency Department
Admission: EM | Admit: 2022-12-13 | Discharge: 2022-12-13 | Disposition: A | Discharge status: Home / Self Care | Payer: Medicare HMO | Attending: Emergency Medicine | Admitting: Emergency Medicine

## 2022-12-13 DIAGNOSIS — I10 Essential (primary) hypertension: Secondary | ICD-10-CM | POA: Insufficient documentation

## 2022-12-13 DIAGNOSIS — J029 Acute pharyngitis, unspecified: Secondary | ICD-10-CM

## 2022-12-13 DIAGNOSIS — Z1152 Encounter for screening for COVID-19: Secondary | ICD-10-CM | POA: Insufficient documentation

## 2022-12-13 DIAGNOSIS — R070 Pain in throat: Secondary | ICD-10-CM | POA: Diagnosis not present

## 2022-12-13 DIAGNOSIS — R059 Cough, unspecified: Secondary | ICD-10-CM | POA: Diagnosis not present

## 2022-12-13 LAB — RESP PANEL BY RT-PCR (RSV, FLU A&B, COVID)  RVPGX2
Influenza A by PCR: NEGATIVE
Influenza B by PCR: NEGATIVE
Resp Syncytial Virus by PCR: NEGATIVE
SARS Coronavirus 2 by RT PCR: NEGATIVE

## 2022-12-13 LAB — GROUP A STREP BY PCR: Group A Strep by PCR: NOT DETECTED

## 2022-12-13 NOTE — ED Triage Notes (Signed)
Pt presents to the ED via POV due to foreign object in throat. Pt states he fell asleep with a toothpick in his mouth and woke up coughing. Since then pt states he has difficult in his throat. Pt states he can eat and drink. Pt denies dyspnea. Pt NAD in triage.

## 2022-12-13 NOTE — ED Provider Notes (Signed)
[facility] Provider Note    Event Date/Time   First MD Initiated Contact with Patient 12/13/22 2004     (approximate)  History   Chief Complaint: Swallowed Foreign Body  HPI  Jared Herrera is a 72 y.o. male with a past medical history of hypertension, hyperlipidemia presents to the emergency department for throat discomfort.  According to the patient for the past week or so he has been experiencing congestion and sore/scratchy throat.  Patient states this evening after dinner he was sitting in his chair with a toothpick in his mouth.  States he awoke to a lot of mucus in his throat which caused him to gag and he vomited.  Patient noted that he could not find the toothpick and is not sure if it fell out of his mouth or if he may have swallowed it.  Patient denies any sharp pains in his throat states that throat was sore over the past week but he felt like it was somewhat worse after he vomited.  Patient states if he could have found the toothpick he would not of come to the emergency department but he was concerned that he may have swallowed it.  Denies any abdominal pain.  Physical Exam   Triage Vital Signs: ED Triage Vitals  Enc Vitals Group     BP 12/13/22 1800 131/87     Pulse Rate 12/13/22 1800 (!) 115     Resp 12/13/22 1800 18     Temp 12/13/22 1800 98.2 F (36.8 C)     Temp Source 12/13/22 1800 Oral     SpO2 12/13/22 1800 98 %     Weight 12/13/22 1759 187 lb 6.3 oz (85 kg)     Height 12/13/22 1759 '5\' 10"'$  (1.778 m)     Head Circumference --      Peak Flow --      Pain Score 12/13/22 1759 0     Pain Loc --      Pain Edu? --      Excl. in [place]? --     Most recent vital signs: Vitals:   12/13/22 1800  BP: 131/87  Pulse: (!) 115  Resp: 18  Temp: 98.2 F (36.8 C)  SpO2: 98%    General: Awake, no distress.  CV:  Good peripheral perfusion.  Regular rate and rhythm  Resp:  Normal effort.  Equal breath sounds bilaterally.  Abd:  No  distention.  Soft, nontender.  No rebound or guarding. Other:  Mild pharyngeal erythema but no tonsillar exudates.   ED Results / Procedures / Treatments    MEDICATIONS ORDERED IN ED: Medications - No data to display  No foreign body seen on my evaluation and interpretation of the neck x-ray images.  IMPRESSION / MDM / ASSESSMENT AND PLAN / ED COURSE  I reviewed the triage vital signs and the nursing notes.  Patient's presentation is most consistent with acute presentation with potential threat to life or bodily function.  Patient presents emergency department for sore throat and possible swallowed toothpick.  Patient states he woke up to mucus in his throat and vomited and could not find the toothpick.  I highly doubt that the patient swallowed the toothpick by accident in his sleep given no throat sharp pains no abdominal pain.  Patient states that throat had been hurting him already and then he vomited now starting somewhat worse but states is more of a scratchy feeling denies any sharp pains.  States he  has been able to eat and drink since the event without issue.  Patient does have mild pharyngeal erythema on exam.  Will check a strep test as well as a COVID/flu/RSV swab.  Patient's x-ray of the neck shows no concerning findings, although would be unlikely to show a wood foreign body, however my suspicion is extremely low.  COVID/flu/RSV test as well as strep test are all negative.  Patient reassured by the workup.  Discussed return precautions for any abdominal pain vomiting unable to keep down fluids.  Patient agreeable to plan.  FINAL CLINICAL IMPRESSION(S) / ED DIAGNOSES   Pharyngitis  Note:  This document was prepared using Dragon voice recognition software and may include unintentional dictation errors.   Harvest Dark, MD 12/13/22 2226

## 2023-02-19 NOTE — Patient Instructions (Signed)
Be Involved in Your Health Care:  Taking Medications When medications are taken as directed, they can greatly improve your health. But if they are not taken as instructed, they may not work. In some cases, not taking them correctly can be harmful. To help ensure your treatment remains effective and safe, understand your medications and how to take them.  Your lab results, notes and after visit summary will be available on My Chart. We strongly encourage you to use this feature. If lab results are abnormal the clinic will contact you with the appropriate steps. If the clinic does not contact you assume the results are satisfactory. You can always see your results on My Chart. If you have questions regarding your condition, please contact the clinic during office hours. You can also ask questions on My Chart.  We at [facility] are grateful that you chose us to provide care. We strive to provide excellent and compassionate care and are always looking for feedback. If you get a survey from the clinic please complete this.   DASH Eating Plan DASH stands for Dietary Approaches to Stop Hypertension. The DASH eating plan is a healthy eating plan that has been shown to: Reduce high blood pressure (hypertension). Reduce your risk for type 2 diabetes, heart disease, and stroke. Help with weight loss. What are tips for following this plan? Reading food labels Check food labels for the amount of salt (sodium) per serving. Choose foods with less than 5 percent of the Daily Value of sodium. Generally, foods with less than 300 milligrams (mg) of sodium per serving fit into this eating plan. To find whole grains, look for the word "whole" as the first word in the ingredient list. Shopping Buy products labeled as "low-sodium" or "no salt added." Buy fresh foods. Avoid canned foods and pre-made or frozen meals. Cooking Avoid adding salt when cooking. Use salt-free seasonings or herbs instead of  table salt or sea salt. Check with your health care provider or pharmacist before using salt substitutes. Do not fry foods. Cook foods using healthy methods such as baking, boiling, grilling, roasting, and broiling instead. Cook with heart-healthy oils, such as olive, canola, avocado, soybean, or sunflower oil. Meal planning  Eat a balanced diet that includes: 4 or more servings of fruits and 4 or more servings of vegetables each day. Try to fill one-half of your plate with fruits and vegetables. 6-8 servings of whole grains each day. Less than 6 oz (170 g) of lean meat, poultry, or fish each day. A 3-oz (85-g) serving of meat is about the same size as a deck of cards. One egg equals 1 oz (28 g). 2-3 servings of low-fat dairy each day. One serving is 1 cup (237 mL). 1 serving of nuts, seeds, or beans 5 times each week. 2-3 servings of heart-healthy fats. Healthy fats called omega-3 fatty acids are found in foods such as walnuts, flaxseeds, fortified milks, and eggs. These fats are also found in cold-water fish, such as sardines, salmon, and mackerel. Limit how much you eat of: Canned or prepackaged foods. Food that is high in trans fat, such as some fried foods. Food that is high in saturated fat, such as fatty meat. Desserts and other sweets, sugary drinks, and other foods with added sugar. Full-fat dairy products. Do not salt foods before eating. Do not eat more than 4 egg yolks a week. Try to eat at least 2 vegetarian meals a week. Eat more home-cooked food and less restaurant,   buffet, and fast food. Lifestyle When eating at a restaurant, ask that your food be prepared with less salt or no salt, if possible. If you drink alcohol: Limit how much you use to: 0-1 drink a day for women who are not pregnant. 0-2 drinks a day for men. Be aware of how much alcohol is in your drink. In the [place] one drink equals one 12 oz bottle of beer (355 mL), one 5 oz glass of wine (148 mL), or one 1 oz  glass of hard liquor (44 mL). General information Avoid eating more than 2,300 mg of salt a day. If you have hypertension, you may need to reduce your sodium intake to 1,500 mg a day. Work with your health care provider to maintain a healthy body weight or to lose weight. Ask what an ideal weight is for you. Get at least 30 minutes of exercise that causes your heart to beat faster (aerobic exercise) most days of the week. Activities may include walking, swimming, or biking. Work with your health care provider or dietitian to adjust your eating plan to your individual calorie needs. What foods should I eat? Fruits All fresh, dried, or frozen fruit. Canned fruit in natural juice (without added sugar). Vegetables Fresh or frozen vegetables (raw, steamed, roasted, or grilled). Low-sodium or reduced-sodium tomato and vegetable juice. Low-sodium or reduced-sodium tomato sauce and tomato paste. Low-sodium or reduced-sodium canned vegetables. Grains Whole-grain or whole-wheat bread. Whole-grain or whole-wheat pasta. Brown rice. Oatmeal. Quinoa. Bulgur. Whole-grain and low-sodium cereals. Pita bread. Low-fat, low-sodium crackers. Whole-wheat flour tortillas. Meats and other proteins Skinless chicken or turkey. Ground chicken or turkey. Pork with fat trimmed off. Fish and seafood. Egg whites. Dried beans, peas, or lentils. Unsalted nuts, nut butters, and seeds. Unsalted canned beans. Lean cuts of beef with fat trimmed off. Low-sodium, lean precooked or cured meat, such as sausages or meat loaves. Dairy Low-fat (1%) or fat-free (skim) milk. Reduced-fat, low-fat, or fat-free cheeses. Nonfat, low-sodium ricotta or cottage cheese. Low-fat or nonfat yogurt. Low-fat, low-sodium cheese. Fats and oils Soft margarine without trans fats. Vegetable oil. Reduced-fat, low-fat, or light mayonnaise and salad dressings (reduced-sodium). Canola, safflower, olive, avocado, soybean, and sunflower oils. Avocado. Seasonings and  condiments Herbs. Spices. Seasoning mixes without salt. Other foods Unsalted popcorn and pretzels. Fat-free sweets. The items listed above may not be a complete list of foods and beverages you can eat. Contact a dietitian for more information. What foods should I avoid? Fruits Canned fruit in a light or heavy syrup. Fried fruit. Fruit in cream or butter sauce. Vegetables Creamed or fried vegetables. Vegetables in a cheese sauce. Regular canned vegetables (not low-sodium or reduced-sodium). Regular canned tomato sauce and paste (not low-sodium or reduced-sodium). Regular tomato and vegetable juice (not low-sodium or reduced-sodium). Pickles. Olives. Grains Baked goods made with fat, such as croissants, muffins, or some breads. Dry pasta or rice meal packs. Meats and other proteins Fatty cuts of meat. Ribs. Fried meat. Bacon. Bologna, salami, and other precooked or cured meats, such as sausages or meat loaves. Fat from the back of a pig (fatback). Bratwurst. Salted nuts and seeds. Canned beans with added salt. Canned or smoked fish. Whole eggs or egg yolks. Chicken or turkey with skin. Dairy Whole or 2% milk, cream, and half-and-half. Whole or full-fat cream cheese. Whole-fat or sweetened yogurt. Full-fat cheese. Nondairy creamers. Whipped toppings. Processed cheese and cheese spreads. Fats and oils Butter. Stick margarine. Lard. Shortening. Ghee. Bacon fat. Tropical oils, such as coconut, palm   kernel, or palm oil. Seasonings and condiments Onion salt, garlic salt, seasoned salt, table salt, and sea salt. Worcestershire sauce. Tartar sauce. Barbecue sauce. Teriyaki sauce. Soy sauce, including reduced-sodium. Steak sauce. Canned and packaged gravies. Fish sauce. Oyster sauce. Cocktail sauce. Store-bought horseradish. Ketchup. Mustard. Meat flavorings and tenderizers. Bouillon cubes. Hot sauces. Pre-made or packaged marinades. Pre-made or packaged taco seasonings. Relishes. Regular salad  dressings. Other foods Salted popcorn and pretzels. The items listed above may not be a complete list of foods and beverages you should avoid. Contact a dietitian for more information. Where to find more information National Heart, Lung, and Blood Institute: www.nhlbi.nih.gov American Heart Association: www.heart.org Academy of Nutrition and Dietetics: www.eatright.org National Kidney Foundation: www.kidney.org Summary The DASH eating plan is a healthy eating plan that has been shown to reduce high blood pressure (hypertension). It may also reduce your risk for type 2 diabetes, heart disease, and stroke. When on the DASH eating plan, aim to eat more fresh fruits and vegetables, whole grains, lean proteins, low-fat dairy, and heart-healthy fats. With the DASH eating plan, you should limit salt (sodium) intake to 2,300 mg a day. If you have hypertension, you may need to reduce your sodium intake to 1,500 mg a day. Work with your health care provider or dietitian to adjust your eating plan to your individual calorie needs. This information is not intended to replace advice given to you by your health care provider. Make sure you discuss any questions you have with your health care provider. Document Revised: 08/31/2019 Document Reviewed: 08/31/2019 Elsevier Patient Education  2023 Elsevier Inc.  

## 2023-02-21 ENCOUNTER — Ambulatory Visit (INDEPENDENT_AMBULATORY_CARE_PROVIDER_SITE_OTHER): Payer: Medicare HMO | Admitting: Nurse Practitioner

## 2023-02-21 ENCOUNTER — Encounter: Payer: Self-pay | Admitting: Nurse Practitioner

## 2023-02-21 VITALS — BP 118/71 | HR 71 | Temp 98.0°F | Ht 70.0 in | Wt 185.4 lb

## 2023-02-21 DIAGNOSIS — E663 Overweight: Secondary | ICD-10-CM | POA: Diagnosis not present

## 2023-02-21 DIAGNOSIS — R7309 Other abnormal glucose: Secondary | ICD-10-CM | POA: Diagnosis not present

## 2023-02-21 DIAGNOSIS — Z8546 Personal history of malignant neoplasm of prostate: Secondary | ICD-10-CM

## 2023-02-21 DIAGNOSIS — I10 Essential (primary) hypertension: Secondary | ICD-10-CM | POA: Diagnosis not present

## 2023-02-21 DIAGNOSIS — E782 Mixed hyperlipidemia: Secondary | ICD-10-CM

## 2023-02-21 LAB — MICROALBUMIN, URINE WAIVED
Creatinine, Urine Waived: 100 mg/dL (ref 10–300)
Microalb, Ur Waived: 10 mg/L (ref 0–19)
Microalb/Creat Ratio: <30 mg/g (ref <30)

## 2023-02-21 LAB — BAYER DCA HB A1C WAIVED: HB A1C (BAYER DCA - WAIVED): 5.9 % — ABNORMAL HIGH (ref 4.8–5.6)

## 2023-02-21 MED ORDER — LISINOPRIL-HYDROCHLOROTHIAZIDE 20-25 MG PO TABS: 1.0000 | ORAL_TABLET | Freq: Every day | ORAL | 4 refills | Status: DC

## 2023-02-21 MED ORDER — ATORVASTATIN CALCIUM 10 MG PO TABS: 10.0000 mg | ORAL_TABLET | Freq: Every day | ORAL | 4 refills | Status: DC

## 2023-02-21 NOTE — Assessment & Plan Note (Signed)
Chronic, ongoing.  Continue current medication regimen and adjust dose as needed.  Lipid panel today.   

## 2023-02-21 NOTE — Progress Notes (Signed)
BP 118/71   Pulse 71   Temp 98 F (36.7 C) (Oral)   Ht 5\' 10"  (1.778 m)   Wt 185 lb 6.4 oz (84.1 kg)   SpO2 98%   BMI 26.60 kg/m    Subjective:    Patient ID: Jared Herrera, male    DOB: 12-01-1950, 72 y.o.   MRN: [id number]  HPI: Jared Herrera is a 72 y.o. male  Chief Complaint  Patient presents with   Hypertension   Hyperlipidemia   Prediabetes   H/O PSA Cancer   HYPERTENSION / HYPERLIPIDEMIA Continues on Zestoretic for HTN and Atorvastatin daily.  Had radiation treatment (seeds placed in 2018) for prostate cancer >5 years ago and had follow-up with Dr. Rushie Chestnut on 07/20/21.  Past smoker quit in 1985. Satisfied with current treatment? yes Duration of hypertension: chronic BP monitoring frequency: not checking BP range:  BP medication side effects: no Duration of hyperlipidemia: chronic Cholesterol medication side effects: no Cholesterol supplements: none Medication compliance: good compliance Aspirin: yes Recent stressors: no Recurrent headaches: no Visual changes: no Palpitations: no Dyspnea: no Chest pain: no Lower extremity edema: no Dizzy/lightheaded: no   IFG: Last A1c 6.1% November 2023.  Has been focused on diet. Polydipsia/polyuria: no Visual disturbance: no Chest pain: no Paresthesias: no  Relevant past medical, surgical, family and social history reviewed and updated as indicated. Interim medical history since our last visit reviewed. Allergies and medications reviewed and updated.  Review of Systems  Constitutional:  Negative for activity change, diaphoresis, fatigue and fever.  Respiratory:  Negative for cough, chest tightness, shortness of breath and wheezing.   Cardiovascular:  Negative for chest pain, palpitations and leg swelling.  Gastrointestinal: Negative.   Endocrine: Negative for cold intolerance, heat intolerance, polydipsia, polyphagia and polyuria.  Neurological: Negative.   Psychiatric/Behavioral: Negative.      Per HPI unless specifically indicated above     Objective:    BP 118/71   Pulse 71   Temp 98 F (36.7 C) (Oral)   Ht 5\' 10"  (1.778 m)   Wt 185 lb 6.4 oz (84.1 kg)   SpO2 98%   BMI 26.60 kg/m   Wt Readings from Last 3 Encounters:  02/21/23 185 lb 6.4 oz (84.1 kg)  12/13/22 187 lb 6.3 oz (85 kg)  11/26/22 189 lb (85.7 kg)    Physical Exam Vitals and nursing note reviewed.  Constitutional:      General: He is awake. He is not in acute distress.    Appearance: He is well-developed and overweight.  HENT:     Head: Normocephalic and atraumatic.     Right Ear: Hearing normal. No drainage.     Left Ear: Hearing normal. No drainage.  Eyes:     General: Lids are normal.        Right eye: No discharge.        Left eye: No discharge.     Conjunctiva/sclera: Conjunctivae normal.     Pupils: Pupils are equal, round, and reactive to light.  Neck:     Vascular: No carotid bruit.  Cardiovascular:     Rate and Rhythm: Normal rate and regular rhythm.     Heart sounds: Normal heart sounds, S1 normal and S2 normal. No murmur heard.    No gallop.  Pulmonary:     Effort: Pulmonary effort is normal. No accessory muscle usage or respiratory distress.     Breath sounds: Normal breath sounds.  Abdominal:     General: Bowel  sounds are normal.     Palpations: Abdomen is soft.  Musculoskeletal:        General: Normal range of motion.     Cervical back: Normal range of motion and neck supple.     Right lower leg: No edema.     Left lower leg: No edema.  Skin:    General: Skin is warm and dry.  Neurological:     Mental Status: He is alert and oriented to person, place, and time.  Psychiatric:        Mood and Affect: Mood normal.        Behavior: Behavior normal. Behavior is cooperative.        Thought Content: Thought content normal.        Judgment: Judgment normal.    Results for orders placed or performed during the hospital encounter of 12/13/22  Resp panel by RT-PCR (RSV, Flu  A&B, Covid) Anterior Nasal Swab   Specimen: Anterior Nasal Swab  Result Value Ref Range   SARS Coronavirus 2 by RT PCR NEGATIVE NEGATIVE   Influenza A by PCR NEGATIVE NEGATIVE   Influenza B by PCR NEGATIVE NEGATIVE   Resp Syncytial Virus by PCR NEGATIVE NEGATIVE  Group A Strep by PCR ([facility] Only)   Specimen: Anterior Nasal Swab; Sterile Swab  Result Value Ref Range   Group A Strep by PCR NOT DETECTED NOT DETECTED      Assessment & Plan:   Problem List Items Addressed This Visit       Cardiovascular and Mediastinum   Hypertension - Primary    Chronic, stable.  BP below goal for age in office today.  Recommend he monitor BP at least a few mornings a week at home and document.  DASH diet at home.  Continue current medication regimen and adjust as needed.  Labs today: CBC, CMP, TSH, urine ALB.  Refills sent.  Return in 6 months.          Relevant Medications   lisinopril-hydrochlorothiazide (ZESTORETIC) 20-25 MG tablet   atorvastatin (LIPITOR) 10 MG tablet   Other Relevant Orders   CBC with Differential/Platelet   Comprehensive metabolic panel   TSH     Other   Elevated hemoglobin A1c measurement    Noted on past labs, focusing on diet changes and regular activity.  From 5.7% to 6.1% range -- continue current diet focus.  Recheck lab today.      Relevant Orders   Microalbumin, Urine Waived   Bayer DCA Hb A1c Waived   History of prostate cancer    Stable -- followed by oncology in past and to return as needed.  PSA on labs today.      Relevant Orders   PSA   Hyperlipemia    Chronic, ongoing.  Continue current medication regimen and adjust dose as needed.  Lipid panel today.        Relevant Medications   lisinopril-hydrochlorothiazide (ZESTORETIC) 20-25 MG tablet   atorvastatin (LIPITOR) 10 MG tablet   Other Relevant Orders   Comprehensive metabolic panel   Lipid Panel w/o Chol/HDL Ratio   Overweight    BMI 26.60, is losing weight.  Recommended eating smaller high  protein, low fat meals more frequently and exercising 30 mins a day 5 times a week with a goal of 10-15lb weight loss in the next 3 months. Patient voiced their understanding and motivation to adhere to these recommendations.         Follow up plan: Return in about  6 months (around 08/24/2023) for HTN/HLD, H/O Prostate Cancer, IFG.

## 2023-02-21 NOTE — Assessment & Plan Note (Signed)
Noted on past labs, focusing on diet changes and regular activity.  From 5.7% to 6.1% range -- continue current diet focus.  Recheck lab today. 

## 2023-02-21 NOTE — Assessment & Plan Note (Signed)
Chronic, stable.  BP below goal for age in office today.  Recommend he monitor BP at least a few mornings a week at home and document.  DASH diet at home.  Continue current medication regimen and adjust as needed.  Labs today: CBC, CMP, TSH, urine ALB.  Refills sent.  Return in 6 months.

## 2023-02-21 NOTE — Assessment & Plan Note (Signed)
Stable -- followed by oncology in past and to return as needed.  PSA on labs today.

## 2023-02-21 NOTE — Assessment & Plan Note (Signed)
BMI 26.60, is losing weight.  Recommended eating smaller high protein, low fat meals more frequently and exercising 30 mins a day 5 times a week with a goal of 10-15lb weight loss in the next 3 months. Patient voiced their understanding and motivation to adhere to these recommendations.

## 2023-02-22 LAB — CBC WITH DIFFERENTIAL/PLATELET
Basophils Absolute: 0 10*3/uL (ref 0.0–0.2)
Basos: 1 %
EOS (ABSOLUTE): 0 10*3/uL (ref 0.0–0.4)
Eos: 1 %
Hematocrit: 43.5 % (ref 37.5–51.0)
Hemoglobin: 14.1 g/dL (ref 13.0–17.7)
Immature Grans (Abs): 0 10*3/uL (ref 0.0–0.1)
Immature Granulocytes: 0 %
Lymphocytes Absolute: 2 10*3/uL (ref 0.7–3.1)
Lymphs: 34 %
MCH: 30.8 pg (ref 26.6–33.0)
MCHC: 32.4 g/dL (ref 31.5–35.7)
MCV: 95 fL (ref 79–97)
Monocytes Absolute: 0.6 10*3/uL (ref 0.1–0.9)
Monocytes: 10 %
Neutrophils Absolute: 3.4 10*3/uL (ref 1.4–7.0)
Neutrophils: 54 %
Platelets: 272 10*3/uL (ref 150–450)
RBC: 4.58 x10E6/uL (ref 4.14–5.80)
RDW: 12.1 % (ref 11.6–15.4)
WBC: 6.1 10*3/uL (ref 3.4–10.8)

## 2023-02-22 LAB — COMPREHENSIVE METABOLIC PANEL
ALT: 19 IU/L (ref 0–44)
AST: 19 IU/L (ref 0–40)
Albumin/Globulin Ratio: 1.3 (ref 1.2–2.2)
Albumin: 4.1 g/dL (ref 3.8–4.8)
Alkaline Phosphatase: 83 IU/L (ref 44–121)
BUN/Creatinine Ratio: 18 (ref 10–24)
BUN: 14 mg/dL (ref 8–27)
Bilirubin Total: 0.9 mg/dL (ref 0.0–1.2)
CO2: 26 mmol/L (ref 20–29)
Calcium: 9.5 mg/dL (ref 8.6–10.2)
Chloride: 98 mmol/L (ref 96–106)
Creatinine, Ser: 0.79 mg/dL (ref 0.76–1.27)
Globulin, Total: 3.2 g/dL (ref 1.5–4.5)
Glucose: 97 mg/dL (ref 70–99)
Potassium: 4.7 mmol/L (ref 3.5–5.2)
Sodium: 137 mmol/L (ref 134–144)
Total Protein: 7.3 g/dL (ref 6.0–8.5)
eGFR: 94 mL/min/{1.73_m2} (ref >59)

## 2023-02-22 LAB — LIPID PANEL W/O CHOL/HDL RATIO
Cholesterol, Total: 124 mg/dL (ref 100–199)
HDL: 47 mg/dL (ref >39)
LDL Chol Calc (NIH): 64 mg/dL (ref 0–99)
Triglycerides: 60 mg/dL (ref 0–149)
VLDL Cholesterol Cal: 13 mg/dL (ref 5–40)

## 2023-02-22 LAB — TSH: TSH: 1.32 u[IU]/mL (ref 0.450–4.500)

## 2023-02-22 LAB — PSA: Prostate Specific Ag, Serum: <0.1 ng/mL (ref 0.0–4.0)

## 2023-02-22 NOTE — Progress Notes (Signed)
Contacted via MyChart   Good evening Jared Herrera, your labs have returned and everything looks fantastic!!!  No medication changes needed.  Great job!!  Any questions? Keep being amazing!!  Thank you for allowing me to participate in your care.  I appreciate you. Kindest regards, Vilda Zollner

## 2023-06-15 ENCOUNTER — Telehealth: Payer: Self-pay | Admitting: Nurse Practitioner

## 2023-06-15 NOTE — Telephone Encounter (Signed)
Copied from CRM [id number]. Topic: Medicare AWV >> Jun 15, 2023  2:07 PM Payton Doughty wrote: Reason for CRM: LM 06/15/2023 to schedule AWV   Verlee Rossetti; Care Guide Ambulatory Clinical Support [place] [facility] Direct Dial: [phone]

## 2023-07-25 ENCOUNTER — Other Ambulatory Visit: Payer: Self-pay | Admitting: Nurse Practitioner

## 2023-07-26 NOTE — Telephone Encounter (Signed)
Rx 02/21/23 #90 4RF- 1 year supply Requested Prescriptions  Pending Prescriptions Disp Refills   atorvastatin (LIPITOR) 10 MG tablet [Pharmacy Med Name: ATORVASTATIN 10 MG TABLET] 90 tablet 0    Sig: Take 1 tablet (10 mg total) by mouth daily.     Cardiovascular:  Antilipid - Statins Failed - 07/25/2023  1:04 PM      Failed - Lipid Panel in normal range within the last 12 months    Cholesterol, Total  Date Value Ref Range Status  02/21/2023 124 100 - 199 mg/dL Final   Cholesterol Piccolo, Waived  Date Value Ref Range Status  05/07/2019 121 <200 mg/dL Final    Comment:                            Desirable                <200                         Borderline High      200- 239                         High                     >239    LDL Chol Calc (NIH)  Date Value Ref Range Status  02/21/2023 64 0 - 99 mg/dL Final   HDL  Date Value Ref Range Status  02/21/2023 47 >39 mg/dL Final   Triglycerides  Date Value Ref Range Status  02/21/2023 60 0 - 149 mg/dL Final   Triglycerides Piccolo,Waived  Date Value Ref Range Status  05/07/2019 61 <150 mg/dL Final    Comment:                            Normal                   <150                         Borderline High     150 - 199                         High                200 - 499                         Very High                >499          Passed - Patient is not pregnant      Passed - Valid encounter within last 12 months    Recent Outpatient Visits           5 months ago Primary hypertension   [place] [facility] [place], Century T, NP   8 months ago Viral upper respiratory tract infection   [place] [facility] [place], Oswaldo Conroy, PA-C   11 months ago Primary hypertension   [place] [facility] [place], Hugo T, NP   1 year ago Primary hypertension   [place] [facility] [place], Corrie Dandy T, NP   2 years ago Primary  hypertension   [place] [facility] [place], Dorie Rank, NP       Future Appointments             In 1 month [place], Dorie Rank, NP [place] [facility], [facility]

## 2023-08-21 NOTE — Patient Instructions (Signed)
Be Involved in Caring For Your Health:  Taking Medications When medications are taken as directed, they can greatly improve your health. But if they are not taken as prescribed, they may not work. In some cases, not taking them correctly can be harmful. To help ensure your treatment remains effective and safe, understand your medications and how to take them. Bring your medications to each visit for review by your provider.  Your lab results, notes, and after visit summary will be available on My Chart. We strongly encourage you to use this feature. If lab results are abnormal the clinic will contact you with the appropriate steps. If the clinic does not contact you assume the results are satisfactory. You can always view your results on My Chart. If you have questions regarding your health or results, please contact the clinic during office hours. You can also ask questions on My Chart.  We at [facility] are grateful that you chose us to provide your care. We strive to provide evidence-based and compassionate care and are always looking for feedback. If you get a survey from the clinic please complete this so we can hear your opinions.  DASH Eating Plan DASH stands for Dietary Approaches to Stop Hypertension. The DASH eating plan is a healthy eating plan that has been shown to: Lower high blood pressure (hypertension). Reduce your risk for type 2 diabetes, heart disease, and stroke. Help with weight loss. What are tips for following this plan? Reading food labels Check food labels for the amount of salt (sodium) per serving. Choose foods with less than 5 percent of the Daily Value (DV) of sodium. In general, foods with less than 300 milligrams (mg) of sodium per serving fit into this eating plan. To find whole grains, look for the word "whole" as the first word in the ingredient list. Shopping Buy products labeled as "low-sodium" or "no salt added." Buy fresh foods. Avoid canned  foods and pre-made or frozen meals. Cooking Try not to add salt when you cook. Use salt-free seasonings or herbs instead of table salt or sea salt. Check with your health care provider or pharmacist before using salt substitutes. Do not fry foods. Cook foods in healthy ways, such as baking, boiling, grilling, roasting, or broiling. Cook using oils that are good for your heart. These include olive, canola, avocado, soybean, and sunflower oil. Meal planning  Eat a balanced diet. This should include: 4 or more servings of fruits and 4 or more servings of vegetables each day. Try to fill half of your plate with fruits and vegetables. 6-8 servings of whole grains each day. 6 or less servings of lean meat, poultry, or fish each day. 1 oz is 1 serving. A 3 oz (85 g) serving of meat is about the same size as the palm of your hand. One egg is 1 oz (28 g). 2-3 servings of low-fat dairy each day. One serving is 1 cup (237 mL). 1 serving of nuts, seeds, or beans 5 times each week. 2-3 servings of heart-healthy fats. Healthy fats called omega-3 fatty acids are found in foods such as walnuts, flaxseeds, fortified milks, and eggs. These fats are also found in cold-water fish, such as sardines, salmon, and mackerel. Limit how much you eat of: Canned or prepackaged foods. Food that is high in trans fat, such as fried foods. Food that is high in saturated fat, such as fatty meat. Desserts and other sweets, sugary drinks, and other foods with added sugar. Full-fat   dairy products. Do not salt foods before eating. Do not eat more than 4 egg yolks a week. Try to eat at least 2 vegetarian meals a week. Eat more home-cooked food and less restaurant, buffet, and fast food. Lifestyle When eating at a restaurant, ask if your food can be made with less salt or no salt. If you drink alcohol: Limit how much you have to: 0-1 drink a day if you are male. 0-2 drinks a day if you are male. Know how much alcohol is in  your drink. In the [place] one drink is one 12 oz bottle of beer (355 mL), one 5 oz glass of wine (148 mL), or one 1 oz glass of hard liquor (44 mL). General information Avoid eating more than 2,300 mg of salt a day. If you have hypertension, you may need to reduce your sodium intake to 1,500 mg a day. Work with your provider to stay at a healthy body weight or lose weight. Ask what the best weight range is for you. On most days of the week, get at least 30 minutes of exercise that causes your heart to beat faster. This may include walking, swimming, or biking. Work with your provider or dietitian to adjust your eating plan to meet your specific calorie needs. What foods should I eat? Fruits All fresh, dried, or frozen fruit. Canned fruits that are in their natural juice and do not have sugar added to them. Vegetables Fresh or frozen vegetables that are raw, steamed, roasted, or grilled. Low-sodium or reduced-sodium tomato and vegetable juice. Low-sodium or reduced-sodium tomato sauce and tomato paste. Low-sodium or reduced-sodium canned vegetables. Grains Whole-grain or whole-wheat bread. Whole-grain or whole-wheat pasta. Brown rice. Oatmeal. Quinoa. Bulgur. Whole-grain and low-sodium cereals. Pita bread. Low-fat, low-sodium crackers. Whole-wheat flour tortillas. Meats and other proteins Skinless chicken or turkey. Ground chicken or turkey. Pork with fat trimmed off. Fish and seafood. Egg whites. Dried beans, peas, or lentils. Unsalted nuts, nut butters, and seeds. Unsalted canned beans. Lean cuts of beef with fat trimmed off. Low-sodium, lean precooked or cured meat, such as sausages or meat loaves. Dairy Low-fat (1%) or fat-free (skim) milk. Reduced-fat, low-fat, or fat-free cheeses. Nonfat, low-sodium ricotta or cottage cheese. Low-fat or nonfat yogurt. Low-fat, low-sodium cheese. Fats and oils Soft margarine without trans fats. Vegetable oil. Reduced-fat, low-fat, or light mayonnaise and salad  dressings (reduced-sodium). Canola, safflower, olive, avocado, soybean, and sunflower oils. Avocado. Seasonings and condiments Herbs. Spices. Seasoning mixes without salt. Other foods Unsalted popcorn and pretzels. Fat-free sweets. The items listed above may not be all the foods and drinks you can have. Talk to a dietitian to learn more. What foods should I avoid? Fruits Canned fruit in a light or heavy syrup. Fried fruit. Fruit in cream or butter sauce. Vegetables Creamed or fried vegetables. Vegetables in a cheese sauce. Regular canned vegetables that are not marked as low-sodium or reduced-sodium. Regular canned tomato sauce and paste that are not marked as low-sodium or reduced-sodium. Regular tomato and vegetable juices that are not marked as low-sodium or reduced-sodium. Pickles. Olives. Grains Baked goods made with fat, such as croissants, muffins, or some breads. Dry pasta or rice meal packs. Meats and other proteins Fatty cuts of meat. Ribs. Fried meat. Bacon. Bologna, salami, and other precooked or cured meats, such as sausages or meat loaves, that are not lean and low in sodium. Fat from the back of a pig (fatback). Bratwurst. Salted nuts and seeds. Canned beans with added salt. Canned   or smoked fish. Whole eggs or egg yolks. Chicken or turkey with skin. Dairy Whole or 2% milk, cream, and half-and-half. Whole or full-fat cream cheese. Whole-fat or sweetened yogurt. Full-fat cheese. Nondairy creamers. Whipped toppings. Processed cheese and cheese spreads. Fats and oils Butter. Stick margarine. Lard. Shortening. Ghee. Bacon fat. Tropical oils, such as coconut, palm kernel, or palm oil. Seasonings and condiments Onion salt, garlic salt, seasoned salt, table salt, and sea salt. Worcestershire sauce. Tartar sauce. Barbecue sauce. Teriyaki sauce. Soy sauce, including reduced-sodium soy sauce. Steak sauce. Canned and packaged gravies. Fish sauce. Oyster sauce. Cocktail sauce. Store-bought  horseradish. Ketchup. Mustard. Meat flavorings and tenderizers. Bouillon cubes. Hot sauces. Pre-made or packaged marinades. Pre-made or packaged taco seasonings. Relishes. Regular salad dressings. Other foods Salted popcorn and pretzels. The items listed above may not be all the foods and drinks you should avoid. Talk to a dietitian to learn more. Where to find more information National Heart, Lung, and Blood Institute (NHLBI): nhlbi.nih.gov American Heart Association (AHA): heart.org Academy of Nutrition and Dietetics: eatright.org National Kidney Foundation (NKF): kidney.org This information is not intended to replace advice given to you by your health care provider. Make sure you discuss any questions you have with your health care provider. Document Revised: 10/14/2022 Document Reviewed: 10/14/2022 Elsevier Patient Education  2024 [employer]  

## 2023-08-26 ENCOUNTER — Ambulatory Visit (INDEPENDENT_AMBULATORY_CARE_PROVIDER_SITE_OTHER): Payer: Medicare HMO | Admitting: Nurse Practitioner

## 2023-08-26 ENCOUNTER — Encounter: Payer: Self-pay | Admitting: Nurse Practitioner

## 2023-08-26 VITALS — BP 123/72 | HR 92 | Temp 98.3°F | Ht 70.0 in | Wt 187.8 lb

## 2023-08-26 DIAGNOSIS — I10 Essential (primary) hypertension: Secondary | ICD-10-CM

## 2023-08-26 DIAGNOSIS — E785 Hyperlipidemia, unspecified: Secondary | ICD-10-CM

## 2023-08-26 DIAGNOSIS — E782 Mixed hyperlipidemia: Secondary | ICD-10-CM

## 2023-08-26 DIAGNOSIS — Z0000 Encounter for general adult medical examination without abnormal findings: Secondary | ICD-10-CM | POA: Diagnosis not present

## 2023-08-26 DIAGNOSIS — Z7189 Other specified counseling: Secondary | ICD-10-CM | POA: Diagnosis not present

## 2023-08-26 DIAGNOSIS — E663 Overweight: Secondary | ICD-10-CM | POA: Diagnosis not present

## 2023-08-26 DIAGNOSIS — Z8546 Personal history of malignant neoplasm of prostate: Secondary | ICD-10-CM

## 2023-08-26 DIAGNOSIS — R7309 Other abnormal glucose: Secondary | ICD-10-CM | POA: Diagnosis not present

## 2023-08-26 DIAGNOSIS — Z6826 Body mass index (BMI) 26.0-26.9, adult: Secondary | ICD-10-CM | POA: Diagnosis not present

## 2023-08-26 NOTE — Assessment & Plan Note (Signed)
Stable -- followed by oncology in past and to return as needed.  PSA up to date.

## 2023-08-26 NOTE — Assessment & Plan Note (Signed)
A voluntary discussion about advance care planning including the explanation and discussion of advance directives was extensively discussed  with the patient for 15 minutes with patient present.  Explanation about the health care proxy and Living will was reviewed and packet with forms with explanation of how to fill them out was given.  During this discussion, the patient was able to identify a health care proxy as unknown and plans to fill out the paperwork required.  Patient was offered a separate Advance Care Planning visit for further assistance with forms.

## 2023-08-26 NOTE — Assessment & Plan Note (Signed)
BMI 26.95.  Recommended eating smaller high protein, low fat meals more frequently and exercising 30 mins a day 5 times a week with a goal of 10-15lb weight loss in the next 3 months. Patient voiced their understanding and motivation to adhere to these recommendations.

## 2023-08-26 NOTE — Progress Notes (Signed)
BP 123/72   Pulse 92   Temp 98.3 F (36.8 C) (Oral)   Ht 5\' 10"  (1.778 m)   Wt 187 lb 12.8 oz (85.2 kg)   SpO2 96%   BMI 26.95 kg/m    Subjective:    Patient ID: Jared Herrera, male    DOB: 1951/01/29, 72 y.o.   MRN: [id number]  HPI: [name] is a 72 y.o. male presenting on 08/26/2023 for Medicare Wellness and Follow-up visit. Current medical complaints include:none  He currently lives with: self Interim Problems from his last visit: no  HYPERTENSION / HYPERLIPIDEMIA Continues on Zestoretic for HTN and Atorvastatin daily.   Had radiation treatment (seeds placed in 2018) for prostate cancer >5 years ago and had follow-up with Dr. Rushie Chestnut on 07/20/21.  Past smoker quit in 1985. Satisfied with current treatment? yes Duration of hypertension: chronic BP monitoring frequency: not checking BP range:  BP medication side effects: no Duration of hyperlipidemia: chronic Cholesterol medication side effects: no Cholesterol supplements: none Medication compliance: good compliance Aspirin: yes Recent stressors: no Recurrent headaches: no Visual changes: no Palpitations: no Dyspnea: no Chest pain: no Lower extremity edema: no Dizzy/lightheaded: no   Impaired Fasting Glucose Continues to focus on diet. HbA1C:  Lab Results  Component Value Date   HGBA1C 5.9 (H) 02/21/2023  Duration of elevated blood sugar:  Polydipsia: no Polyuria: no Weight change: no Visual disturbance: no Glucose Monitoring: no    Accucheck frequency: Not Checking    Fasting glucose:     Post prandial:  Diabetic Education: Not Completed Family history of diabetes: yes on father's side  Functional Status Survey: Is the patient deaf or have difficulty hearing?: No Does the patient have difficulty seeing, even when wearing glasses/contacts?: No Does the patient have difficulty concentrating, remembering, or making decisions?: No Does the patient have difficulty walking or climbing  stairs?: No Does the patient have difficulty dressing or bathing?: No Does the patient have difficulty doing errands alone such as visiting a doctor's office or shopping?: No  FALL RISK:    08/26/2023    8:38 AM 02/21/2023    8:15 AM 11/26/2022    9:34 AM 08/23/2022    8:36 AM 06/02/2022   10:35 AM  Fall Risk   Falls in the past year? 0 1 0 1 0  Number falls in past yr: 0 0 0 1 0  Injury with Fall? 0 0 0 0 0  Risk for fall due to : No Fall Risks History of fall(s) No Fall Risks Other (Comment)   Follow up Falls evaluation completed Falls evaluation completed Falls evaluation completed Falls evaluation completed Falls evaluation completed;Education provided;Falls prevention discussed   Depression Screen    08/26/2023    8:38 AM 02/21/2023    8:15 AM 11/26/2022    9:34 AM 06/02/2022   10:40 AM 02/18/2022    8:45 AM  Depression screen PHQ 2/9  Decreased Interest 0 0 0 0 0  Down, Depressed, Hopeless 0 0 0 0 0  PHQ - 2 Score 0 0 0 0 0  Altered sleeping 2 0 2  0  Tired, decreased energy 0 0 0  0  Change in appetite 0 0 2  0  Feeling bad or failure about yourself  0 0 0  0  Trouble concentrating 0 0 0  0  Moving slowly or fidgety/restless 0 0 0  0  Suicidal thoughts 0 0 0  0  PHQ-9 Score 2 0 4  0  Difficult doing work/chores Not difficult at all Not difficult at all Not difficult at all        08/26/2023    8:38 AM 02/21/2023    8:15 AM 11/26/2022    9:35 AM 02/18/2022    8:46 AM  GAD 7 : Generalized Anxiety Score  Nervous, Anxious, on Edge 0 0 0 0  Control/stop worrying 0 0 0 0  Worry too much - different things 0 0 0 0  Trouble relaxing 0 0 0 0  Restless 0 0 0 0  Easily annoyed or irritable 0 0 0 0  Afraid - awful might happen 0 0 0 0  Total GAD 7 Score 0 0 0 0  Anxiety Difficulty Not difficult at all Not difficult at all Not difficult at all Not difficult at all   Advanced Directives Does patient have a HCPOA?    no If yes, name and contact information:  Does patient  have a living will or MOST form?  no A voluntary discussion about advance care planning including the explanation and discussion of advance directives was extensively discussed  with the patient for 15 minutes with patient.  Explanation about the health care proxy and Living will was reviewed and packet with forms with explanation of how to fill them out was given.  During this discussion, the patient was able to identify a health care proxy as unknown and plans to fill out the paperwork required.  Patient was offered a separate Advance Care Planning visit for further assistance with forms.     Past Medical History:  Past Medical History:  Diagnosis Date   Cancer [facility])    Prostate   Cataract    bilateral   Hyperlipidemia    Hypertension    controlled on meds   Sinus drainage    Surgical History:  Past Surgical History:  Procedure Laterality Date   COLONOSCOPY WITH PROPOFOL N/A 04/05/2016   Procedure: COLONOSCOPY WITH PROPOFOL;  Surgeon: Midge Minium, MD;  Location: [facility];  Service: Endoscopy;  Laterality: N/A;   PROSTATE BIOPSY  March 2017   showed some cells, but they are slow growing   RADIOACTIVE SEED IMPLANT N/A 02/01/2017   Procedure: RADIOACTIVE SEED IMPLANT/BRACHYTHERAPY IMPLANT;  Surgeon: Vanna Scotland, MD;  Location: [facility] ORS;  Service: Urology;  Laterality: N/A;   TONSILLECTOMY AND ADENOIDECTOMY     WISDOM TOOTH EXTRACTION      Medications:  Current Outpatient Medications on File Prior to Visit  Medication Sig   Ascorbic Acid (VITAMIN C) 1000 MG tablet Take 1,000 mg by mouth daily.   aspirin 81 MG tablet Take 81 mg by mouth daily. Am, Reported on 11/11/2015   atorvastatin (LIPITOR) 10 MG tablet Take 1 tablet (10 mg total) by mouth daily.   Cholecalciferol (VITAMIN D3 PO) Take 1,000 Units by mouth daily.   lisinopril-hydrochlorothiazide (ZESTORETIC) 20-25 MG tablet Take 1 tablet by mouth daily.   Multiple Vitamin (MULTIVITAMIN) tablet Take 1 tablet by mouth  daily.   vitamin B-12 (CYANOCOBALAMIN) 1000 MCG tablet Take 1,000 mcg by mouth daily.   No current facility-administered medications on file prior to visit.    Allergies:  No Known Allergies  Social History:  Social History   Socioeconomic History   Marital status: Legally Separated    Spouse name: Not on file   Number of children: Not on file   Years of education: Not on file   Highest education level: 10th grade  Occupational History  Occupation: retired  Tobacco Use   Smoking status: Former    Current packs/day: 0.00    Average packs/day: 0.5 packs/day for 8.0 years (4.0 ttl pk-yrs)    Types: Cigarettes    Start date: 10/11/1970    Quit date: 10/11/1978    Years since quitting: 44.9   Smokeless tobacco: Never  Vaping Use   Vaping status: Never Used  Substance and Sexual Activity   Alcohol use: Not Currently    Alcohol/week: 0.0 standard drinks of alcohol    Comment: occasional glass of wine   Drug use: No   Sexual activity: Not Currently  Other Topics Concern   Not on file  Social History Narrative   Not on file   Social Determinants of Health   Financial Resource Strain: Low Risk  (08/26/2023)   Overall Financial Resource Strain (CARDIA)    Difficulty of Paying Living Expenses: Not hard at all  Food Insecurity: No Food Insecurity (08/26/2023)   Hunger Vital Sign    Worried About Running Out of Food in the Last Year: Never true    Ran Out of Food in the Last Year: Never true  Transportation Needs: No Transportation Needs (08/26/2023)   PRAPARE - Administrator, Civil Service (Medical): No    Lack of Transportation (Non-Medical): No  Physical Activity: Insufficiently Active (08/26/2023)   Exercise Vital Sign    Days of Exercise per Week: 3 days    Minutes of Exercise per Session: 20 min  Stress: No Stress Concern Present (08/26/2023)   [employer] of Occupational Health - Occupational Stress Questionnaire    Feeling of Stress : Not at all   Social Connections: Moderately Isolated (08/26/2023)   Social Connection and Isolation Panel [NHANES]    Frequency of Communication with Friends and Family: More than three times a week    Frequency of Social Gatherings with Friends and Family: More than three times a week    Attends Religious Services: More than 4 times per year    Active Member of [employer] or Organizations: No    Attends Banker Meetings: Never    Marital Status: Widowed  Intimate Partner Violence: Not At Risk (08/26/2023)   Humiliation, Afraid, Rape, and Kick questionnaire    Fear of Current or Ex-Partner: No    Emotionally Abused: No    Physically Abused: No    Sexually Abused: No   Social History   Tobacco Use  Smoking Status Former   Current packs/day: 0.00   Average packs/day: 0.5 packs/day for 8.0 years (4.0 ttl pk-yrs)   Types: Cigarettes   Start date: 10/11/1970   Quit date: 10/11/1978   Years since quitting: 44.9  Smokeless Tobacco Never   Social History   Substance and Sexual Activity  Alcohol Use Not Currently   Alcohol/week: 0.0 standard drinks of alcohol   Comment: occasional glass of wine    Family History:  Family History  Problem Relation Age of Onset   Hypertension Mother    Kidney disease Mother    Cancer Sister        thyroid   Kidney disease Brother    Heart disease Brother    Hypertension Brother    Lupus Daughter    Kidney disease Daughter    COPD Neg Hx    Diabetes Neg Hx    Stroke Neg Hx     Past medical history, surgical history, medications, allergies, family history and social history reviewed with patient today and changes  made to appropriate areas of the chart.   ROS All other ROS negative except what is listed above and in the HPI.      Objective:    BP 123/72   Pulse 92   Temp 98.3 F (36.8 C) (Oral)   Ht 5\' 10"  (1.778 m)   Wt 187 lb 12.8 oz (85.2 kg)   SpO2 96%   BMI 26.95 kg/m   Wt Readings from Last 3 Encounters:  08/26/23 187 lb 12.8 oz  (85.2 kg)  02/21/23 185 lb 6.4 oz (84.1 kg)  12/13/22 187 lb 6.3 oz (85 kg)   Physical Exam Vitals and nursing note reviewed.  Constitutional:      General: He is awake. He is not in acute distress.    Appearance: He is well-developed and well-groomed. He is not ill-appearing or toxic-appearing.  HENT:     Head: Normocephalic and atraumatic.     Right Ear: Hearing, tympanic membrane, ear canal and external ear normal. No drainage.     Left Ear: Hearing, tympanic membrane, ear canal and external ear normal. No drainage.     Nose: Nose normal.     Mouth/Throat:     Pharynx: Uvula midline.  Eyes:     General: Lids are normal.        Right eye: No discharge.        Left eye: No discharge.     Extraocular Movements: Extraocular movements intact.     Conjunctiva/sclera: Conjunctivae normal.     Pupils: Pupils are equal, round, and reactive to light.     Visual Fields: Right eye visual fields normal and left eye visual fields normal.  Neck:     Thyroid: No thyromegaly.     Vascular: No carotid bruit or JVD.     Trachea: Trachea normal.  Cardiovascular:     Rate and Rhythm: Normal rate and regular rhythm.     Heart sounds: Normal heart sounds, S1 normal and S2 normal. No murmur heard.    No gallop.  Pulmonary:     Effort: Pulmonary effort is normal. No accessory muscle usage or respiratory distress.     Breath sounds: Normal breath sounds.  Abdominal:     General: Bowel sounds are normal.     Palpations: Abdomen is soft. There is no hepatomegaly or splenomegaly.     Tenderness: There is no abdominal tenderness.  Musculoskeletal:        General: Normal range of motion.     Cervical back: Normal range of motion and neck supple.     Right lower leg: No edema.     Left lower leg: No edema.  Lymphadenopathy:     Head:     Right side of head: No submental, submandibular, tonsillar, preauricular or posterior auricular adenopathy.     Left side of head: No submental, submandibular,  tonsillar, preauricular or posterior auricular adenopathy.     Cervical: No cervical adenopathy.  Skin:    General: Skin is warm and dry.     Capillary Refill: Capillary refill takes less than 2 seconds.     Findings: No rash.  Neurological:     Mental Status: He is alert and oriented to person, place, and time.     Gait: Gait is intact.     Deep Tendon Reflexes: Reflexes are normal and symmetric.     Reflex Scores:      Brachioradialis reflexes are 2+ on the right side and 2+ on the left side.  Patellar reflexes are 2+ on the right side and 2+ on the left side. Psychiatric:        Attention and Perception: Attention normal.        Mood and Affect: Mood normal.        Speech: Speech normal.        Behavior: Behavior normal. Behavior is cooperative.        Thought Content: Thought content normal.        Cognition and Memory: Cognition normal.       08/26/2023    8:42 AM 06/02/2022   10:37 AM 06/01/2021   10:36 AM 05/26/2020   10:38 AM 04/26/2019    1:08 PM  6CIT Screen  What Year? 0 points 0 points 0 points 0 points 0 points  What month? 0 points 0 points 0 points 0 points 0 points  What time? 0 points 0 points 0 points 0 points 0 points  Count back from 20 0 points 0 points 0 points 0 points 0 points  Months in reverse 0 points 0 points 0 points 0 points 0 points  Repeat phrase 0 points 0 points 2 points 2 points 0 points  Total Score 0 points 0 points 2 points 2 points 0 points   Results for orders placed or performed in visit on 02/21/23  CBC with Differential/Platelet  Result Value Ref Range   WBC 6.1 3.4 - 10.8 x10E3/uL   RBC 4.58 4.14 - 5.80 x10E6/uL   Hemoglobin 14.1 13.0 - 17.7 g/dL   Hematocrit 16.1 09.6 - 51.0 %   MCV 95 79 - 97 fL   MCH 30.8 26.6 - 33.0 pg   MCHC 32.4 31.5 - 35.7 g/dL   RDW 04.5 40.9 - 81.1 %   Platelets 272 150 - 450 x10E3/uL   Neutrophils 54 Not Estab. %   Lymphs 34 Not Estab. %   Monocytes 10 Not Estab. %   Eos 1 Not Estab. %   Basos  1 Not Estab. %   Neutrophils Absolute 3.4 1.4 - 7.0 x10E3/uL   Lymphocytes Absolute 2.0 0.7 - 3.1 x10E3/uL   Monocytes Absolute 0.6 0.1 - 0.9 x10E3/uL   EOS (ABSOLUTE) 0.0 0.0 - 0.4 x10E3/uL   Basophils Absolute 0.0 0.0 - 0.2 x10E3/uL   Immature Granulocytes 0 Not Estab. %   Immature Grans (Abs) 0.0 0.0 - 0.1 x10E3/uL  Comprehensive metabolic panel  Result Value Ref Range   Glucose 97 70 - 99 mg/dL   BUN 14 8 - 27 mg/dL   Creatinine, Ser 9.14 0.76 - 1.27 mg/dL   eGFR 94 >78 GN/FAO/1.30   BUN/Creatinine Ratio 18 10 - 24   Sodium 137 134 - 144 mmol/L   Potassium 4.7 3.5 - 5.2 mmol/L   Chloride 98 96 - 106 mmol/L   CO2 26 20 - 29 mmol/L   Calcium 9.5 8.6 - 10.2 mg/dL   Total Protein 7.3 6.0 - 8.5 g/dL   Albumin 4.1 3.8 - 4.8 g/dL   Globulin, Total 3.2 1.5 - 4.5 g/dL   Albumin/Globulin Ratio 1.3 1.2 - 2.2   Bilirubin Total 0.9 0.0 - 1.2 mg/dL   Alkaline Phosphatase 83 44 - 121 IU/L   AST 19 0 - 40 IU/L   ALT 19 0 - 44 IU/L  Lipid Panel w/o Chol/HDL Ratio  Result Value Ref Range   Cholesterol, Total 124 100 - 199 mg/dL   Triglycerides 60 0 - 149 mg/dL   HDL 47 >86 mg/dL  VLDL Cholesterol Cal 13 5 - 40 mg/dL   LDL Chol Calc (NIH) 64 0 - 99 mg/dL  PSA  Result Value Ref Range   Prostate Specific Ag, Serum <0.1 0.0 - 4.0 ng/mL  TSH  Result Value Ref Range   TSH 1.320 0.450 - 4.500 uIU/mL  Microalbumin, Urine Waived  Result Value Ref Range   Microalb, Ur Waived 10 0 - 19 mg/L   Creatinine, Urine Waived 100 10 - 300 mg/dL   Microalb/Creat Ratio <30 <30 mg/g  Bayer DCA Hb A1c Waived  Result Value Ref Range   HB A1C (BAYER DCA - WAIVED) 5.9 (H) 4.8 - 5.6 %      Assessment & Plan:   Problem List Items Addressed This Visit       Cardiovascular and Mediastinum   Hypertension    Chronic, stable.  BP below goal for age in office today.  Recommend he monitor BP at least a few mornings a week at home and document.  DASH diet at home.  Continue current medication regimen and  adjust as needed.  Labs today: CMP.  Refills sent.  Return in 6 months.          Relevant Orders   Comprehensive metabolic panel     Other   Advanced care planning/counseling discussion    A voluntary discussion about advance care planning including the explanation and discussion of advance directives was extensively discussed  with the patient for 15 minutes with patient present.  Explanation about the health care proxy and Living will was reviewed and packet with forms with explanation of how to fill them out was given.  During this discussion, the patient was able to identify a health care proxy as unknown and plans to fill out the paperwork required.  Patient was offered a separate Advance Care Planning visit for further assistance with forms.         Elevated hemoglobin A1c measurement    Ongoing and stable.  Noted on past labs, focusing on diet changes and regular activity.  From 5.7% to 6.1% range -- continue current diet focus.  Recheck lab today.      Relevant Orders   HgB A1c   History of prostate cancer    Stable -- followed by oncology in past and to return as needed.  PSA up to date.      Hyperlipemia    Chronic, ongoing.  Continue current medication regimen and adjust dose as needed.  Lipid panel today.        Relevant Orders   Comprehensive metabolic panel   Lipid Panel w/o Chol/HDL Ratio   Overweight    BMI 26.95.  Recommended eating smaller high protein, low fat meals more frequently and exercising 30 mins a day 5 times a week with a goal of 10-15lb weight loss in the next 3 months. Patient voiced their understanding and motivation to adhere to these recommendations.       Other Visit Diagnoses     Medicare annual wellness visit, subsequent    -  Primary   Due for Medicare Wellness performed with patient today.       Discussed aspirin prophylaxis for myocardial infarction prevention and decision was made to continue ASA  LABORATORY TESTING:  Health  maintenance labs ordered today as discussed above.   The natural history of prostate cancer and ongoing controversy regarding screening and potential treatment outcomes of prostate cancer has been discussed with the patient. The meaning of a false positive  PSA and a false negative PSA has been discussed. He indicates understanding of the limitations of this screening test and wishes to proceed with screening PSA testing -- this is up to date and stable <0.1.   IMMUNIZATIONS:   - Tdap: Tetanus vaccination status reviewed: last tetanus booster within 10 years. - Influenza: Up to date - Pneumovax: Up to date - Prevnar: Up to date - Zostavax vaccine: Refused  SCREENING: - Colonoscopy: Refused  Discussed with patient purpose of the colonoscopy is to detect colon cancer at curable precancerous or early stages   - AAA Screening: Up to date  -Hearing Test: Not applicable  -Spirometry: Not applicable   PATIENT COUNSELING:    Sexuality: Discussed sexually transmitted diseases, partner selection, use of condoms, avoidance of unintended pregnancy  and contraceptive alternatives.   Advised to avoid cigarette smoking.  I discussed with the patient that most people either abstain from alcohol or drink within safe limits (<=14/week and <=4 drinks/occasion for males, <=7/weeks and <= 3 drinks/occasion for females) and that the risk for alcohol disorders and other health effects rises proportionally with the number of drinks per week and how often a drinker exceeds daily limits.  Discussed cessation/primary prevention of drug use and availability of treatment for abuse.   Diet: Encouraged to adjust caloric intake to maintain  or achieve ideal body weight, to reduce intake of dietary saturated fat and total fat, to limit sodium intake by avoiding high sodium foods and not adding table salt, and to maintain adequate dietary potassium and calcium preferably from fresh fruits, vegetables, and low-fat dairy  products.    Stressed the importance of regular exercise  Injury prevention: Discussed safety belts, safety helmets, smoke detector, smoking near bedding or upholstery.   Dental health: Discussed importance of regular tooth brushing, flossing, and dental visits.   Follow up plan: NEXT PREVENTATIVE PHYSICAL DUE IN 1 YEAR. Return in about 6 months (around 02/23/2024) for HTN/HLD, H/O PROSTATE CANCER.

## 2023-08-26 NOTE — Assessment & Plan Note (Signed)
Ongoing and stable.  Noted on past labs, focusing on diet changes and regular activity.  From 5.7% to 6.1% range -- continue current diet focus.  Recheck lab today.

## 2023-08-26 NOTE — Assessment & Plan Note (Signed)
Chronic, ongoing.  Continue current medication regimen and adjust dose as needed.  Lipid panel today.   

## 2023-08-26 NOTE — Assessment & Plan Note (Signed)
Chronic, stable.  BP below goal for age in office today.  Recommend he monitor BP at least a few mornings a week at home and document.  DASH diet at home.  Continue current medication regimen and adjust as needed.  Labs today: CMP.  Refills sent.  Return in 6 months.

## 2023-08-27 LAB — COMPREHENSIVE METABOLIC PANEL
ALT: 22 [IU]/L (ref 0–44)
AST: 25 [IU]/L (ref 0–40)
Albumin: 4.1 g/dL (ref 3.8–4.8)
Alkaline Phosphatase: 83 [IU]/L (ref 44–121)
BUN/Creatinine Ratio: 15 (ref 10–24)
BUN: 12 mg/dL (ref 8–27)
Bilirubin Total: 1 mg/dL (ref 0.0–1.2)
CO2: 23 mmol/L (ref 20–29)
Calcium: 9.3 mg/dL (ref 8.6–10.2)
Chloride: 102 mmol/L (ref 96–106)
Creatinine, Ser: 0.82 mg/dL (ref 0.76–1.27)
Globulin, Total: 3.1 g/dL (ref 1.5–4.5)
Glucose: 102 mg/dL — ABNORMAL HIGH (ref 70–99)
Potassium: 4.3 mmol/L (ref 3.5–5.2)
Sodium: 139 mmol/L (ref 134–144)
Total Protein: 7.2 g/dL (ref 6.0–8.5)
eGFR: 93 mL/min/{1.73_m2} (ref >59)

## 2023-08-27 LAB — LIPID PANEL W/O CHOL/HDL RATIO
Cholesterol, Total: 121 mg/dL (ref 100–199)
HDL: 43 mg/dL (ref >39)
LDL Chol Calc (NIH): 65 mg/dL (ref 0–99)
Triglycerides: 58 mg/dL (ref 0–149)
VLDL Cholesterol Cal: 13 mg/dL (ref 5–40)

## 2023-08-27 LAB — HEMOGLOBIN A1C
Est. average glucose Bld gHb Est-mCnc: 134 mg/dL
Hgb A1c MFr Bld: 6.3 % — ABNORMAL HIGH (ref 4.8–5.6)

## 2023-08-28 NOTE — Progress Notes (Signed)
Contacted via MyChart   Good morning Jared Herrera, your labs have returned: - Kidney function, creatinine and eGFR, remains normal, as is liver function, AST and ALT.  - A1c remains in prediabetic range and has crept up a little from 5.9% to now 6.3%.  Please ensure to focus heavily on regular exercise and health diet choices. - Lipid panel continues to who levels at goal.  Continue all current medications.  Any questions? Keep being amazing!!  Thank you for allowing me to participate in your care.  I appreciate you. Kindest regards, Hayven Fatima

## 2024-02-18 NOTE — Patient Instructions (Signed)
 Be Involved in Caring For Your Health:  Taking Medications When medications are taken as directed, they can greatly improve your health. But if they are not taken as prescribed, they may not work. In some cases, not taking them correctly can be harmful. To help ensure your treatment remains effective and safe, understand your medications and how to take them. Bring your medications to each visit for review by your provider.  Your lab results, notes, and after visit summary will be available on My Chart. We strongly encourage you to use this feature. If lab results are abnormal the clinic will contact you with the appropriate steps. If the clinic does not contact you assume the results are satisfactory. You can always view your results on My Chart. If you have questions regarding your health or results, please contact the clinic during office hours. You can also ask questions on My Chart.  We at [facility] are grateful that you chose [place] to provide your care. We strive to provide evidence-based and compassionate care and are always looking for feedback. If you get a survey from the clinic please complete this so we can hear your opinions.  Prediabetes: What to Know Prediabetes is when your blood sugar, also called glucose, is at a higher level than normal but not high enough for you to be diagnosed with type 2 diabetes (type 2 diabetes mellitus). Having prediabetes puts you at risk for getting type 2 diabetes. By making some healthy changes, you may be able to prevent or delay getting type 2 diabetes. This is important because type 2 diabetes can lead to serious problems. Some of these include: Heart disease. Stroke. Blindness. Kidney disease. Depression. Poor blood flow in the feet and legs. In very bad cases, this could lead to having a leg removed by surgery (amputation). What are the causes? The exact cause of prediabetes isn't known. It may result from insulin resistance. Insulin  resistance happens when cells in the body don't respond properly to insulin that the body makes. This can cause too much sugar to build up in the blood. High blood sugar, also called hyperglycemia, can develop. What increases the risk? Having a family member with type 2 diabetes. Being older than 74 years of age. Having had a temporary form of diabetes during a pregnancy. This is called gestational diabetes. Having had polycystic ovary syndrome (PCOS). Being overweight or obese. Being inactive and not getting much exercise. Having a history of heart disease. This may include problems with cholesterol levels, high levels of blood fats, or high blood pressure. What are the signs or symptoms? You may have no symptoms. If you do have symptoms, they may include: Increased hunger. Increased thirst. Needing to pee more often. Changes in how you see, like blurry vision. Feeling tired. How is this diagnosed? Prediabetes can be diagnosed with blood tests that check your blood sugar. One or more of these tests may be done: A fasting blood glucose (FBG) test. You won't be allowed to eat (you will fast) for at least 8 hours before a blood sample is taken. An A1C blood test, also called a hemoglobin A1C test. This test shows information about blood sugar levels over the past 2?3 months. An oral glucose tolerance test (OGTT). This test measures your blood sugar at two points in time: After you haven't eaten for a while. This is your baseline level. Two hours after you drink a beverage that has sugar in it. You may be diagnosed with prediabetes  if: Your FBG is 100?125 mg/dL (1.6-1.0 mmol/L). Your A1C level is 5.7?6.4% (39-46 mmol/mol). Your OGTT result is 140?199 mg/dL (9.6-04 mmol/L). These blood tests may need to be done again to be sure of the diagnosis. How is this treated? Treatment may include making changes to your diet and lifestyle. These changes can help lower your blood sugar and keep you  from getting type 2 diabetes. In some cases, medicine may be given to help lower your risk. Follow these instructions at home: Eating and drinking  Eat and drink as told. Follow a healthy meal plan. This includes eating lean proteins, whole grains, legumes, fresh fruits and vegetables, low-fat dairy products, and healthy fats. Meet with an expert in healthy eating called a dietitian. This person can help create a healthy eating plan that's right for you. Lifestyle Do moderate-intensity exercise. Do this for at least 30 minutes a day on 5 or more days each week, or as told by your health care provider. A mix of activities may be best. Good choices include brisk walking, swimming, biking, and weight lifting. Try to lose weight if your provider says it's OK. Losing 5-7% of your body weight can help reverse insulin resistance. Do not drink alcohol if: Your provider tells you not to drink. You're pregnant, may be pregnant, or plan to become pregnant. If you drink alcohol: Limit how much you have to: 0-1 drink a day if you're male. 0-2 drinks a day if you're male. Know how much alcohol is in your drink. In the [place] one drink is one 12 oz bottle of beer (355 mL), one 5 oz glass of wine (148 mL), or one 1 oz glass of hard liquor (44 mL). General instructions Take medicines only as told. You may be given medicines that help lower the risk of type 2 diabetes. Do not smoke, vape, or use nicotine or tobacco. Where to find more information American Diabetes Association: diabetes.org/about-diabetes/prediabetes Academy of Nutrition and Dietetics: eatright.org American Heart Association: Go to ThisJobs.cz. Click the search icon. Type "prediabetes" in the search box. Contact a health care provider if: You have any of these symptoms: Increased hunger. Peeing more often than usual. Increased thirst. Feeling tired. Changes in how you see, like blurry vision. Feeling like you may throw  up. Throwing up. Get help right away if: You have shortness of breath. You feel confused. This information is not intended to replace advice given to you by your health care provider. Make sure you discuss any questions you have with your health care provider. Document Revised: 05/01/2023 Document Reviewed: 05/01/2023 Elsevier Patient Education  2024 [employer].

## 2024-02-23 ENCOUNTER — Ambulatory Visit (INDEPENDENT_AMBULATORY_CARE_PROVIDER_SITE_OTHER): Payer: Self-pay | Admitting: Nurse Practitioner

## 2024-02-23 ENCOUNTER — Encounter: Payer: Self-pay | Admitting: Nurse Practitioner

## 2024-02-23 VITALS — BP 134/82 | HR 76 | Temp 97.6°F | Ht 70.0 in | Wt 182.8 lb

## 2024-02-23 DIAGNOSIS — E663 Overweight: Secondary | ICD-10-CM

## 2024-02-23 DIAGNOSIS — R338 Other retention of urine: Secondary | ICD-10-CM | POA: Diagnosis not present

## 2024-02-23 DIAGNOSIS — E782 Mixed hyperlipidemia: Secondary | ICD-10-CM

## 2024-02-23 DIAGNOSIS — Z8546 Personal history of malignant neoplasm of prostate: Secondary | ICD-10-CM

## 2024-02-23 DIAGNOSIS — R7309 Other abnormal glucose: Secondary | ICD-10-CM

## 2024-02-23 DIAGNOSIS — N401 Enlarged prostate with lower urinary tract symptoms: Secondary | ICD-10-CM | POA: Diagnosis not present

## 2024-02-23 DIAGNOSIS — I10 Essential (primary) hypertension: Secondary | ICD-10-CM

## 2024-02-23 LAB — MICROALBUMIN, URINE WAIVED
Creatinine, Urine Waived: 200 mg/dL (ref 10–300)
Microalb, Ur Waived: 30 mg/L — ABNORMAL HIGH (ref 0–19)
Microalb/Creat Ratio: <30 mg/g (ref <30)

## 2024-02-23 LAB — BAYER DCA HB A1C WAIVED: HB A1C (BAYER DCA - WAIVED): 5.8 % — ABNORMAL HIGH (ref 4.8–5.6)

## 2024-02-23 MED ORDER — ATORVASTATIN CALCIUM 10 MG PO TABS: 10.0000 mg | ORAL_TABLET | Freq: Every day | ORAL | 4 refills | Status: AC

## 2024-02-23 MED ORDER — LISINOPRIL-HYDROCHLOROTHIAZIDE 20-25 MG PO TABS: 1.0000 | ORAL_TABLET | Freq: Every day | ORAL | 4 refills | Status: AC

## 2024-02-23 NOTE — Assessment & Plan Note (Signed)
 Chronic, stable.  BP below goal for age in office today.  Recommend he monitor BP at least a few mornings a week at home and document.  DASH diet at home.  Continue current medication regimen and adjust as needed.  Labs today: CBC, TSH, CMP.  Urine ALB 09 Mar 2024. Refills sent.  Return in 6 months.

## 2024-02-23 NOTE — Assessment & Plan Note (Signed)
 BMI 26.23.  Recommended eating smaller high protein, low fat meals more frequently and exercising 30 mins a day 5 times a week with a goal of 10-15lb weight loss in the next 3 months. Patient voiced their understanding and motivation to adhere to these recommendations.

## 2024-02-23 NOTE — Progress Notes (Signed)
 BP 134/82   Pulse 76   Temp 97.6 F (36.4 C) (Oral)   Ht 5\' 10"  (1.778 m)   Wt 182 lb 12.8 oz (82.9 kg)   SpO2 95%   BMI 26.23 kg/m    Subjective:    Patient ID: Jared Herrera, male    DOB: [birth year], 73 y.o.   MRN: [id number]  HPI: Jared Herrera is a 73 y.o. male  Chief Complaint  Patient presents with   Hyperlipidemia   Hypertension   Prostate Cancer   HYPERTENSION / HYPERLIPIDEMIA + PROSTATE CANCER HISTORY Continues to take Zestoretic  and Atorvastatin  daily.  History of radiation treatment (seeds placed in 2018) for prostate cancer >6 years ago and had follow-up with Dr. Jacalyn Martin last on 07/20/21.  Past smoker quit in 1985. Satisfied with current treatment? yes Duration of hypertension: chronic BP monitoring frequency: not checking BP range:  BP medication side effects: no Duration of hyperlipidemia: chronic Cholesterol medication side effects: no Cholesterol supplements: none Medication compliance: good compliance Aspirin: yes Recent stressors: no Recurrent headaches: no Visual changes: no Palpitations: no Dyspnea: no Chest pain: no Lower extremity edema: no Dizzy/lightheaded: no   Impaired Fasting Glucose HbA1C:  Lab Results  Component Value Date   HGBA1C 6.3 (H) 08/26/2023  Duration of elevated blood sugar: years Polydipsia: no Polyuria: no Weight change: no Visual disturbance: no Glucose Monitoring: no    Accucheck frequency: Not Checking    Fasting glucose:     Post prandial:  Diabetic Education: Not Completed Family history of diabetes: yes - paternal uncle      02/23/2024    8:30 AM 08/26/2023    8:38 AM 02/21/2023    8:15 AM 11/26/2022    9:34 AM 06/02/2022   10:40 AM  Depression screen PHQ 2/9  Decreased Interest 0 0 0 0 0  Down, Depressed, Hopeless 0 0 0 0 0  PHQ - 2 Score 0 0 0 0 0  Altered sleeping 0 2 0 2   Tired, decreased energy 0 0 0 0   Change in appetite 0 0 0 2   Feeling bad or failure about yourself  0 0 0  0   Trouble concentrating 0 0 0 0   Moving slowly or fidgety/restless 0 0 0 0   Suicidal thoughts 0 0 0 0   PHQ-9 Score 0 2 0 4   Difficult doing work/chores Not difficult at all Not difficult at all Not difficult at all Not difficult at all        02/23/2024    8:30 AM 08/26/2023    8:38 AM 02/21/2023    8:15 AM 11/26/2022    9:35 AM  GAD 7 : Generalized Anxiety Score  Nervous, Anxious, on Edge 0 0 0 0  Control/stop worrying 0 0 0 0  Worry too much - different things 0 0 0 0  Trouble relaxing 0 0 0 0  Restless 0 0 0 0  Easily annoyed or irritable 0 0 0 0  Afraid - awful might happen 0 0 0 0  Total GAD 7 Score 0 0 0 0  Anxiety Difficulty Not difficult at all Not difficult at all Not difficult at all Not difficult at all   Relevant past medical, surgical, family and social history reviewed and updated as indicated. Interim medical history since our last visit reviewed. Allergies and medications reviewed and updated.  Review of Systems  Constitutional:  Negative for activity change, diaphoresis, fatigue and fever.  Respiratory:  Negative for cough, chest tightness, shortness of breath and wheezing.   Cardiovascular:  Negative for chest pain, palpitations and leg swelling.  Gastrointestinal: Negative.   Endocrine: Negative for cold intolerance, heat intolerance, polydipsia, polyphagia and polyuria.  Neurological: Negative.   Psychiatric/Behavioral: Negative.     Per HPI unless specifically indicated above     Objective:    BP 134/82   Pulse 76   Temp 97.6 F (36.4 C) (Oral)   Ht 5\' 10"  (1.778 m)   Wt 182 lb 12.8 oz (82.9 kg)   SpO2 95%   BMI 26.23 kg/m   Wt Readings from Last 3 Encounters:  02/23/24 182 lb 12.8 oz (82.9 kg)  08/26/23 187 lb 12.8 oz (85.2 kg)  02/21/23 185 lb 6.4 oz (84.1 kg)    Physical Exam Vitals and nursing note reviewed.  Constitutional:      General: He is awake. He is not in acute distress.    Appearance: He is well-developed and overweight.   HENT:     Head: Normocephalic and atraumatic.     Right Ear: Hearing normal. No drainage.     Left Ear: Hearing normal. No drainage.  Eyes:     General: Lids are normal.        Right eye: No discharge.        Left eye: No discharge.     Conjunctiva/sclera: Conjunctivae normal.     Pupils: Pupils are equal, round, and reactive to light.  Neck:     Vascular: No carotid bruit.  Cardiovascular:     Rate and Rhythm: Normal rate and regular rhythm.     Heart sounds: Normal heart sounds, S1 normal and S2 normal. No murmur heard.    No gallop.  Pulmonary:     Effort: Pulmonary effort is normal. No accessory muscle usage or respiratory distress.     Breath sounds: Normal breath sounds.  Abdominal:     General: Bowel sounds are normal.     Palpations: Abdomen is soft.  Musculoskeletal:        General: Normal range of motion.     Cervical back: Normal range of motion and neck supple.     Right lower leg: No edema.     Left lower leg: No edema.  Skin:    General: Skin is warm and dry.  Neurological:     Mental Status: He is alert and oriented to person, place, and time.  Psychiatric:        Mood and Affect: Mood normal.        Behavior: Behavior normal. Behavior is cooperative.        Thought Content: Thought content normal.        Judgment: Judgment normal.    Results for orders placed or performed in visit on 08/26/23  Comprehensive metabolic panel   Collection Time: 08/26/23  9:04 AM  Result Value Ref Range   Glucose 102 (H) 70 - 99 mg/dL   BUN 12 8 - 27 mg/dL   Creatinine, Ser 4.09 0.76 - 1.27 mg/dL   eGFR 93 >81 XB/JYN/8.29   BUN/Creatinine Ratio 15 10 - 24   Sodium 139 134 - 144 mmol/L   Potassium 4.3 3.5 - 5.2 mmol/L   Chloride 102 96 - 106 mmol/L   CO2 23 20 - 29 mmol/L   Calcium  9.3 8.6 - 10.2 mg/dL   Total Protein 7.2 6.0 - 8.5 g/dL   Albumin 4.1 3.8 - 4.8 g/dL   Globulin, Total 3.1  1.5 - 4.5 g/dL   Bilirubin Total 1.0 0.0 - 1.2 mg/dL   Alkaline Phosphatase 83  44 - 121 IU/L   AST 25 0 - 40 IU/L   ALT 22 0 - 44 IU/L  Lipid Panel w/o Chol/HDL Ratio   Collection Time: 08/26/23  9:04 AM  Result Value Ref Range   Cholesterol, Total 121 100 - 199 mg/dL   Triglycerides 58 0 - 149 mg/dL   HDL 43 >16 mg/dL   VLDL Cholesterol Cal 13 5 - 40 mg/dL   LDL Chol Calc (NIH) 65 0 - 99 mg/dL  HgB X0R   Collection Time: 08/26/23  9:04 AM  Result Value Ref Range   Hgb A1c MFr Bld 6.3 (H) 4.8 - 5.6 %   Est. average glucose Bld gHb Est-mCnc 134 mg/dL      Assessment & Plan:   Problem List Items Addressed This Visit       Cardiovascular and Mediastinum   Hypertension - Primary   Chronic, stable.  BP below goal for age in office today.  Recommend he monitor BP at least a few mornings a week at home and document.  DASH diet at home.  Continue current medication regimen and adjust as needed.  Labs today: CBC, TSH, CMP.  Urine ALB 09 Mar 2024. Refills sent.  Return in 6 months.          Relevant Medications   atorvastatin  (LIPITOR) 10 MG tablet   lisinopril -hydrochlorothiazide  (ZESTORETIC ) 20-25 MG tablet   Other Relevant Orders   Microalbumin, Urine Waived   CBC with Differential/Platelet   Comprehensive metabolic panel with GFR   TSH     Other   Overweight   BMI 26.23.  Recommended eating smaller high protein, low fat meals more frequently and exercising 30 mins a day 5 times a week with a goal of 10-15lb weight loss in the next 3 months. Patient voiced their understanding and motivation to adhere to these recommendations.       Hyperlipemia   Chronic, ongoing.  Continue current medication regimen and adjust dose as needed.  Lipid panel today.        Relevant Medications   atorvastatin  (LIPITOR) 10 MG tablet   lisinopril -hydrochlorothiazide  (ZESTORETIC ) 20-25 MG tablet   Other Relevant Orders   Comprehensive metabolic panel with GFR   Lipid Panel w/o Chol/HDL Ratio   History of prostate cancer   Stable -- followed by oncology in past and to  return as needed.  PSA today.      Relevant Orders   PSA   Elevated hemoglobin A1c measurement   Ongoing and stable.  A1c trend down today at 5.8%.  Noted on past labs, focusing on diet changes and regular activity.  From 5.7% to 6.1% range -- continue current diet focus.  Urine ALB 09 Mar 2024.      Relevant Orders   Bayer DCA Hb A1c Waived   Microalbumin, Urine Waived     Follow up plan: Return in about 6 months (around 08/25/2024) for HTN/HLD, PREDIABETES.

## 2024-02-23 NOTE — Assessment & Plan Note (Signed)
 Stable -- followed by oncology in past and to return as needed.  PSA today.

## 2024-02-23 NOTE — Assessment & Plan Note (Signed)
Chronic, ongoing.  Continue current medication regimen and adjust dose as needed.  Lipid panel today.   

## 2024-02-23 NOTE — Assessment & Plan Note (Signed)
 Ongoing and stable.  A1c trend down today at 5.8%.  Noted on past labs, focusing on diet changes and regular activity.  From 5.7% to 6.1% range -- continue current diet focus.  Urine ALB 09 Mar 2024.

## 2024-02-24 ENCOUNTER — Ambulatory Visit: Payer: Self-pay | Admitting: Nurse Practitioner

## 2024-02-24 LAB — CBC WITH DIFFERENTIAL/PLATELET
Basophils Absolute: 0 10*3/uL (ref 0.0–0.2)
Basos: 0 %
EOS (ABSOLUTE): 0 10*3/uL (ref 0.0–0.4)
Eos: 0 %
Hematocrit: 45.3 % (ref 37.5–51.0)
Hemoglobin: 14.6 g/dL (ref 13.0–17.7)
Immature Grans (Abs): 0 10*3/uL (ref 0.0–0.1)
Immature Granulocytes: 0 %
Lymphocytes Absolute: 1.9 10*3/uL (ref 0.7–3.1)
Lymphs: 39 %
MCH: 31.4 pg (ref 26.6–33.0)
MCHC: 32.2 g/dL (ref 31.5–35.7)
MCV: 97 fL (ref 79–97)
Monocytes Absolute: 0.6 10*3/uL (ref 0.1–0.9)
Monocytes: 11 %
Neutrophils Absolute: 2.4 10*3/uL (ref 1.4–7.0)
Neutrophils: 50 %
Platelets: 261 10*3/uL (ref 150–450)
RBC: 4.65 x10E6/uL (ref 4.14–5.80)
RDW: 12 % (ref 11.6–15.4)
WBC: 4.9 10*3/uL (ref 3.4–10.8)

## 2024-02-24 LAB — COMPREHENSIVE METABOLIC PANEL WITH GFR
ALT: 20 IU/L (ref 0–44)
AST: 20 IU/L (ref 0–40)
Albumin: 4 g/dL (ref 3.8–4.8)
Alkaline Phosphatase: 83 IU/L (ref 44–121)
BUN/Creatinine Ratio: 19 (ref 10–24)
BUN: 14 mg/dL (ref 8–27)
Bilirubin Total: 0.7 mg/dL (ref 0.0–1.2)
CO2: 25 mmol/L (ref 20–29)
Calcium: 9.4 mg/dL (ref 8.6–10.2)
Chloride: 98 mmol/L (ref 96–106)
Creatinine, Ser: 0.74 mg/dL — ABNORMAL LOW (ref 0.76–1.27)
Globulin, Total: 3 g/dL (ref 1.5–4.5)
Glucose: 97 mg/dL (ref 70–99)
Potassium: 4 mmol/L (ref 3.5–5.2)
Sodium: 139 mmol/L (ref 134–144)
Total Protein: 7 g/dL (ref 6.0–8.5)
eGFR: 95 mL/min/{1.73_m2} (ref >59)

## 2024-02-24 LAB — PSA: Prostate Specific Ag, Serum: 0.1 ng/mL (ref 0.0–4.0)

## 2024-02-24 LAB — LIPID PANEL W/O CHOL/HDL RATIO
Cholesterol, Total: 112 mg/dL (ref 100–199)
HDL: 44 mg/dL (ref >39)
LDL Chol Calc (NIH): 57 mg/dL (ref 0–99)
Triglycerides: 42 mg/dL (ref 0–149)
VLDL Cholesterol Cal: 11 mg/dL (ref 5–40)

## 2024-02-24 LAB — TSH: TSH: 0.796 u[IU]/mL (ref 0.450–4.500)

## 2024-06-20 ENCOUNTER — Other Ambulatory Visit: Payer: Self-pay

## 2024-06-20 ENCOUNTER — Emergency Department
Admission: EM | Admit: 2024-06-20 | Discharge: 2024-06-20 | Disposition: A | Discharge status: Home / Self Care | Attending: Emergency Medicine | Admitting: Emergency Medicine

## 2024-06-20 ENCOUNTER — Emergency Department

## 2024-06-20 DIAGNOSIS — K5730 Diverticulosis of large intestine without perforation or abscess without bleeding: Secondary | ICD-10-CM | POA: Diagnosis not present

## 2024-06-20 DIAGNOSIS — R109 Unspecified abdominal pain: Secondary | ICD-10-CM | POA: Diagnosis not present

## 2024-06-20 DIAGNOSIS — N390 Urinary tract infection, site not specified: Secondary | ICD-10-CM | POA: Insufficient documentation

## 2024-06-20 DIAGNOSIS — R300 Dysuria: Secondary | ICD-10-CM | POA: Diagnosis not present

## 2024-06-20 DIAGNOSIS — N281 Cyst of kidney, acquired: Secondary | ICD-10-CM | POA: Diagnosis not present

## 2024-06-20 DIAGNOSIS — K429 Umbilical hernia without obstruction or gangrene: Secondary | ICD-10-CM | POA: Diagnosis not present

## 2024-06-20 DIAGNOSIS — I10 Essential (primary) hypertension: Secondary | ICD-10-CM | POA: Insufficient documentation

## 2024-06-20 DIAGNOSIS — Z8546 Personal history of malignant neoplasm of prostate: Secondary | ICD-10-CM | POA: Insufficient documentation

## 2024-06-20 LAB — URINALYSIS, ROUTINE W REFLEX MICROSCOPIC
RBC / HPF: >50 RBC/hpf (ref 0–5)
WBC, UA: >50 WBC/hpf (ref 0–5)

## 2024-06-20 LAB — CBC WITH DIFFERENTIAL/PLATELET
Abs Immature Granulocytes: 0.03 K/uL (ref 0.00–0.07)
Basophils Absolute: 0 K/uL (ref 0.0–0.1)
Basophils Relative: 0 %
Eosinophils Absolute: 0 K/uL (ref 0.0–0.5)
Eosinophils Relative: 0 %
HCT: 40.8 % (ref 39.0–52.0)
Hemoglobin: 13.6 g/dL (ref 13.0–17.0)
Immature Granulocytes: 0 %
Lymphocytes Relative: 9 %
Lymphs Abs: 0.9 K/uL (ref 0.7–4.0)
MCH: 31.4 pg (ref 26.0–34.0)
MCHC: 33.3 g/dL (ref 30.0–36.0)
MCV: 94.2 fL (ref 80.0–100.0)
Monocytes Absolute: 0.7 K/uL (ref 0.1–1.0)
Monocytes Relative: 7 %
Neutro Abs: 8.4 K/uL — ABNORMAL HIGH (ref 1.7–7.7)
Neutrophils Relative %: 84 %
Platelets: 244 K/uL (ref 150–400)
RBC: 4.33 MIL/uL (ref 4.22–5.81)
RDW: 12.2 % (ref 11.5–15.5)
WBC: 10 K/uL (ref 4.0–10.5)
nRBC: 0 % (ref 0.0–0.2)

## 2024-06-20 LAB — BASIC METABOLIC PANEL WITH GFR
Anion gap: 12 (ref 5–15)
BUN: 10 mg/dL (ref 8–23)
CO2: 24 mmol/L (ref 22–32)
Calcium: 9 mg/dL (ref 8.9–10.3)
Chloride: 102 mmol/L (ref 98–111)
Creatinine, Ser: 0.69 mg/dL (ref 0.61–1.24)
GFR, Estimated: >60 mL/min (ref >60)
Glucose, Bld: 122 mg/dL — ABNORMAL HIGH (ref 70–99)
Potassium: 3.5 mmol/L (ref 3.5–5.1)
Sodium: 138 mmol/L (ref 135–145)

## 2024-06-20 LAB — CK: Total CK: 200 U/L (ref 49–397)

## 2024-06-20 MED ORDER — CEFDINIR 300 MG PO CAPS: 300.0000 mg | ORAL_CAPSULE | Freq: Two times a day (BID) | ORAL | 0 refills | Status: AC

## 2024-06-20 NOTE — ED Triage Notes (Signed)
 C/O urinary frequency, urgency x 1 day, hematuria today.

## 2024-06-20 NOTE — ED Provider Notes (Signed)
 [facility] Provider Note    Event Date/Time   First MD Initiated Contact with Patient 06/20/24 1203     (approximate)   History   Dysuria   HPI  Jared Herrera is a 73 y.o. male with a past medical history of hypertension, hyperlipidemia, BPH who presents today for evaluation of dysuria and hematuria.  Patient reports that he noticed dark-colored urine this morning and some suprapubic discomfort.  He reports that he has not had any fevers or chills.  He sometimes feels like he is not emptying his bladder completely.  No nausea or vomiting.  No flank pain.  Patient Active Problem List   Diagnosis Date Noted   Elevated hemoglobin A1c measurement 02/19/2021   Advanced care planning/counseling discussion 01/18/2017   Hyperlipemia 08/20/2016   Overweight 08/20/2016   Benign neoplasm of cecum    History of prostate cancer 01/16/2016   Hypertension 09/25/2015   Benign prostatic hyperplasia with urinary retention 09/25/2015          Physical Exam   Triage Vital Signs: ED Triage Vitals [06/20/24 1136]  Encounter Vitals Group     BP (!) 147/84     Girls Systolic BP Percentile      Girls Diastolic BP Percentile      Boys Systolic BP Percentile      Boys Diastolic BP Percentile      Pulse Rate (!) 102     Resp 16     Temp 99 F (37.2 C)     Temp Source Oral     SpO2 98 %     Weight 182 lb 12.2 oz (82.9 kg)     Height      Head Circumference      Peak Flow      Pain Score 0     Pain Loc      Pain Education      Exclude from Growth Chart     Most recent vital signs: Vitals:   06/20/24 1136 06/20/24 1221  BP: (!) 147/84   Pulse: (!) 102   Resp: 16   Temp: 99 F (37.2 C)   SpO2: 98% 98%    Physical Exam Vitals and nursing note reviewed.  Constitutional:      General: Awake and alert. No acute distress.    Appearance: Normal appearance. The patient is normal weight.  HENT:     Head: Normocephalic and atraumatic.     Mouth:  Mucous membranes are moist.  Eyes:     General: PERRL. Normal EOMs        Right eye: No discharge.        Left eye: No discharge.     Conjunctiva/sclera: Conjunctivae normal.  Cardiovascular:     Rate and Rhythm: Normal rate and regular rhythm.     Pulses: Normal pulses.  Pulmonary:     Effort: Pulmonary effort is normal. No respiratory distress.     Breath sounds: Normal breath sounds.  Abdominal:     Abdomen is soft. There is no abdominal tenderness. No rebound or guarding. No distention. No CVAT Musculoskeletal:        General: No swelling. Normal range of motion.     Cervical back: Normal range of motion and neck supple.  Skin:    General: Skin is warm and dry.     Capillary Refill: Capillary refill takes less than 2 seconds.     Findings: No rash.  Neurological:     Mental  Status: The patient is awake and alert.      ED Results / Procedures / Treatments   Labs (all labs ordered are listed, but only abnormal results are displayed) Labs Reviewed  URINALYSIS, ROUTINE W REFLEX MICROSCOPIC - Abnormal; Notable for the following components:      Result Value   Color, Urine RED (*)    APPearance TURBID (*)    Glucose, UA   (*)    Value: TEST NOT REPORTED DUE TO COLOR INTERFERENCE OF URINE PIGMENT   Hgb urine dipstick   (*)    Value: TEST NOT REPORTED DUE TO COLOR INTERFERENCE OF URINE PIGMENT   Bilirubin Urine   (*)    Value: TEST NOT REPORTED DUE TO COLOR INTERFERENCE OF URINE PIGMENT   Ketones, ur   (*)    Value: TEST NOT REPORTED DUE TO COLOR INTERFERENCE OF URINE PIGMENT   Protein, ur   (*)    Value: TEST NOT REPORTED DUE TO COLOR INTERFERENCE OF URINE PIGMENT   Nitrite   (*)    Value: TEST NOT REPORTED DUE TO COLOR INTERFERENCE OF URINE PIGMENT   Leukocytes,Ua   (*)    Value: TEST NOT REPORTED DUE TO COLOR INTERFERENCE OF URINE PIGMENT   Bacteria, UA MANY (*)    All other components within normal limits  BASIC METABOLIC PANEL WITH GFR - Abnormal; Notable for the  following components:   Glucose, Bld 122 (*)    All other components within normal limits  CBC WITH DIFFERENTIAL/PLATELET - Abnormal; Notable for the following components:   Neutro Abs 8.4 (*)    All other components within normal limits  URINE CULTURE  CK     EKG     RADIOLOGY I independently reviewed and interpreted imaging and agree with radiologists findings.     PROCEDURES:  Critical Care performed:   Procedures   MEDICATIONS ORDERED IN ED: Medications - No data to display   IMPRESSION / MDM / ASSESSMENT AND PLAN / ED COURSE  I reviewed the triage vital signs and the nursing notes.   Differential diagnosis includes, but is not limited to, urinary tract infection, ureteral stone, hemorrhagic cystitis.  Patient is awake and alert, hemodynamically stable afebrile.  He is nontoxic in appearance.  He has no reproducible abdominal tenderness, no CVA tenderness or flank pain on exam.  Further workup is indicated.  Labs obtained are overall reassuring including normal creatinine, no leukocytosis, normal CPK.  Urinalysis reveals significant blood, as well as greater than 50 WBCs and many bacteria.  CT renal stone obtained for evaluation of any stone or other suspicious mass was negative for any acute findings.  Urine was sent for culture.  Will treat for hemorrhagic cystitis.  Recommended outpatient follow-up with urology and the appropriate follow-up information was provided.  We also discussed return precautions in the meantime.  Patient understands and agrees with plan.  Discharged in stable condition.  Patient's presentation is most consistent with acute complicated illness / injury requiring diagnostic workup.   Clinical Course as of 06/20/24 1357  Wed Jun 20, 2024  1224 PVR 2 [JP]    Clinical Course User Index [JP] Laylia Mui E, PA-C     FINAL CLINICAL IMPRESSION(S) / ED DIAGNOSES   Final diagnoses:  Lower urinary tract infectious disease     Rx / DC  Orders   ED Discharge Orders          Ordered    cefdinir  (OMNICEF ) 300 MG capsule  2 times daily        06/20/24 1330             Note:  This document was prepared using Dragon voice recognition software and may include unintentional dictation errors.   Kaled Allende E, PA-C 06/20/24 1358    Suzanne Kirsch, MD 06/20/24 [phone]

## 2024-06-20 NOTE — Discharge Instructions (Addendum)
 Please take antibiotics as prescribed.  Please follow-up with Dr. Francisca with urology.  Please return for any new, worsening, or change in symptoms or other concerns.  It was a pleasure caring for you today.

## 2024-06-23 LAB — URINE CULTURE: Culture: >=100000 — AB

## 2024-08-25 NOTE — Patient Instructions (Incomplete)
 Be Involved in Caring For Your Health:  Taking Medications When medications are taken as directed, they can greatly improve your health. But if they are not taken as prescribed, they may not work. In some cases, not taking them correctly can be harmful. To help ensure your treatment remains effective and safe, understand your medications and how to take them. Bring your medications to each visit for review by your provider.  Your lab results, notes, and after visit summary will be available on My Chart. We strongly encourage you to use this feature. If lab results are abnormal the clinic will contact you with the appropriate steps. If the clinic does not contact you assume the results are satisfactory. You can always view your results on My Chart. If you have questions regarding your health or results, please contact the clinic during office hours. You can also ask questions on My Chart.  We at [facility] are grateful that you chose [place] to provide your care. We strive to provide evidence-based and compassionate care and are always looking for feedback. If you get a survey from the clinic please complete this so we can hear your opinions.  Prediabetes: What to Know Prediabetes is when your blood sugar, also called glucose, is at a higher level than normal but not high enough for you to be diagnosed with type 2 diabetes (type 2 diabetes mellitus). Having prediabetes puts you at risk for getting type 2 diabetes. By making some healthy changes, you may be able to prevent or delay getting type 2 diabetes. This is important because type 2 diabetes can lead to serious problems. Some of these include: Heart disease. Stroke. Blindness. Kidney disease. Depression. Poor blood flow in the feet and legs. In very bad cases, this could lead to having a leg removed by surgery (amputation). What are the causes? The exact cause of prediabetes isn't known. It may result from insulin resistance. Insulin  resistance happens when cells in the body don't respond properly to insulin that the body makes. This can cause too much sugar to build up in the blood. High blood sugar, also called hyperglycemia, can develop. What increases the risk? Having a family member with type 2 diabetes. Being older than 74 years of age. Having had a temporary form of diabetes during a pregnancy. This is called gestational diabetes. Having had polycystic ovary syndrome (PCOS). Being overweight or obese. Being inactive and not getting much exercise. Having a history of heart disease. This may include problems with cholesterol levels, high levels of blood fats, or high blood pressure. What are the signs or symptoms? You may have no symptoms. If you do have symptoms, they may include: Increased hunger. Increased thirst. Needing to pee more often. Changes in how you see, like blurry vision. Feeling tired. How is this diagnosed? Prediabetes can be diagnosed with blood tests that check your blood sugar. One or more of these tests may be done: A fasting blood glucose (FBG) test. You won't be allowed to eat (you will fast) for at least 8 hours before a blood sample is taken. An A1C blood test, also called a hemoglobin A1C test. This test shows information about blood sugar levels over the past 2?3 months. An oral glucose tolerance test (OGTT). This test measures your blood sugar at two points in time: After you haven't eaten for a while. This is your baseline level. Two hours after you drink a beverage that has sugar in it. You may be diagnosed with prediabetes  if: Your FBG is 100?125 mg/dL (1.6-1.0 mmol/L). Your A1C level is 5.7?6.4% (39-46 mmol/mol). Your OGTT result is 140?199 mg/dL (9.6-04 mmol/L). These blood tests may need to be done again to be sure of the diagnosis. How is this treated? Treatment may include making changes to your diet and lifestyle. These changes can help lower your blood sugar and keep you  from getting type 2 diabetes. In some cases, medicine may be given to help lower your risk. Follow these instructions at home: Eating and drinking  Eat and drink as told. Follow a healthy meal plan. This includes eating lean proteins, whole grains, legumes, fresh fruits and vegetables, low-fat dairy products, and healthy fats. Meet with an expert in healthy eating called a dietitian. This person can help create a healthy eating plan that's right for you. Lifestyle Do moderate-intensity exercise. Do this for at least 30 minutes a day on 5 or more days each week, or as told by your health care provider. A mix of activities may be best. Good choices include brisk walking, swimming, biking, and weight lifting. Try to lose weight if your provider says it's OK. Losing 5-7% of your body weight can help reverse insulin resistance. Do not drink alcohol if: Your provider tells you not to drink. You're pregnant, may be pregnant, or plan to become pregnant. If you drink alcohol: Limit how much you have to: 0-1 drink a day if you're male. 0-2 drinks a day if you're male. Know how much alcohol is in your drink. In the [place] one drink is one 12 oz bottle of beer (355 mL), one 5 oz glass of wine (148 mL), or one 1 oz glass of hard liquor (44 mL). General instructions Take medicines only as told. You may be given medicines that help lower the risk of type 2 diabetes. Do not smoke, vape, or use nicotine or tobacco. Where to find more information American Diabetes Association: diabetes.org/about-diabetes/prediabetes Academy of Nutrition and Dietetics: eatright.org American Heart Association: Go to ThisJobs.cz. Click the search icon. Type "prediabetes" in the search box. Contact a health care provider if: You have any of these symptoms: Increased hunger. Peeing more often than usual. Increased thirst. Feeling tired. Changes in how you see, like blurry vision. Feeling like you may throw  up. Throwing up. Get help right away if: You have shortness of breath. You feel confused. This information is not intended to replace advice given to you by your health care provider. Make sure you discuss any questions you have with your health care provider. Document Revised: 05/01/2023 Document Reviewed: 05/01/2023 Elsevier Patient Education  2024 [employer].

## 2024-08-27 ENCOUNTER — Ambulatory Visit: Admitting: Nurse Practitioner

## 2024-08-27 DIAGNOSIS — E663 Overweight: Secondary | ICD-10-CM

## 2024-08-27 DIAGNOSIS — R7309 Other abnormal glucose: Secondary | ICD-10-CM

## 2024-08-27 DIAGNOSIS — E782 Mixed hyperlipidemia: Secondary | ICD-10-CM

## 2024-08-27 DIAGNOSIS — Z23 Encounter for immunization: Secondary | ICD-10-CM

## 2024-08-27 DIAGNOSIS — I10 Essential (primary) hypertension: Secondary | ICD-10-CM

## 2024-08-28 ENCOUNTER — Ambulatory Visit (INDEPENDENT_AMBULATORY_CARE_PROVIDER_SITE_OTHER): Payer: Self-pay | Admitting: Emergency Medicine

## 2024-08-28 VITALS — Ht 68.0 in | Wt 180.0 lb

## 2024-08-28 DIAGNOSIS — Z0000 Encounter for general adult medical examination without abnormal findings: Secondary | ICD-10-CM | POA: Diagnosis not present

## 2024-08-28 NOTE — Progress Notes (Signed)
 Chief Complaint  Patient presents with   Medicare Wellness     Subjective:   Jared Herrera is a 73 y.o. male who presents for a Medicare Annual Wellness Visit.  Allergies (verified) Patient has no known allergies.   History: Past Medical History:  Diagnosis Date   Cancer [facility])    Prostate   Cataract    bilateral   Hyperlipidemia    Hypertension    controlled on meds   Sinus drainage    Past Surgical History:  Procedure Laterality Date   COLONOSCOPY WITH PROPOFOL  N/A 04/05/2016   Procedure: COLONOSCOPY WITH PROPOFOL ;  Surgeon: Rogelia Copping, MD;  Location: [facility];  Service: Endoscopy;  Laterality: N/A;   PROSTATE BIOPSY  March 2017   showed some cells, but they are slow growing   RADIOACTIVE SEED IMPLANT N/A 02/01/2017   Procedure: RADIOACTIVE SEED IMPLANT/BRACHYTHERAPY IMPLANT;  Surgeon: Rosina Riis, MD;  Location: [facility];  Service: Urology;  Laterality: N/A;   TONSILLECTOMY AND ADENOIDECTOMY     WISDOM TOOTH EXTRACTION     Family History  Problem Relation Age of Onset   Hypertension Mother    Kidney disease Mother    Dementia Mother    Cancer Sister        thyroid    Kidney disease Brother    Heart disease Brother    Hypertension Brother    Lupus Daughter    Kidney disease Daughter    COPD Neg Hx    Diabetes Neg Hx    Stroke Neg Hx    Social History   Occupational History   Occupation: retired  Tobacco Use   Smoking status: Former    Current packs/day: 0.00    Average packs/day: 0.5 packs/day for 8.0 years (4.0 ttl pk-yrs)    Types: Cigarettes    Start date: 10/11/1970    Quit date: 10/11/1978    Years since quitting: 45.9   Smokeless tobacco: Never  Vaping Use   Vaping status: Never Used  Substance and Sexual Activity   Alcohol use: Not Currently    Alcohol/week: 0.0 standard drinks of alcohol    Comment: occasional glass of wine   Drug use: No   Sexual activity: Not Currently   Tobacco Counseling Counseling given: Not  Answered  SDOH Screenings   Food Insecurity: No Food Insecurity (08/28/2024)  Housing: Low Risk  (08/28/2024)  Transportation Needs: No Transportation Needs (08/28/2024)  Utilities: Not At Risk (08/28/2024)  Alcohol Screen: Low Risk  (08/26/2023)  Depression (PHQ2-9): Low Risk  (08/28/2024)  Financial Resource Strain: Low Risk  (08/26/2023)  Physical Activity: Inactive (08/28/2024)  Social Connections: Moderately Isolated (08/28/2024)  Stress: No Stress Concern Present (08/28/2024)  Tobacco Use: Medium Risk (08/28/2024)  Health Literacy: Adequate Health Literacy (08/28/2024)   See flowsheets for full screening details  Depression Screen PHQ 2 & 9 Depression Scale- Over the past 2 weeks, how often have you been bothered by any of the following problems? Little interest or pleasure in doing things: 0 Feeling down, depressed, or hopeless (PHQ Adolescent also includes...irritable): 0 PHQ-2 Total Score: 0 Trouble falling or staying asleep, or sleeping too much: 0 Feeling tired or having little energy: 0 Poor appetite or overeating (PHQ Adolescent also includes...weight loss): 0 Feeling bad about yourself - or that you are a failure or have let yourself or your family down: 0 Trouble concentrating on things, such as reading the newspaper or watching television (PHQ Adolescent also includes...like school work): 0 Moving or speaking so  slowly that other people could have noticed. Or the opposite - being so fidgety or restless that you have been moving around a lot more than usual: 0 Thoughts that you would be better off dead, or of hurting yourself in some way: 0 PHQ-9 Total Score: 0 If you checked off any problems, how difficult have these problems made it for you to do your work, take care of things at home, or get along with other people?: Not difficult at all  Depression Treatment Depression Interventions/Treatment : EYV7-0 Score <4 Follow-up Not Indicated     Goals Addressed                This Visit's Progress     have dental work completed (pt-stated)        COMPLETED: Patient Stated        05/26/2020, wants to weigh 180 pounds      COMPLETED: Patient Stated        06/01/2021, wants to weigh 185 pounds      COMPLETED: Weight (lb) < 200 lb (90.7 kg)   180 lb (81.6 kg)      Visit info / Clinical Intake: Medicare Wellness Visit Type:: Subsequent Annual Wellness Visit Persons participating in visit:: patient Medicare Wellness Visit Mode:: Telephone If telephone:: video declined Because this visit was a virtual/telehealth visit:: pt reported vitals If Telephone or Video please confirm:: I connected with the patient using audio enabled telemedicine application and verified that I am speaking with the correct person using two identifiers; I discussed the limitations of evaluation and management by telemedicine; The patient expressed understanding and agreed to proceed Patient Location:: home Provider Location:: home Information given by:: patient Interpreter Needed?: No Pre-visit prep was completed: yes AWV questionnaire completed by patient prior to visit?: no Living arrangements:: (!) lives alone Patient's Overall Health Status Rating: very good Typical amount of pain: some Does pain affect daily life?: no Are you currently prescribed opioids?: no  Dietary Habits and Nutritional Risks How many meals a day?: 3 Eats fruit and vegetables daily?: yes Most meals are obtained by: eating out In the last 2 weeks, have you had any of the following?: none Diabetic:: no  Functional Status Activities of Daily Living (to include ambulation/medication): Independent Ambulation: Independent Medication Administration: Independent Home Management: Independent Manage your own finances?: yes Primary transportation is: driving Concerns about vision?: no *vision screening is required for WTM* Concerns about hearing?: no  Fall Screening Falls in the past year?:  0 Number of falls in past year: 0 Was there an injury with Fall?: 0 Fall Risk Category Calculator: 0 Patient Fall Risk Level: Low Fall Risk  Fall Risk Patient at Risk for Falls Due to: No Fall Risks Fall risk Follow up: Falls evaluation completed; Education provided  Home and Transportation Safety: All rugs have non-skid backing?: yes All stairs or steps have railings?: N/A, no stairs Grab bars in the bathtub or shower?: (!) no Have non-skid surface in bathtub or shower?: yes Good home lighting?: yes Regular seat belt use?: yes Hospital stays in the last year:: no  Cognitive Assessment Difficulty concentrating, remembering, or making decisions? : no Will 6CIT or Mini Cog be Completed: yes What year is it?: 0 points What month is it?: 0 points Give patient an address phrase to remember (5 components): [address] [place] About what time is it?: 0 points Count backwards from 20 to 1: 0 points Say the months of the year in reverse: 0 points Repeat  the address phrase from earlier: 0 points 6 CIT Score: 0 points  Advance Directives (For Healthcare) Does Patient Have a Medical Advance Directive?: Yes Does patient want to make changes to medical advance directive?: No - Patient declined Type of Advance Directive: Healthcare Power of [place]; Living will Copy of Healthcare Power of Attorney in Chart?: No - copy requested Copy of Living Will in Chart?: No - copy requested Would patient like information on creating a medical advance directive?: No - Patient declined  Reviewed/Updated  Reviewed/Updated: Reviewed All (Medical, Surgical, Family, Medications, Allergies, Care Teams, Patient Goals)        Objective:    Today's Vitals   08/28/24 1459  Weight: 180 lb (81.6 kg)  Height: 5' 8 (1.727 m)   Body mass index is 27.37 kg/m.  Current Medications (verified) Outpatient Encounter Medications as of 08/28/2024  Medication Sig   Ascorbic Acid (VITAMIN C) 1000 MG tablet  Take 1,000 mg by mouth daily.   aspirin 81 MG tablet Take 81 mg by mouth daily. Am, Reported on 11/11/2015   atorvastatin  (LIPITOR) 10 MG tablet Take 1 tablet (10 mg total) by mouth daily.   Cholecalciferol (VITAMIN D3 PO) Take 1,000 Units by mouth daily.   lisinopril -hydrochlorothiazide  (ZESTORETIC ) 20-25 MG tablet Take 1 tablet by mouth daily.   Multiple Vitamin (MULTIVITAMIN) tablet Take 1 tablet by mouth daily.   vitamin B-12 (CYANOCOBALAMIN) 1000 MCG tablet Take 1,000 mcg by mouth daily.   No facility-administered encounter medications on file as of 08/28/2024.   Hearing/Vision screen Hearing Screening - Comments:: Denies hearing loss  Vision Screening - Comments:: Needs routine eye exam. Included list of eye doctors in [facility] Immunizations and Health Maintenance Health Maintenance  Topic Date Due   Colonoscopy  04/05/2021   Influenza Vaccine  05/11/2024   COVID-19 Vaccine (8 - 2025-26 season) 09/10/2024 (Originally 06/11/2024)   Zoster Vaccines- Shingrix (1 of 2) 11/25/2024 (Originally 11/02/1999)   Medicare Annual Wellness (AWV)  08/28/2025   DTaP/Tdap/Td (3 - Tdap) 08/23/2032   Pneumococcal Vaccine: 50+ Years  Completed   Hepatitis C Screening  Completed   Meningococcal B Vaccine  Aged Out        Assessment/Plan:  This is a routine wellness examination for [place].  Patient Care Team: Cannady, Jolene T, NP as PCP - General (Nurse Practitioner)  I have personally reviewed and noted the following in the patient's chart:   Medical and social history Use of alcohol, tobacco or illicit drugs  Current medications and supplements including opioid prescriptions. Functional ability and status Nutritional status Physical activity Advanced directives List of other physicians Hospitalizations, surgeries, and ER visits in previous 12 months Vitals Screenings to include cognitive, depression, and falls Referrals and appointments  No orders of the defined types were placed in this  encounter.  In addition, I have reviewed and discussed with patient certain preventive protocols, quality metrics, and best practice recommendations. A written personalized care plan for preventive services as well as general preventive health recommendations were provided to patient.   Vina Ned, CMA   08/28/2024   Return in 1 year (on 08/29/2025).  After Visit Summary: (Mail) Due to this being a telephonic visit, the after visit summary with patients personalized plan was offered to patient via mail   Nurse Notes:  RS OV from 08/27/24 to 08/30/24. Patient wants to get flu and covid vaccine at next OV on 08/30/24. Needs Shingles vaccine (pharmacy) Needs routine eye exam. Included a list of eye doctors in  [facility]. Declined colonoscopy at this time.

## 2024-08-28 NOTE — Patient Instructions (Signed)
 Jared Herrera,  Thank you for taking the time for your Medicare Wellness Visit. I appreciate your continued commitment to your health goals. Please review the care plan we discussed, and feel free to reach out if I can assist you further.  Please note that Annual Wellness Visits do not include a physical exam. Some assessments may be limited, especially if the visit was conducted virtually. If needed, we may recommend an in-person follow-up with your provider.  Ongoing Care Seeing your primary care provider every 3 to 6 months helps us  monitor your health and provide consistent, personalized care.   Referrals If a referral was made during today's visit and you haven't received any updates within two weeks, please contact the referred provider directly to check on the status.  Recommended Screenings:  You may get the flu vaccine and covid vaccine at your doctor's office at your next OV on 08/30/24.  You may get the shingles vaccines at your local pharmacy at your convenience.  Get a routine eye exam every 1-2 years. I have included a list of eye doctors in the area.    Health Maintenance  Topic Date Due   Colon Cancer Screening  04/05/2021   Flu Shot  05/11/2024   Medicare Annual Wellness Visit  08/25/2024   COVID-19 Vaccine (8 - 2025-26 season) 09/10/2024*   Zoster (Shingles) Vaccine (1 of 2) 11/25/2024*   DTaP/Tdap/Td vaccine (3 - Tdap) 08/23/2032   Pneumococcal Vaccine for age over 52  Completed   Hepatitis C Screening  Completed   Meningitis B Vaccine  Aged Out  *Topic was postponed. The date shown is not the original due date.       08/28/2024    3:07 PM  Advanced Directives  Does Patient Have a Medical Advance Directive? Yes  Type of Estate Agent of [place];Living will  Does patient want to make changes to medical advance directive? No - Patient declined  Copy of Healthcare Power of Attorney in Chart? No - copy requested    Vision: Annual  vision screenings are recommended for early detection of glaucoma, cataracts, and diabetic retinopathy. These exams can also reveal signs of chronic conditions such as diabetes and high blood pressure.  Dental: Annual dental screenings help detect early signs of oral cancer, gum disease, and other conditions linked to overall health, including heart disease and diabetes.  Please see the attached documents for additional preventive care recommendations.   There are several Eye Doctors in your area. Here are a few that usually accept all insurance types:  [facility] [address] [place], [place] [address] Phone: [phone]  [facility] [address] [place], [place] [address] Phone: [phone]  [facility] [address] [place], [place] [address] Phone: [phone]  [facility] [address] [place], [place] [address] Phone: [phone]  [facility] [address] [place], [place] [address] Phone: [phone]  [facility] [address] [place], [place] [address] Phone: [phone]  Fall Prevention in the Home, Adult Falls can cause injuries and affect people of all ages. There are many simple things that you can do to make your home safe and to help prevent falls. If you need it, ask for help making these changes. What actions can I take to prevent falls? General information Use good lighting in all rooms. Make sure to: Replace any light bulbs that burn out. Turn on lights if it is dark and use night-lights. Keep items that  you use often in easy-to-reach places. Lower the shelves around your home if needed. Move furniture so that there are clear paths around it. Do not keep throw rugs or other things on the floor that can make you trip. If any of your floors are uneven, fix them. Add color or contrast paint or tape to clearly mark and help you see: Grab bars or handrails. First and last steps of staircases. Where the edge of each step  is. If you use a ladder or stepladder: Make sure that it is fully opened. Do not climb a closed ladder. Make sure the sides of the ladder are locked in place. Have someone hold the ladder while you use it. Know where your pets are as you move through your home. What can I do in the bathroom?     Keep the floor dry. Clean up any water  that is on the floor right away. Remove soap buildup in the bathtub or shower. Buildup makes bathtubs and showers slippery. Use non-skid mats or decals on the floor of the bathtub or shower. Attach bath mats securely with double-sided, non-slip rug tape. If you need to sit down while you are in the shower, use a non-slip stool. Install grab bars by the toilet and in the bathtub and shower. Do not use towel bars as grab bars. What can I do in the bedroom? Make sure that you have a light by your bed that is easy to reach. Do not use any sheets or blankets on your bed that hang to the floor. Have a firm bench or chair with side arms that you can use for support when you get dressed. What can I do in the kitchen? Clean up any spills right away. If you need to reach something above you, use a sturdy step stool that has a grab bar. Keep electrical cables out of the way. Do not use floor polish or wax that makes floors slippery. What can I do with my stairs? Do not leave anything on the stairs. Make sure that you have a light switch at the top and the bottom of the stairs. Have them installed if you do not have them. Make sure that there are handrails on both sides of the stairs. Fix handrails that are broken or loose. Make sure that handrails are as long as the staircases. Install non-slip stair treads on all stairs in your home if they do not have carpet. Avoid having throw rugs at the top or bottom of stairs, or secure the rugs with carpet tape to prevent them from moving. Choose a carpet design that does not hide the edge of steps on the stairs. Make sure  that carpet is firmly attached to the stairs. Fix any carpet that is loose or worn. What can I do on the outside of my home? Use bright outdoor lighting. Repair the edges of walkways and driveways and fix any cracks. Clear paths of anything that can make you trip, such as tools or rocks. Add color or contrast paint or tape to clearly mark and help you see high doorway thresholds. Trim any bushes or trees on the main path into your home. Check that handrails are securely fastened and in good repair. Both sides of all steps should have handrails. Install guardrails along the edges of any raised decks or porches. Have leaves, snow, and ice cleared regularly. Use sand, salt, or ice melt on walkways during winter months if you live where there is ice and  snow. In the garage, clean up any spills right away, including grease or oil spills. What other actions can I take? Review your medicines with your health care provider. Some medicines can make you confused or feel dizzy. This can increase your chance of falling. Wear closed-toe shoes that fit well and support your feet. Wear shoes that have rubber soles and low heels. Use a cane, walker, scooter, or crutches that help you move around if needed. Talk with your provider about other ways that you can decrease your risk of falls. This may include seeing a physical therapist to learn to do exercises to improve movement and strength. Where to find more information Centers for Disease Control and Prevention, STEADI: tonerpromos.no [employer] on Aging: baseringtones.pl National Institute on Aging: baseringtones.pl Contact a health care provider if: You are afraid of falling at home. You feel weak, drowsy, or dizzy at home. You fall at home. Get help right away if you: Lose consciousness or have trouble moving after a fall. Have a fall that causes a head injury. These symptoms may be an emergency. Get help right away. Call 911. Do not wait to see if the  symptoms will go away. Do not drive yourself to the hospital. This information is not intended to replace advice given to you by your health care provider. Make sure you discuss any questions you have with your health care provider. Document Revised: 05/31/2022 Document Reviewed: 05/31/2022 Elsevier Patient Education  2024 [employer].

## 2024-08-30 ENCOUNTER — Ambulatory Visit: Admitting: Nurse Practitioner

## 2024-08-30 ENCOUNTER — Encounter: Payer: Self-pay | Admitting: Nurse Practitioner

## 2024-08-30 VITALS — BP 138/78 | HR 78 | Temp 98.9°F | Resp 17 | Ht 67.99 in | Wt 182.0 lb

## 2024-08-30 DIAGNOSIS — Z23 Encounter for immunization: Secondary | ICD-10-CM

## 2024-08-30 DIAGNOSIS — E663 Overweight: Secondary | ICD-10-CM | POA: Diagnosis not present

## 2024-08-30 DIAGNOSIS — E782 Mixed hyperlipidemia: Secondary | ICD-10-CM | POA: Diagnosis not present

## 2024-08-30 DIAGNOSIS — R7309 Other abnormal glucose: Secondary | ICD-10-CM

## 2024-08-30 DIAGNOSIS — Z8546 Personal history of malignant neoplasm of prostate: Secondary | ICD-10-CM | POA: Diagnosis not present

## 2024-08-30 DIAGNOSIS — I10 Essential (primary) hypertension: Secondary | ICD-10-CM

## 2024-08-30 NOTE — Assessment & Plan Note (Signed)
 Stable -- followed by oncology in past and to return as needed.  PSA at physical annually.

## 2024-08-30 NOTE — Patient Instructions (Signed)
 Be Involved in Caring For Your Health:  Taking Medications When medications are taken as directed, they can greatly improve your health. But if they are not taken as prescribed, they may not work. In some cases, not taking them correctly can be harmful. To help ensure your treatment remains effective and safe, understand your medications and how to take them. Bring your medications to each visit for review by your provider.  Your lab results, notes, and after visit summary will be available on My Chart. We strongly encourage you to use this feature. If lab results are abnormal the clinic will contact you with the appropriate steps. If the clinic does not contact you assume the results are satisfactory. You can always view your results on My Chart. If you have questions regarding your health or results, please contact the clinic during office hours. You can also ask questions on My Chart.  We at [facility] are grateful that you chose us  to provide your care. We strive to provide evidence-based and compassionate care and are always looking for feedback. If you get a survey from the clinic please complete this so we can hear your opinions.  DASH Eating Plan DASH stands for Dietary Approaches to Stop Hypertension. The DASH eating plan is a healthy eating plan that has been shown to: Lower high blood pressure (hypertension). Reduce your risk for type 2 diabetes, heart disease, and stroke. Help with weight loss. What are tips for following this plan? Reading food labels Check food labels for the amount of salt (sodium) per serving. Choose foods with less than 5 percent of the Daily Value (DV) of sodium. In general, foods with less than 300 milligrams (mg) of sodium per serving fit into this eating plan. To find whole grains, look for the word whole as the first word in the ingredient list. Shopping Buy products labeled as low-sodium or no salt added. Buy fresh foods. Avoid canned  foods and pre-made or frozen meals. Cooking Try not to add salt when you cook. Use salt-free seasonings or herbs instead of table salt or sea salt. Check with your health care provider or pharmacist before using salt substitutes. Do not fry foods. Cook foods in healthy ways, such as baking, boiling, grilling, roasting, or broiling. Cook using oils that are good for your heart. These include olive, canola, avocado, soybean, and sunflower oil. Meal planning  Eat a balanced diet. This should include: 4 or more servings of fruits and 4 or more servings of vegetables each day. Try to fill half of your plate with fruits and vegetables. 6-8 servings of whole grains each day. 6 or less servings of lean meat, poultry, or fish each day. 1 oz is 1 serving. A 3 oz (85 g) serving of meat is about the same size as the palm of your hand. One egg is 1 oz (28 g). 2-3 servings of low-fat dairy each day. One serving is 1 cup (237 mL). 1 serving of nuts, seeds, or beans 5 times each week. 2-3 servings of heart-healthy fats. Healthy fats called omega-3 fatty acids are found in foods such as walnuts, flaxseeds, fortified milks, and eggs. These fats are also found in cold-water fish, such as sardines, salmon, and mackerel. Limit how much you eat of: Canned or prepackaged foods. Food that is high in trans fat, such as fried foods. Food that is high in saturated fat, such as fatty meat. Desserts and other sweets, sugary drinks, and other foods with added sugar. Full-fat  dairy products. Do not salt foods before eating. Do not eat more than 4 egg yolks a week. Try to eat at least 2 vegetarian meals a week. Eat more home-cooked food and less restaurant, buffet, and fast food. Lifestyle When eating at a restaurant, ask if your food can be made with less salt or no salt. If you drink alcohol: Limit how much you have to: 0-1 drink a day if you are male. 0-2 drinks a day if you are male. Know how much alcohol is in  your drink. In the [place] one drink is one 12 oz bottle of beer (355 mL), one 5 oz glass of wine (148 mL), or one 1 oz glass of hard liquor (44 mL). General information Avoid eating more than 2,300 mg of salt a day. If you have hypertension, you may need to reduce your sodium intake to 1,500 mg a day. Work with your provider to stay at a healthy body weight or lose weight. Ask what the best weight range is for you. On most days of the week, get at least 30 minutes of exercise that causes your heart to beat faster. This may include walking, swimming, or biking. Work with your provider or dietitian to adjust your eating plan to meet your specific calorie needs. What foods should I eat? Fruits All fresh, dried, or frozen fruit. Canned fruits that are in their natural juice and do not have sugar added to them. Vegetables Fresh or frozen vegetables that are raw, steamed, roasted, or grilled. Low-sodium or reduced-sodium tomato and vegetable juice. Low-sodium or reduced-sodium tomato sauce and tomato paste. Low-sodium or reduced-sodium canned vegetables. Grains Whole-grain or whole-wheat bread. Whole-grain or whole-wheat pasta. Brown rice. Mcneil Madeira. Bulgur. Whole-grain and low-sodium cereals. Pita bread. Low-fat, low-sodium crackers. Whole-wheat flour tortillas. Meats and other proteins Skinless chicken or [place]. Ground chicken or [place]. Pork with fat trimmed off. Fish and seafood. Egg whites. Dried beans, peas, or lentils. Unsalted nuts, nut butters, and seeds. Unsalted canned beans. Lean cuts of beef with fat trimmed off. Low-sodium, lean precooked or cured meat, such as sausages or meat loaves. Dairy Low-fat (1%) or fat-free (skim) milk. Reduced-fat, low-fat, or fat-free cheeses. Nonfat, low-sodium ricotta or cottage cheese. Low-fat or nonfat yogurt. Low-fat, low-sodium cheese. Fats and oils Soft margarine without trans fats. Vegetable oil. Reduced-fat, low-fat, or light mayonnaise and salad  dressings (reduced-sodium). Canola, safflower, olive, avocado, soybean, and sunflower oils. Avocado. Seasonings and condiments Herbs. Spices. Seasoning mixes without salt. Other foods Unsalted popcorn and pretzels. Fat-free sweets. The items listed above may not be all the foods and drinks you can have. Talk to a dietitian to learn more. What foods should I avoid? Fruits Canned fruit in a light or heavy syrup. Fried fruit. Fruit in cream or butter sauce. Vegetables Creamed or fried vegetables. Vegetables in a cheese sauce. Regular canned vegetables that are not marked as low-sodium or reduced-sodium. Regular canned tomato sauce and paste that are not marked as low-sodium or reduced-sodium. Regular tomato and vegetable juices that are not marked as low-sodium or reduced-sodium. Dene. Olives. Grains Baked goods made with fat, such as croissants, muffins, or some breads. Dry pasta or rice meal packs. Meats and other proteins Fatty cuts of meat. Ribs. Fried meat. [place]. Bologna, salami, and other precooked or cured meats, such as sausages or meat loaves, that are not lean and low in sodium. Fat from the back of a pig (fatback). Bratwurst. Salted nuts and seeds. Canned beans with added salt. Canned  or smoked fish. Whole eggs or egg yolks. Chicken or [place] with skin. Dairy Whole or 2% milk, cream, and half-and-half. Whole or full-fat cream cheese. Whole-fat or sweetened yogurt. Full-fat cheese. Nondairy creamers. Whipped toppings. Processed cheese and cheese spreads. Fats and oils Butter. Stick margarine. Lard. Shortening. Ghee. Bacon fat. Tropical oils, such as coconut, palm kernel, or palm oil. Seasonings and condiments Onion salt, garlic salt, seasoned salt, table salt, and sea salt. Worcestershire sauce. Tartar sauce. Barbecue sauce. Teriyaki sauce. Soy sauce, including reduced-sodium soy sauce. Steak sauce. Canned and packaged gravies. Fish sauce. Oyster sauce. Cocktail sauce. Store-bought  horseradish. Ketchup. Mustard. Meat flavorings and tenderizers. Bouillon cubes. Hot sauces. Pre-made or packaged marinades. Pre-made or packaged taco seasonings. Relishes. Regular salad dressings. Other foods Salted popcorn and pretzels. The items listed above may not be all the foods and drinks you should avoid. Talk to a dietitian to learn more. Where to find more information National Heart, Lung, and Blood Institute (NHLBI): BuffaloDryCleaner.gl American Heart Association (AHA): heart.org Academy of Nutrition and Dietetics: eatright.org National Kidney Foundation (NKF): kidney.org This information is not intended to replace advice given to you by your health care provider. Make sure you discuss any questions you have with your health care provider. Document Revised: 10/14/2022 Document Reviewed: 10/14/2022 Elsevier Patient Education  2024 [employer].

## 2024-08-30 NOTE — Progress Notes (Signed)
 BP 138/78 (BP Location: Left Arm, Patient Position: Sitting, Cuff Size: Normal)   Pulse 78   Temp 98.9 F (37.2 C) (Oral)   Resp 17   Ht 5' 7.99 (1.727 m)   Wt 182 lb (82.6 kg)   SpO2 98%   BMI 27.68 kg/m    Subjective:    Patient ID: Jared Herrera, male    DOB: 1951-06-27, 73 y.o.   MRN: [id number]  HPI: Jared Herrera is a 73 y.o. male  Chief Complaint  Patient presents with   HTN/HLD    No concerns for it today however does not check at home.    HYPERTENSION / HYPERLIPIDEMIA Taking Zestoretic  for HTN and Atorvastatin  daily. Very active at baseline.  Radiation treatment in past (seeds placed in 2018) for prostate cancer >5 years ago and had follow-up with Dr. Lenn on 07/20/21.  Past smoker quit in 1985. Satisfied with current treatment? yes Duration of hypertension: chronic BP monitoring frequency: not checking BP range:  BP medication side effects: no Duration of hyperlipidemia: chronic Cholesterol medication side effects: no Cholesterol supplements: none Medication compliance: good compliance Aspirin: yes Recent stressors: no Recurrent headaches: no Visual changes: no Palpitations: no Dyspnea: no Chest pain: no Lower extremity edema: no Dizzy/lightheaded: no   Impaired Fasting Glucose HbA1C:  Lab Results  Component Value Date   HGBA1C 5.8 (H) 02/23/2024  Duration of elevated blood sugar: chronic Polydipsia: no Polyuria: no Weight change: no Visual disturbance: no Glucose Monitoring: no    Accucheck frequency: Not Checking    Fasting glucose:     Post prandial:  Diabetic Education: Not Completed Family history of diabetes: yes father's side   Relevant past medical, surgical, family and social history reviewed and updated as indicated. Interim medical history since our last visit reviewed. Allergies and medications reviewed and updated.  Review of Systems  Constitutional:  Negative for activity change, diaphoresis, fatigue and  fever.  Respiratory:  Negative for cough, chest tightness, shortness of breath and wheezing.   Cardiovascular:  Negative for chest pain, palpitations and leg swelling.  Gastrointestinal: Negative.   Endocrine: Negative for cold intolerance, heat intolerance, polydipsia, polyphagia and polyuria.  Neurological: Negative.   Psychiatric/Behavioral: Negative.     Per HPI unless specifically indicated above     Objective:    BP 138/78 (BP Location: Left Arm, Patient Position: Sitting, Cuff Size: Normal)   Pulse 78   Temp 98.9 F (37.2 C) (Oral)   Resp 17   Ht 5' 7.99 (1.727 m)   Wt 182 lb (82.6 kg)   SpO2 98%   BMI 27.68 kg/m   Wt Readings from Last 3 Encounters:  08/30/24 182 lb (82.6 kg)  08/28/24 180 lb (81.6 kg)  06/20/24 182 lb 12.2 oz (82.9 kg)    Physical Exam Vitals and nursing note reviewed.  Constitutional:      General: He is awake. He is not in acute distress.    Appearance: He is well-developed and overweight.  HENT:     Head: Normocephalic and atraumatic.     Right Ear: Hearing normal. No drainage.     Left Ear: Hearing normal. No drainage.  Eyes:     General: Lids are normal.        Right eye: No discharge.        Left eye: No discharge.     Conjunctiva/sclera: Conjunctivae normal.     Pupils: Pupils are equal, round, and reactive to light.  Neck:  Vascular: No carotid bruit.  Cardiovascular:     Rate and Rhythm: Normal rate and regular rhythm.     Heart sounds: Normal heart sounds, S1 normal and S2 normal. No murmur heard.    No gallop.  Pulmonary:     Effort: Pulmonary effort is normal. No accessory muscle usage or respiratory distress.     Breath sounds: Normal breath sounds.  Abdominal:     General: Bowel sounds are normal.     Palpations: Abdomen is soft.  Musculoskeletal:        General: Normal range of motion.     Cervical back: Normal range of motion and neck supple.     Right lower leg: No edema.     Left lower leg: No edema.  Skin:     General: Skin is warm and dry.  Neurological:     Mental Status: He is alert and oriented to person, place, and time.  Psychiatric:        Mood and Affect: Mood normal.        Behavior: Behavior normal. Behavior is cooperative.        Thought Content: Thought content normal.        Judgment: Judgment normal.    Results for orders placed or performed during the hospital encounter of 06/20/24  Urine Culture   Collection Time: 06/20/24 11:38 AM   Specimen: Urine, Clean Catch  Result Value Ref Range   Specimen Description      URINE, CLEAN CATCH Performed at [facility], [address] [place], [place] [address]    Special Requests      NONE Performed at [facility], [address] [place], [place] [address]    Culture (A)     >=100,000 COLONIES/mL ESCHERICHIA COLI 70,000 COLONIES/mL ENTEROCOCCUS FAECALIS    Report Status 06/23/2024 FINAL    Organism ID, Bacteria ESCHERICHIA COLI (A)    Organism ID, Bacteria ENTEROCOCCUS FAECALIS (A)       Susceptibility   Escherichia coli - MIC*    AMPICILLIN >=32 RESISTANT Resistant     CEFAZOLIN  (URINE) Value in next row Sensitive      4 SENSITIVEThis is a modified FDA-approved test that has been validated and its performance characteristics determined by the reporting laboratory.  This laboratory is certified under the Clinical Laboratory Improvement Amendments CLIA as qualified to perform high complexity clinical laboratory testing.    CEFEPIME Value in next row Sensitive      4 SENSITIVEThis is a modified FDA-approved test that has been validated and its performance characteristics determined by the reporting laboratory.  This laboratory is certified under the Clinical Laboratory Improvement Amendments CLIA as qualified to perform high complexity clinical laboratory testing.    ERTAPENEM Value in next row Sensitive      4 SENSITIVEThis is a modified FDA-approved test that has been validated and its performance  characteristics determined by the reporting laboratory.  This laboratory is certified under the Clinical Laboratory Improvement Amendments CLIA as qualified to perform high complexity clinical laboratory testing.    CEFTRIAXONE Value in next row Sensitive      4 SENSITIVEThis is a modified FDA-approved test that has been validated and its performance characteristics determined by the reporting laboratory.  This laboratory is certified under the Clinical Laboratory Improvement Amendments CLIA as qualified to perform high complexity clinical laboratory testing.    CIPROFLOXACIN  Value in next row Sensitive      4 SENSITIVEThis is a modified FDA-approved test that  has been validated and its performance characteristics determined by the reporting laboratory.  This laboratory is certified under the Clinical Laboratory Improvement Amendments CLIA as qualified to perform high complexity clinical laboratory testing.    GENTAMICIN  Value in next row Sensitive      4 SENSITIVEThis is a modified FDA-approved test that has been validated and its performance characteristics determined by the reporting laboratory.  This laboratory is certified under the Clinical Laboratory Improvement Amendments CLIA as qualified to perform high complexity clinical laboratory testing.    NITROFURANTOIN Value in next row Sensitive      4 SENSITIVEThis is a modified FDA-approved test that has been validated and its performance characteristics determined by the reporting laboratory.  This laboratory is certified under the Clinical Laboratory Improvement Amendments CLIA as qualified to perform high complexity clinical laboratory testing.    TRIMETH/SULFA Value in next row Sensitive      4 SENSITIVEThis is a modified FDA-approved test that has been validated and its performance characteristics determined by the reporting laboratory.  This laboratory is certified under the Clinical Laboratory Improvement Amendments CLIA as qualified to perform  high complexity clinical laboratory testing.    AMPICILLIN/SULBACTAM Value in next row Intermediate      4 SENSITIVEThis is a modified FDA-approved test that has been validated and its performance characteristics determined by the reporting laboratory.  This laboratory is certified under the Clinical Laboratory Improvement Amendments CLIA as qualified to perform high complexity clinical laboratory testing.    PIP/TAZO Value in next row Sensitive ug/mL     <=4 SENSITIVEThis is a modified FDA-approved test that has been validated and its performance characteristics determined by the reporting laboratory.  This laboratory is certified under the Clinical Laboratory Improvement Amendments CLIA as qualified to perform high complexity clinical laboratory testing.    MEROPENEM Value in next row Sensitive      <=4 SENSITIVEThis is a modified FDA-approved test that has been validated and its performance characteristics determined by the reporting laboratory.  This laboratory is certified under the Clinical Laboratory Improvement Amendments CLIA as qualified to perform high complexity clinical laboratory testing.    * >=100,000 COLONIES/mL ESCHERICHIA COLI   Enterococcus faecalis - MIC*    AMPICILLIN Value in next row Sensitive      <=4 SENSITIVEThis is a modified FDA-approved test that has been validated and its performance characteristics determined by the reporting laboratory.  This laboratory is certified under the Clinical Laboratory Improvement Amendments CLIA as qualified to perform high complexity clinical laboratory testing.    NITROFURANTOIN Value in next row Sensitive      <=4 SENSITIVEThis is a modified FDA-approved test that has been validated and its performance characteristics determined by the reporting laboratory.  This laboratory is certified under the Clinical Laboratory Improvement Amendments CLIA as qualified to perform high complexity clinical laboratory testing.    VANCOMYCIN Value in next  row Sensitive      <=4 SENSITIVEThis is a modified FDA-approved test that has been validated and its performance characteristics determined by the reporting laboratory.  This laboratory is certified under the Clinical Laboratory Improvement Amendments CLIA as qualified to perform high complexity clinical laboratory testing.    * 70,000 COLONIES/mL ENTEROCOCCUS FAECALIS  Urinalysis, Routine w reflex microscopic -Urine, Clean Catch   Collection Time: 06/20/24 11:38 AM  Result Value Ref Range   Color, Urine RED (A) YELLOW   APPearance TURBID (A) CLEAR   Specific Gravity, Urine  1.005 - 1.030    TEST NOT  REPORTED DUE TO COLOR INTERFERENCE OF URINE PIGMENT   pH  5.0 - 8.0    TEST NOT REPORTED DUE TO COLOR INTERFERENCE OF URINE PIGMENT   Glucose, UA (A) NEGATIVE mg/dL    TEST NOT REPORTED DUE TO COLOR INTERFERENCE OF URINE PIGMENT   Hgb urine dipstick (A) NEGATIVE    TEST NOT REPORTED DUE TO COLOR INTERFERENCE OF URINE PIGMENT   Bilirubin Urine (A) NEGATIVE    TEST NOT REPORTED DUE TO COLOR INTERFERENCE OF URINE PIGMENT   Ketones, ur (A) NEGATIVE mg/dL    TEST NOT REPORTED DUE TO COLOR INTERFERENCE OF URINE PIGMENT   Protein, ur (A) NEGATIVE mg/dL    TEST NOT REPORTED DUE TO COLOR INTERFERENCE OF URINE PIGMENT   Nitrite (A) NEGATIVE    TEST NOT REPORTED DUE TO COLOR INTERFERENCE OF URINE PIGMENT   Leukocytes,Ua (A) NEGATIVE    TEST NOT REPORTED DUE TO COLOR INTERFERENCE OF URINE PIGMENT   RBC / HPF >50 0 - 5 RBC/hpf   WBC, UA >50 0 - 5 WBC/hpf   Bacteria, UA MANY (A) NONE SEEN   Squamous Epithelial / HPF 0-5 0 - 5 /HPF   Mucus PRESENT    Hyaline Casts, UA PRESENT   Basic metabolic panel   Collection Time: 06/20/24 12:20 PM  Result Value Ref Range   Sodium 138 135 - 145 mmol/L   Potassium 3.5 3.5 - 5.1 mmol/L   Chloride 102 98 - 111 mmol/L   CO2 24 22 - 32 mmol/L   Glucose, Bld 122 (H) 70 - 99 mg/dL   BUN 10 8 - 23 mg/dL   Creatinine, Ser 9.30 0.61 - 1.24 mg/dL   Calcium  9.0 8.9 -  10.3 mg/dL   GFR, Estimated >39 >39 mL/min   Anion gap 12 5 - 15  CBC with Differential   Collection Time: 06/20/24 12:20 PM  Result Value Ref Range   WBC 10.0 4.0 - 10.5 K/uL   RBC 4.33 4.22 - 5.81 MIL/uL   Hemoglobin 13.6 13.0 - 17.0 g/dL   HCT 59.1 60.9 - 47.9 %   MCV 94.2 80.0 - 100.0 fL   MCH 31.4 26.0 - 34.0 pg   MCHC 33.3 30.0 - 36.0 g/dL   RDW 87.7 88.4 - 84.4 %   Platelets 244 150 - 400 K/uL   nRBC 0.0 0.0 - 0.2 %   Neutrophils Relative % 84 %   Neutro Abs 8.4 (H) 1.7 - 7.7 K/uL   Lymphocytes Relative 9 %   Lymphs Abs 0.9 0.7 - 4.0 K/uL   Monocytes Relative 7 %   Monocytes Absolute 0.7 0.1 - 1.0 K/uL   Eosinophils Relative 0 %   Eosinophils Absolute 0.0 0.0 - 0.5 K/uL   Basophils Relative 0 %   Basophils Absolute 0.0 0.0 - 0.1 K/uL   Immature Granulocytes 0 %   Abs Immature Granulocytes 0.03 0.00 - 0.07 K/uL  CK   Collection Time: 06/20/24 12:20 PM  Result Value Ref Range   Total CK 200 49 - 397 U/L      Assessment & Plan:   Problem List Items Addressed This Visit       Cardiovascular and Mediastinum   Hypertension - Primary   Chronic, stable.  BP below goal for age in office today.  Recommend he monitor BP at least a few mornings a week at home and document.  DASH diet at home.  Continue current medication regimen and adjust as needed.  Labs today:  CMP.  Urine ALB 09 Mar 2024. Refills as needed. Return in 6 months.            Other   Overweight   BMI 27.68.  Recommended eating smaller high protein, low fat meals more frequently and exercising 30 mins a day 5 times a week with a goal of 10-15lb weight loss in the next 3 months. Patient voiced their understanding and motivation to adhere to these recommendations.       Hyperlipemia   Chronic, ongoing.  Continue current medication regimen and adjust dose as needed.  Lipid panel today.        Relevant Orders   Lipid Panel w/o Chol/HDL Ratio   Comprehensive metabolic panel with GFR   History of  prostate cancer   Stable -- followed by oncology in past and to return as needed.  PSA at physical annually.      Elevated hemoglobin A1c measurement   Ongoing and stable.  A1c 5.8% last visit, recheck today.  Noted on past labs, focusing on diet changes and regular activity.  From 5.7% to 6.1% range -- continue current diet focus.  Urine ALB 09 Mar 2024.      Relevant Orders   HgB A1c   Other Visit Diagnoses       Flu vaccine need       Relevant Orders   Flu vaccine HIGH DOSE PF(Fluzone Trivalent) (Completed)         Follow up plan: Return in about 6 months (around 02/27/2025) for Annual Physical after 02/22/25.

## 2024-08-30 NOTE — Assessment & Plan Note (Signed)
Chronic, ongoing.  Continue current medication regimen and adjust dose as needed.  Lipid panel today.   

## 2024-08-30 NOTE — Assessment & Plan Note (Signed)
 BMI 27.68.  Recommended eating smaller high protein, low fat meals more frequently and exercising 30 mins a day 5 times a week with a goal of 10-15lb weight loss in the next 3 months. Patient voiced their understanding and motivation to adhere to these recommendations.

## 2024-08-30 NOTE — Assessment & Plan Note (Signed)
 Chronic, stable.  BP below goal for age in office today.  Recommend he monitor BP at least a few mornings a week at home and document.  DASH diet at home.  Continue current medication regimen and adjust as needed.  Labs today: CMP.  Urine ALB 09 Mar 2024. Refills as needed. Return in 6 months.

## 2024-08-30 NOTE — Assessment & Plan Note (Signed)
 Ongoing and stable.  A1c 5.8% last visit, recheck today.  Noted on past labs, focusing on diet changes and regular activity.  From 5.7% to 6.1% range -- continue current diet focus.  Urine ALB 09 Mar 2024.

## 2024-08-31 ENCOUNTER — Ambulatory Visit: Payer: Self-pay | Admitting: Nurse Practitioner

## 2024-08-31 LAB — COMPREHENSIVE METABOLIC PANEL WITH GFR
ALT: 23 IU/L (ref 0–44)
AST: 23 IU/L (ref 0–40)
Albumin: 4.2 g/dL (ref 3.8–4.8)
Alkaline Phosphatase: 78 IU/L (ref 47–123)
BUN/Creatinine Ratio: 21 (ref 10–24)
BUN: 13 mg/dL (ref 8–27)
Bilirubin Total: 0.8 mg/dL (ref 0.0–1.2)
CO2: 23 mmol/L (ref 20–29)
Calcium: 9.5 mg/dL (ref 8.6–10.2)
Chloride: 100 mmol/L (ref 96–106)
Creatinine, Ser: 0.63 mg/dL — ABNORMAL LOW (ref 0.76–1.27)
Globulin, Total: 2.9 g/dL (ref 1.5–4.5)
Glucose: 108 mg/dL — ABNORMAL HIGH (ref 70–99)
Potassium: 3.7 mmol/L (ref 3.5–5.2)
Sodium: 136 mmol/L (ref 134–144)
Total Protein: 7.1 g/dL (ref 6.0–8.5)
eGFR: 100 mL/min/1.73 (ref >59)

## 2024-08-31 LAB — HEMOGLOBIN A1C
Est. average glucose Bld gHb Est-mCnc: 128 mg/dL
Hgb A1c MFr Bld: 6.1 % — ABNORMAL HIGH (ref 4.8–5.6)

## 2024-08-31 LAB — LIPID PANEL W/O CHOL/HDL RATIO
Cholesterol, Total: 120 mg/dL (ref 100–199)
HDL: 44 mg/dL (ref >39)
LDL Chol Calc (NIH): 66 mg/dL (ref 0–99)
Triglycerides: 40 mg/dL (ref 0–149)
VLDL Cholesterol Cal: 10 mg/dL (ref 5–40)

## 2024-08-31 NOTE — Progress Notes (Signed)
 Contacted via MyChart  Good evening Jared Herrera, your labs have returned: - A1c continues to show prediabetes, with a little trend up. Focus heavily on diet changes and regular exercise. - Kidney function, creatinine and eGFR, remains normal, as is liver function, AST and ALT.  - Lipid panel stable. Any questions? Keep being amazing!!  Thank you for allowing me to participate in your care.  I appreciate you. Kindest regards, Timya Trimmer

## 2025-02-27 ENCOUNTER — Encounter: Admitting: Nurse Practitioner

## 2025-08-29 ENCOUNTER — Ambulatory Visit
